# Patient Record
Sex: Male | Born: 1950 | Race: White | Hispanic: Yes | Marital: Married | Smoking: Never smoker
Health system: Southern US, Community
[De-identification: ages and names within clinical notes are randomized; demographics above are authoritative.]

## PROBLEM LIST (undated history)

## (undated) ENCOUNTER — Emergency Department (HOSPITAL_COMMUNITY): Discharge status: Absent without leave | Payer: Self-pay

## (undated) DIAGNOSIS — N186 End stage renal disease: Secondary | ICD-10-CM

## (undated) DIAGNOSIS — E785 Hyperlipidemia, unspecified: Secondary | ICD-10-CM

## (undated) DIAGNOSIS — Z992 Dependence on renal dialysis: Secondary | ICD-10-CM

## (undated) DIAGNOSIS — I10 Essential (primary) hypertension: Secondary | ICD-10-CM

## (undated) HISTORY — PX: APPENDECTOMY: SHX54

## (undated) HISTORY — PX: HERNIA REPAIR: SHX51

## (undated) HISTORY — DX: Hyperlipidemia, unspecified: E78.5

---

## 2010-06-20 ENCOUNTER — Ambulatory Visit: Payer: Self-pay | Admitting: Internal Medicine

## 2010-06-20 ENCOUNTER — Ambulatory Visit (HOSPITAL_COMMUNITY)
Admission: RE | Admit: 2010-06-20 | Discharge: 2010-06-20 | Payer: Self-pay | Source: Home / Self Care | Admitting: Internal Medicine

## 2010-06-20 ENCOUNTER — Encounter: Payer: Self-pay | Admitting: Gastroenterology

## 2010-06-20 DIAGNOSIS — K439 Ventral hernia without obstruction or gangrene: Secondary | ICD-10-CM | POA: Insufficient documentation

## 2010-06-20 LAB — CONVERTED CEMR LAB: Creatinine, Ser: 0.74 mg/dL (ref 0.40–1.50)

## 2010-06-21 ENCOUNTER — Telehealth (INDEPENDENT_AMBULATORY_CARE_PROVIDER_SITE_OTHER): Payer: Self-pay

## 2010-06-21 ENCOUNTER — Encounter: Payer: Self-pay | Admitting: Gastroenterology

## 2010-06-26 ENCOUNTER — Encounter (INDEPENDENT_AMBULATORY_CARE_PROVIDER_SITE_OTHER): Payer: Self-pay

## 2010-07-02 ENCOUNTER — Telehealth (INDEPENDENT_AMBULATORY_CARE_PROVIDER_SITE_OTHER): Payer: Self-pay

## 2010-07-02 ENCOUNTER — Encounter: Payer: Self-pay | Admitting: Internal Medicine

## 2010-07-11 ENCOUNTER — Ambulatory Visit (HOSPITAL_COMMUNITY)
Admission: RE | Admit: 2010-07-11 | Discharge: 2010-07-11 | Payer: Self-pay | Source: Home / Self Care | Attending: Internal Medicine | Admitting: Internal Medicine

## 2010-07-14 ENCOUNTER — Encounter: Payer: Self-pay | Admitting: Internal Medicine

## 2010-08-21 NOTE — Letter (Signed)
Summary: TCS ORDERS  TCS ORDERS   Imported By: Hoy Morn 06/20/2010 14:58:57  _____________________________________________________________________  External Attachment:    Type:   Image     Comment:   External Document

## 2010-08-21 NOTE — Letter (Signed)
Summary: REFERRAL FROM [facility]  REFERRAL FROM [facility]   Imported By: Hoy Morn 06/21/2010 15:59:32  _____________________________________________________________________  External Attachment:    Type:   Image     Comment:   External Document

## 2010-08-21 NOTE — Letter (Signed)
Summary: Plan of Care, Need to [facility]  [address]   [place], [place] [address]   Phone: [phone]  Fax: [phone]    June 26, 2010  [place] [address]. [place], [place]  [address] 1950-09-30   Dear Mr. HEAPHY,   We are writing this letter to inform you of treatment plans and/or discuss your plan of care.  We have tried several times to contact you; however, we have yet to reach you.  We ask that you please contact our office for follow-up on your gastrointestinal issues.  We can  be reached at [phone] to schedule an appointment, or to speak with someone regarding your health care needs.  Please do not neglect your health.   Sincerely,    Waldon Merl LPN  [facility] Ph: [phone]    Fax: [phone]

## 2010-08-21 NOTE — Letter (Signed)
Summary: CT ABD/PEL ORDER  CT ABD/PEL ORDER   Imported By: Hoy Morn 06/20/2010 14:55:28  _____________________________________________________________________  External Attachment:    Type:   Image     Comment:   External Document

## 2010-08-21 NOTE — Progress Notes (Signed)
Summary: PT DOES NOT SPEAK ENGLISH/ SPEAK WITH DAUGHTER [place]  Phone Note Call from Patient   Summary of Call: NOTE: PER DAUGHTER, [place], WE MUST SPEAK WITH HER [phone], HER FATHER DOES NOT SPEAK ENGLISH! Initial call taken by: Waldon Merl LPN,  December  1, 624THL 3:36 PM

## 2010-08-22 ENCOUNTER — Other Ambulatory Visit: Payer: Self-pay | Admitting: General Surgery

## 2010-08-22 ENCOUNTER — Encounter (HOSPITAL_COMMUNITY): Payer: Self-pay | Attending: General Surgery

## 2010-08-22 DIAGNOSIS — Z01818 Encounter for other preprocedural examination: Secondary | ICD-10-CM | POA: Insufficient documentation

## 2010-08-22 DIAGNOSIS — Z01812 Encounter for preprocedural laboratory examination: Secondary | ICD-10-CM | POA: Insufficient documentation

## 2010-08-22 DIAGNOSIS — Z01810 Encounter for preprocedural cardiovascular examination: Secondary | ICD-10-CM | POA: Insufficient documentation

## 2010-08-22 LAB — BASIC METABOLIC PANEL
BUN: 12 mg/dL (ref 6–23)
Calcium: 9.4 mg/dL (ref 8.4–10.5)
Creatinine, Ser: 0.69 mg/dL (ref 0.4–1.5)
GFR calc non Af Amer: >60 mL/min (ref >60)

## 2010-08-22 LAB — CBC
Platelets: 222 10*3/uL (ref 150–400)
RDW: 12.5 % (ref 11.5–15.5)
WBC: 7.1 10*3/uL (ref 4.0–10.5)

## 2010-08-22 LAB — SURGICAL PCR SCREEN
MRSA, PCR: NEGATIVE
Staphylococcus aureus: NEGATIVE

## 2010-08-23 NOTE — Progress Notes (Signed)
Summary: Daughter, [place], Call back number [phone]  Phone Note Call from Patient   Caller: Daughter Summary of Call: Pt's daughter called and was given results of CT and all of the info. (See Ct report dated 06/20/2010) Her call back number is [phone].  Initial call taken by: Waldon Merl LPN,  December 12, 624THL 9:12 AM

## 2010-08-23 NOTE — Letter (Signed)
Summary: SURGICAL REFERRAL  SURGICAL REFERRAL   Imported By: Sofie Rower 07/02/2010 10:33:34  _____________________________________________________________________  External Attachment:    Type:   Image     Comment:   External Document  Appended Document: SURGICAL REFERRAL pts daughter aware of appt

## 2010-08-23 NOTE — Assessment & Plan Note (Signed)
Summary: CONSULT FOR TCS/SS   Visit Type:  Initial Consult Referring Provider:  [facility] Primary Care Provider:  [facility]  Chief Complaint:  consult for TCS.  History of Present Illness: [name] is a pleasant non-English speaking Hispanic male who presents at the request of the [name] for further evaluation of large abd wall hernia and need for screening TCS.   He had emergency appendectomy at [name] about 13 years ago for ruptured appendectomy. According to his daughter who provides translation, he had significant abd wall defect from having to let the wound heal from inside out. Two years later, the same surgeon repaired the hernia but according to the patient, it never was quite right. Over the years, the hernia has continued to increase in size. He denies abd pain. States his BMs are regular without melena or brbpr. Denies n/v, dysphagia, GERD. He was recently diagnosed with DMs and has had little intentional weight loss. No prior TCS. Denies recent imaging of his abdomen.  Current Medications (verified): 1)  Aspir-Low 81 Mg Tbec (Aspirin) .... Take 1 Tablet By Mouth Once A Day 2)  Lisinopril 5 Mg Tabs (Lisinopril) .... Take 1 Tablet By Mouth Once A Day 3)  Metformin Hcl 500 Mg Xr24h-Tab (Metformin Hcl) .... Take 1 Tablet By Mouth Two Times A Day 4)  Fish Oil .... One Tablet Twice Daily  Allergies (verified): No Known Drug Allergies  Past History:  Past Medical History: Diabetes Hypertension Hyperlipidemia  Past Surgical History: Appendectomy, ruptured appendix 13 years ago, did not close wound Hernia Surgery, 11 years ago, repaired by same surgeon   Family History: Negative for CRC, liver disease.  Social History: Married. Five children. Unemployed. Never smoked. No alcohol. No drug use.   Review of Systems General:  Denies fever, chills, sweats, anorexia, fatigue, weakness, and weight loss. Eyes:  Denies vision loss. ENT:  Denies  nasal congestion, sore throat, hoarseness, and difficulty swallowing. CV:  Denies chest pains, angina, palpitations, dyspnea on exertion, and peripheral edema. Resp:  Denies dyspnea at rest, dyspnea with exercise, cough, sputum, and wheezing. GI:  See HPI. GU:  Denies urinary burning and blood in urine. MS:  Denies joint pain / LOM and low back pain. Derm:  Denies rash and itching. Neuro:  Denies weakness, frequent headaches, memory loss, and confusion. Psych:  Denies depression and anxiety. Endo:  Denies unusual weight change. Heme:  Denies bruising and bleeding. Allergy:  Denies hives and rash.  Vital Signs:  Patient profile:   60 year old male Height:      69 inches Weight:      222 pounds BMI:     32.90 Temp:     97.5 degrees F oral Pulse rate:   64 / minute BP sitting:   140 / 98  (left arm) Cuff size:   regular  Vitals Entered By: Michael Merl LPN (November 30, 624THL 1:20 PM)  Physical Exam  General:  Well developed, well nourished, no acute distress. Head:  Normocephalic and atraumatic. Eyes:  Conjunctivae pink, no scleral icterus.  Mouth:  Oropharyngeal mucosa moist, pink.  No lesions, erythema or exudate.    Neck:  Supple; no masses or thyromegaly. Lungs:  Clear throughout to auscultation. Heart:  Regular rate and rhythm; no murmurs, rubs,  or bruits. Abdomen:  Large melon sized herniation in right lower abdomen above appendectomy scar. Some increased induration of this area, not as soft as remainder of abdomen. NT. No HSM. No  abd bruit. No rebound or guarding.  Rectal:  deferred until time of colonoscopy.   Extremities:  No clubbing, cyanosis, edema or deformities noted. Neurologic:  Alert and  oriented x4;  grossly normal neurologically. Skin:  Intact without significant lesions or rashes. Cervical Nodes:  No significant cervical adenopathy. Psych:  Alert and cooperative. Normal mood and affect.  Impression & Recommendations:  Problem # 1:  ABDOMINAL WALL  HERNIA (ICD-553.20)  Large abd wall hernia, which is increasing in size per patient. No complaints of pain. Discussed with Dr. Gala Romney. Prior to TCS, will get CT A/P with IV/oral contrast for further evaluation.   Orders: Consultation Level III [id number])  Problem # 2:  SCREENING COLORECTAL-CANCER (ICD-V76.51)  Colonoscopy to be performed in near future.  Risks, alternatives, and benefits including but not limited to the risk of reaction to medication, bleeding, infection, and perforation were addressed.  Patient voiced understanding and provided verbal consent. To be done after CT, as outlined in #1. Day of prep, will decrease metformin by 1/2. Continue ASA.   Orders: Consultation Level III [phone]) I would like to thank the [facility] for allowing [place] to take part in the care of this nice patient.

## 2010-08-23 NOTE — Letter (Signed)
Summary: Patient Notice, Colon Biopsy Results  [facility]  [address]   [place], [place] [address]   Phone: [phone]  Fax: [phone]       July 14, 2010   [place]. [place], [place]  [address] [birth year]    Dear Mr. DOMBROWSKI,  I am pleased to inform you that the biopsies taken during your recent colonoscopy did not show any evidence of cancer upon pathologic examination.  Additional information/recommendations:  No further action is needed at this time.  Please follow-up with your primary care physician for your other healthcare needs.  Continue with the treatment plan as outlined on the day of your exam.  You should have a repeat colonoscopy examination  in 7 years.  Please call us if you are having persistent problems or have questions about your condition that have not been fully answered at this time.  Sincerely,    R. Garfield Cornea MD, [facility] Ph: [phone]    Fax: [phone]   Appended Document: Patient Notice, Colon Biopsy Results Letter mailed.

## 2010-08-29 ENCOUNTER — Inpatient Hospital Stay (HOSPITAL_COMMUNITY)
Admission: RE | Admit: 2010-08-29 | Discharge: 2010-09-02 | Disposition: A | Discharge status: Other Acute Inpatient Hospital | Payer: Medicaid Other | Source: Ambulatory Visit | Attending: General Surgery | Admitting: General Surgery

## 2010-08-29 ENCOUNTER — Observation Stay (HOSPITAL_COMMUNITY)
Admission: RE | Admit: 2010-08-29 | Discharge: 2010-08-29 | Disposition: A | Discharge status: Home / Self Care | Payer: Medicaid Other | Source: Ambulatory Visit | Attending: General Surgery | Admitting: General Surgery

## 2010-08-29 ENCOUNTER — Observation Stay (HOSPITAL_COMMUNITY): Payer: Medicaid Other

## 2010-08-29 DIAGNOSIS — Z7982 Long term (current) use of aspirin: Secondary | ICD-10-CM

## 2010-08-29 DIAGNOSIS — E43 Unspecified severe protein-calorie malnutrition: Secondary | ICD-10-CM | POA: Diagnosis not present

## 2010-08-29 DIAGNOSIS — Z01812 Encounter for preprocedural laboratory examination: Secondary | ICD-10-CM

## 2010-08-29 DIAGNOSIS — K631 Perforation of intestine (nontraumatic): Secondary | ICD-10-CM | POA: Diagnosis not present

## 2010-08-29 DIAGNOSIS — K659 Peritonitis, unspecified: Secondary | ICD-10-CM | POA: Diagnosis not present

## 2010-08-29 DIAGNOSIS — I10 Essential (primary) hypertension: Secondary | ICD-10-CM | POA: Diagnosis present

## 2010-08-29 DIAGNOSIS — E875 Hyperkalemia: Secondary | ICD-10-CM | POA: Diagnosis not present

## 2010-08-29 DIAGNOSIS — A419 Sepsis, unspecified organism: Secondary | ICD-10-CM | POA: Diagnosis not present

## 2010-08-29 DIAGNOSIS — I4891 Unspecified atrial fibrillation: Secondary | ICD-10-CM | POA: Diagnosis not present

## 2010-08-29 DIAGNOSIS — O85 Puerperal sepsis: Secondary | ICD-10-CM

## 2010-08-29 DIAGNOSIS — E871 Hypo-osmolality and hyponatremia: Secondary | ICD-10-CM | POA: Diagnosis not present

## 2010-08-29 DIAGNOSIS — E119 Type 2 diabetes mellitus without complications: Secondary | ICD-10-CM | POA: Diagnosis present

## 2010-08-29 DIAGNOSIS — R0902 Hypoxemia: Secondary | ICD-10-CM | POA: Diagnosis not present

## 2010-08-29 DIAGNOSIS — T8579XA Infection and inflammatory reaction due to other internal prosthetic devices, implants and grafts, initial encounter: Secondary | ICD-10-CM | POA: Diagnosis not present

## 2010-08-29 DIAGNOSIS — K660 Peritoneal adhesions (postprocedural) (postinfection): Secondary | ICD-10-CM | POA: Diagnosis not present

## 2010-08-29 DIAGNOSIS — R5381 Other malaise: Secondary | ICD-10-CM | POA: Diagnosis not present

## 2010-08-29 DIAGNOSIS — R197 Diarrhea, unspecified: Secondary | ICD-10-CM | POA: Diagnosis not present

## 2010-08-29 DIAGNOSIS — J156 Pneumonia due to other aerobic Gram-negative bacteria: Secondary | ICD-10-CM | POA: Diagnosis not present

## 2010-08-29 DIAGNOSIS — E870 Hyperosmolality and hypernatremia: Secondary | ICD-10-CM | POA: Diagnosis not present

## 2010-08-29 DIAGNOSIS — N179 Acute kidney failure, unspecified: Secondary | ICD-10-CM | POA: Diagnosis not present

## 2010-08-29 DIAGNOSIS — J9819 Other pulmonary collapse: Secondary | ICD-10-CM | POA: Diagnosis not present

## 2010-08-29 DIAGNOSIS — Y832 Surgical operation with anastomosis, bypass or graft as the cause of abnormal reaction of the patient, or of later complication, without mention of misadventure at the time of the procedure: Secondary | ICD-10-CM | POA: Diagnosis not present

## 2010-08-29 DIAGNOSIS — Z01810 Encounter for preprocedural cardiovascular examination: Secondary | ICD-10-CM

## 2010-08-29 DIAGNOSIS — E872 Acidosis, unspecified: Secondary | ICD-10-CM | POA: Diagnosis not present

## 2010-08-29 DIAGNOSIS — K432 Incisional hernia without obstruction or gangrene: Principal | ICD-10-CM | POA: Diagnosis present

## 2010-08-29 DIAGNOSIS — Z8249 Family history of ischemic heart disease and other diseases of the circulatory system: Secondary | ICD-10-CM

## 2010-08-29 DIAGNOSIS — K560 Paralytic ileus: Secondary | ICD-10-CM | POA: Diagnosis not present

## 2010-08-29 DIAGNOSIS — R6521 Severe sepsis with septic shock: Secondary | ICD-10-CM | POA: Diagnosis not present

## 2010-08-29 DIAGNOSIS — I498 Other specified cardiac arrhythmias: Secondary | ICD-10-CM | POA: Diagnosis present

## 2010-08-29 DIAGNOSIS — J9600 Acute respiratory failure, unspecified whether with hypoxia or hypercapnia: Secondary | ICD-10-CM | POA: Diagnosis not present

## 2010-08-29 DIAGNOSIS — Y9210 Unspecified residential institution as the place of occurrence of the external cause: Secondary | ICD-10-CM | POA: Diagnosis not present

## 2010-08-29 DIAGNOSIS — I4892 Unspecified atrial flutter: Secondary | ICD-10-CM | POA: Diagnosis not present

## 2010-08-29 DIAGNOSIS — D62 Acute posthemorrhagic anemia: Secondary | ICD-10-CM | POA: Diagnosis not present

## 2010-08-29 DIAGNOSIS — R404 Transient alteration of awareness: Secondary | ICD-10-CM | POA: Diagnosis not present

## 2010-08-29 DIAGNOSIS — J1569 Pneumonia due to other gram-negative bacteria: Secondary | ICD-10-CM | POA: Diagnosis not present

## 2010-08-29 LAB — COMPREHENSIVE METABOLIC PANEL
AST: 22 U/L (ref 0–37)
Albumin: 3.1 g/dL — ABNORMAL LOW (ref 3.5–5.2)
Alkaline Phosphatase: 55 U/L (ref 39–117)
Alkaline Phosphatase: 54 U/L (ref 39–117)
BUN: 18 mg/dL (ref 6–23)
Chloride: 103 mEq/L (ref 96–112)
GFR calc Af Amer: >60 mL/min (ref >60)
GFR calc non Af Amer: >60 mL/min (ref >60)
Glucose, Bld: 219 mg/dL — ABNORMAL HIGH (ref 70–99)
Potassium: 4.5 mEq/L (ref 3.5–5.1)
Potassium: 3.7 mEq/L (ref 3.5–5.1)
Sodium: 139 mEq/L (ref 135–145)
Total Bilirubin: 0.6 mg/dL (ref 0.3–1.2)
Total Bilirubin: 0.8 mg/dL (ref 0.3–1.2)
Total Protein: 6.4 g/dL (ref 6.0–8.3)

## 2010-08-29 LAB — GLUCOSE, CAPILLARY: Glucose-Capillary: 186 mg/dL — ABNORMAL HIGH (ref 70–99)

## 2010-08-29 LAB — CARDIAC PANEL(CRET KIN+CKTOT+MB+TROPI)
CK, MB: 3.1 ng/mL (ref 0.3–4.0)
CK, MB: 2 ng/mL (ref 0.3–4.0)
Relative Index: 1.6 (ref 0.0–2.5)

## 2010-08-29 LAB — CBC
MCV: 89.4 fL (ref 78.0–100.0)
Platelets: 171 10*3/uL (ref 150–400)
RDW: 12.5 % (ref 11.5–15.5)
WBC: 11.7 10*3/uL — ABNORMAL HIGH (ref 4.0–10.5)

## 2010-08-29 LAB — PHOSPHORUS: Phosphorus: 4.1 mg/dL (ref 2.3–4.6)

## 2010-08-29 LAB — DIFFERENTIAL
Basophils Absolute: 0 10*3/uL (ref 0.0–0.1)
Eosinophils Absolute: 0 10*3/uL (ref 0.0–0.7)
Eosinophils Relative: 0 % (ref 0–5)
Lymphs Abs: 1.2 10*3/uL (ref 0.7–4.0)

## 2010-08-29 LAB — MAGNESIUM
Magnesium: 1.7 mg/dL (ref 1.5–2.5)
Magnesium: 1.4 mg/dL — ABNORMAL LOW (ref 1.5–2.5)

## 2010-08-30 ENCOUNTER — Observation Stay (HOSPITAL_COMMUNITY): Payer: Medicaid Other

## 2010-08-30 DIAGNOSIS — I319 Disease of pericardium, unspecified: Secondary | ICD-10-CM

## 2010-08-30 LAB — GLUCOSE, CAPILLARY
Glucose-Capillary: 224 mg/dL — ABNORMAL HIGH (ref 70–99)
Glucose-Capillary: 230 mg/dL — ABNORMAL HIGH (ref 70–99)
Glucose-Capillary: 196 mg/dL — ABNORMAL HIGH (ref 70–99)

## 2010-08-30 LAB — BASIC METABOLIC PANEL
Chloride: 103 mEq/L (ref 96–112)
Creatinine, Ser: 0.91 mg/dL (ref 0.4–1.5)
GFR calc Af Amer: >60 mL/min (ref >60)
GFR calc non Af Amer: >60 mL/min (ref >60)
Potassium: 3.8 mEq/L (ref 3.5–5.1)

## 2010-08-30 LAB — CARDIAC PANEL(CRET KIN+CKTOT+MB+TROPI)
CK, MB: 1.2 ng/mL (ref 0.3–4.0)
Relative Index: 0.8 (ref 0.0–2.5)
Total CK: 150 U/L (ref 7–232)

## 2010-08-30 LAB — TSH: TSH: 3.547 u[IU]/mL (ref 0.350–4.500)

## 2010-08-30 LAB — HEMOGLOBIN A1C
Hgb A1c MFr Bld: 8.2 % — ABNORMAL HIGH (ref <5.7)
Mean Plasma Glucose: 189 mg/dL — ABNORMAL HIGH (ref <117)

## 2010-08-30 LAB — CBC
MCH: 32.1 pg (ref 26.0–34.0)
Platelets: 189 10*3/uL (ref 150–400)
RBC: 4.96 MIL/uL (ref 4.22–5.81)
RDW: 12.8 % (ref 11.5–15.5)
WBC: 10.9 10*3/uL — ABNORMAL HIGH (ref 4.0–10.5)

## 2010-08-30 MED ORDER — IOHEXOL 350 MG/ML SOLN
100.0000 mL | Freq: Once | INTRAVENOUS | Status: AC | PRN
  Administered 2010-08-30: 100 mL via INTRAVENOUS

## 2010-08-31 ENCOUNTER — Inpatient Hospital Stay (HOSPITAL_COMMUNITY): Payer: Medicaid Other

## 2010-08-31 DIAGNOSIS — I517 Cardiomegaly: Secondary | ICD-10-CM

## 2010-08-31 DIAGNOSIS — I4891 Unspecified atrial fibrillation: Secondary | ICD-10-CM

## 2010-08-31 LAB — BLOOD GAS, ARTERIAL
Bicarbonate: 23.2 mEq/L (ref 20.0–24.0)
FIO2: 50 %
O2 Saturation: 84.7 %
Patient temperature: 37
TCO2: 20.1 mmol/L (ref 0–100)
pH, Arterial: 7.414 (ref 7.350–7.450)

## 2010-08-31 LAB — CARDIAC PANEL(CRET KIN+CKTOT+MB+TROPI)
CK, MB: 5.7 ng/mL — ABNORMAL HIGH (ref 0.3–4.0)
Relative Index: 2.2 (ref 0.0–2.5)
Total CK: 273 U/L — ABNORMAL HIGH (ref 7–232)
Troponin I: 0.01 ng/mL (ref 0.00–0.06)
Troponin I: 0.01 ng/mL (ref 0.00–0.06)

## 2010-08-31 LAB — CBC
Hemoglobin: 15.1 g/dL (ref 13.0–17.0)
MCH: 32.2 pg (ref 26.0–34.0)
MCHC: 36 g/dL (ref 30.0–36.0)
RDW: 12.9 % (ref 11.5–15.5)

## 2010-08-31 LAB — HEPATIC FUNCTION PANEL
Bilirubin, Direct: 0.3 mg/dL (ref 0.0–0.3)
Indirect Bilirubin: 0.5 mg/dL (ref 0.3–0.9)

## 2010-08-31 LAB — BASIC METABOLIC PANEL
CO2: 23 mEq/L (ref 19–32)
Calcium: 7.8 mg/dL — ABNORMAL LOW (ref 8.4–10.5)
Creatinine, Ser: 1.09 mg/dL (ref 0.4–1.5)
GFR calc Af Amer: >60 mL/min (ref >60)

## 2010-08-31 LAB — GLUCOSE, CAPILLARY
Glucose-Capillary: 164 mg/dL — ABNORMAL HIGH (ref 70–99)
Glucose-Capillary: 173 mg/dL — ABNORMAL HIGH (ref 70–99)

## 2010-08-31 LAB — DIFFERENTIAL
Basophils Relative: 0 % (ref 0–1)
Eosinophils Absolute: 0 10*3/uL (ref 0.0–0.7)
Eosinophils Relative: 0 % (ref 0–5)
Monocytes Absolute: 0.6 10*3/uL (ref 0.1–1.0)
Monocytes Relative: 5 % (ref 3–12)

## 2010-09-01 LAB — BASIC METABOLIC PANEL
BUN: 28 mg/dL — ABNORMAL HIGH (ref 6–23)
CO2: 25 mEq/L (ref 19–32)
Chloride: 101 mEq/L (ref 96–112)
Glucose, Bld: 176 mg/dL — ABNORMAL HIGH (ref 70–99)
Potassium: 4 mEq/L (ref 3.5–5.1)

## 2010-09-01 LAB — LIPID PANEL
HDL: 43 mg/dL (ref >39)
Total CHOL/HDL Ratio: 3 RATIO

## 2010-09-01 LAB — GLUCOSE, CAPILLARY
Glucose-Capillary: 187 mg/dL — ABNORMAL HIGH (ref 70–99)
Glucose-Capillary: 175 mg/dL — ABNORMAL HIGH (ref 70–99)

## 2010-09-01 LAB — DIFFERENTIAL
Basophils Absolute: 0 10*3/uL (ref 0.0–0.1)
Basophils Relative: 0 % (ref 0–1)
Neutro Abs: 10.1 10*3/uL — ABNORMAL HIGH (ref 1.7–7.7)
Neutrophils Relative %: 90 % — ABNORMAL HIGH (ref 43–77)

## 2010-09-01 LAB — CARDIAC PANEL(CRET KIN+CKTOT+MB+TROPI)
Relative Index: 2 (ref 0.0–2.5)
Total CK: 216 U/L (ref 7–232)
Troponin I: <0.01 ng/mL (ref 0.00–0.06)

## 2010-09-01 LAB — CBC
Hemoglobin: 13.9 g/dL (ref 13.0–17.0)
MCHC: 36.1 g/dL — ABNORMAL HIGH (ref 30.0–36.0)
Platelets: 165 10*3/uL (ref 150–400)
RBC: 4.38 MIL/uL (ref 4.22–5.81)

## 2010-09-02 ENCOUNTER — Inpatient Hospital Stay (HOSPITAL_COMMUNITY): Payer: Medicaid Other

## 2010-09-02 ENCOUNTER — Inpatient Hospital Stay (HOSPITAL_COMMUNITY)
Admission: RE | Admit: 2010-09-02 | Discharge: 2010-10-03 | DRG: 329 | Disposition: A | Discharge status: Home Health | Payer: Medicaid Other | Source: Other Acute Inpatient Hospital | Attending: Internal Medicine | Admitting: Internal Medicine

## 2010-09-02 DIAGNOSIS — A419 Sepsis, unspecified organism: Secondary | ICD-10-CM

## 2010-09-02 DIAGNOSIS — R6521 Severe sepsis with septic shock: Secondary | ICD-10-CM

## 2010-09-02 DIAGNOSIS — N1330 Unspecified hydronephrosis: Secondary | ICD-10-CM

## 2010-09-02 DIAGNOSIS — I4891 Unspecified atrial fibrillation: Secondary | ICD-10-CM

## 2010-09-02 HISTORY — DX: Essential (primary) hypertension: I10

## 2010-09-02 LAB — DIFFERENTIAL
Basophils Relative: 0 % (ref 0–1)
Monocytes Relative: 4 % (ref 3–12)
Neutro Abs: 11.8 10*3/uL — ABNORMAL HIGH (ref 1.7–7.7)
Neutrophils Relative %: 89 % — ABNORMAL HIGH (ref 43–77)

## 2010-09-02 LAB — POCT I-STAT 3, ART BLOOD GAS (G3+)
Acid-Base Excess: 3 mmol/L — ABNORMAL HIGH (ref 0.0–2.0)
O2 Saturation: 95 %
Patient temperature: 98.8
pCO2 arterial: 38.1 mmHg (ref 35.0–45.0)
pCO2 arterial: 46.6 mmHg — ABNORMAL HIGH (ref 35.0–45.0)
pH, Arterial: 7.502 — ABNORMAL HIGH (ref 7.350–7.450)
pH, Arterial: 7.415 (ref 7.350–7.450)
pO2, Arterial: 61 mmHg — ABNORMAL LOW (ref 80.0–100.0)

## 2010-09-02 LAB — BLOOD GAS, ARTERIAL
Acid-Base Excess: 2.7 mmol/L — ABNORMAL HIGH (ref 0.0–2.0)
Bicarbonate: 26.2 mEq/L — ABNORMAL HIGH (ref 20.0–24.0)
FIO2: 100 %
O2 Saturation: 91.3 %
TCO2: 22.6 mmol/L (ref 0–100)
pO2, Arterial: 60.7 mmHg — ABNORMAL LOW (ref 80.0–100.0)

## 2010-09-02 LAB — URINALYSIS, MICROSCOPIC ONLY
Hgb urine dipstick: NEGATIVE
Leukocytes, UA: NEGATIVE
Protein, ur: 30 mg/dL — AB
Urine Glucose, Fasting: 250 mg/dL — AB
pH: 6 (ref 5.0–8.0)

## 2010-09-02 LAB — CBC
HCT: 37.8 % — ABNORMAL LOW (ref 39.0–52.0)
Hemoglobin: 14.8 g/dL (ref 13.0–17.0)
Hemoglobin: 13.5 g/dL (ref 13.0–17.0)
MCH: 32.1 pg (ref 26.0–34.0)
MCH: 32.5 pg (ref 26.0–34.0)
MCV: 91.1 fL (ref 78.0–100.0)
RBC: 4.61 MIL/uL (ref 4.22–5.81)
RBC: 4.15 MIL/uL — ABNORMAL LOW (ref 4.22–5.81)

## 2010-09-02 LAB — HEPATIC FUNCTION PANEL
AST: 21 U/L (ref 0–37)
Alkaline Phosphatase: 50 U/L (ref 39–117)
Bilirubin, Direct: 0.2 mg/dL (ref 0.0–0.3)
Total Bilirubin: 0.7 mg/dL (ref 0.3–1.2)

## 2010-09-02 LAB — APTT: aPTT: 35 seconds (ref 24–37)

## 2010-09-02 LAB — PHOSPHORUS: Phosphorus: 3.5 mg/dL (ref 2.3–4.6)

## 2010-09-02 LAB — BASIC METABOLIC PANEL
CO2: 24 mEq/L (ref 19–32)
Calcium: 7.6 mg/dL — ABNORMAL LOW (ref 8.4–10.5)
Chloride: 99 mEq/L (ref 96–112)
Creatinine, Ser: 1.23 mg/dL (ref 0.4–1.5)
GFR calc Af Amer: >60 mL/min (ref >60)
GFR calc non Af Amer: >60 mL/min (ref >60)
GFR calc non Af Amer: 60 mL/min — ABNORMAL LOW (ref >60)
Glucose, Bld: 215 mg/dL — ABNORMAL HIGH (ref 70–99)
Glucose, Bld: 261 mg/dL — ABNORMAL HIGH (ref 70–99)
Potassium: 3.9 mEq/L (ref 3.5–5.1)
Sodium: 134 mEq/L — ABNORMAL LOW (ref 135–145)
Sodium: 134 mEq/L — ABNORMAL LOW (ref 135–145)

## 2010-09-02 LAB — CARDIAC PANEL(CRET KIN+CKTOT+MB+TROPI)
Relative Index: 2.2 (ref 0.0–2.5)
Relative Index: INVALID (ref 0.0–2.5)
Total CK: 121 U/L (ref 7–232)
Total CK: 62 U/L (ref 7–232)
Troponin I: 0.01 ng/mL (ref 0.00–0.06)

## 2010-09-02 LAB — PROTIME-INR: INR: 1.08 (ref 0.00–1.49)

## 2010-09-02 LAB — GLUCOSE, CAPILLARY
Glucose-Capillary: 213 mg/dL — ABNORMAL HIGH (ref 70–99)
Glucose-Capillary: 197 mg/dL — ABNORMAL HIGH (ref 70–99)

## 2010-09-02 NOTE — H&P (Addendum)
  [name], [name]              ACCOUNT NO.:  [id number]  MEDICAL RECORD NO.:  [id number]          PATIENT TYPE:  AMB  LOCATION:  DAY                           FACILITY:  [facility]  PHYSICIAN:  Jamesetta So, M.D.  DATE OF BIRTH:  [birth year]  DATE OF ADMISSION:  08/29/2010 DATE OF DISCHARGE:  LH                             HISTORY & PHYSICAL   CHIEF COMPLAINT:  Recurrent incisional hernia.  HISTORY OF PRESENT ILLNESS:  The patient is a 60 year old Hispanic male who is referred for evaluation and treatment of an abdominal wall hernia.  He is status post an appendectomy in the remote past with mesh repair of an incisional hernia by Dr. Lindalou Hose.  Swelling has recurred in this area.  No nausea or vomiting have been noted.  PAST MEDICAL HISTORY:  Unremarkable.  PAST SURGICAL HISTORY:  As noted above.  CURRENT MEDICATIONS:  None.  ALLERGIES:  No known drug allergies.  REVIEW OF SYSTEMS:  The patient denies drinking or smoking.  He denies any other cardiopulmonary difficulties or bleeding disorders.  FAMILY MEDICAL HISTORY:  Noncontributory.  PHYSICAL EXAMINATION:  GENERAL:  The patient is a well-developed, well- nourished Hispanic male, in no acute distress. HEENT:  Unremarkable. LUNGS:  Clear to auscultation with equal breath sounds bilaterally. HEART:  Reveals regular rate and rhythm without S3, S4, or murmurs. ABDOMEN:  Soft, nontender, nondistended.  No hepatosplenomegaly or masses are noted.  Reducible right lower quadrant incisional hernia is present.  The hernia is present along the superior aspect of transverse right lower quadrant incision with mesh palpable.  IMPRESSION:  Recurrent incisional hernia.  PLAN:  The patient is scheduled for laparoscopic recurrent incisional herniorrhaphy with mesh on August 29, 2010.  The risks and benefits of the procedure including bleeding, infection, and the possibility recurrence of the hernia were fully explained to the  patient, gave informed consent.     Jamesetta So, M.D.     MAJ/MEDQ  D:  08/16/2010  T:  08/17/2010  Job:  [id number]  cc:   Short Stay, [facility]  R. Garfield Cornea, M.D. [address] [place] [address]  Electronically Signed by Aviva Signs M.D. on 08/31/2010 01:24:39 PM

## 2010-09-02 NOTE — Op Note (Addendum)
[name], [name]              ACCOUNT NO.:  [id number]  MEDICAL RECORD NO.:  [id number]           PATIENT TYPE:  I  LOCATION:  IC03                          FACILITY:  [facility]  PHYSICIAN:  Jamesetta So, M.D.  DATE OF BIRTH:  1950/09/16  DATE OF PROCEDURE:  08/29/2010 DATE OF DISCHARGE:                              OPERATIVE REPORT   PREOPERATIVE DIAGNOSIS:  Recurrent incisional hernia.  POSTOPERATIVE DIAGNOSIS:  Recurrent incisional hernia.  PROCEDURE:  Recurrent incisional herniorrhaphy with mesh, laparoscopic.  SURGEON:  Jamesetta So, MD  ANESTHESIA:  General endotracheal.  INDICATIONS:  The patient is a 60 year old Hispanic male who is referred for evaluation and treatment of abdominal wall hernia, status post an open appendectomy in the remote past with mesh repair for an incisional hernia.  This has since failed and the patient now comes to the operating room for a laparoscopic recurrent incisional herniorrhaphy with mesh.  The risks and benefits of the procedure including bleeding, infection, and the possibility of an open procedure as well as bowel injury were fully explained to the patient, gave informed consent.  PROCEDURE NOTE:  The patient was placed in supine position.  After induction of general endotracheal anesthesia, the abdomen was prepped and draped using the usual sterile technique with DuraPrep.  Surgical site confirmation was performed.  A Veress needle was introduced into the epigastric space into the peritoneal cavity without difficulty.  Confirmation of placement to the perineum was done using the saline drop test.  The abdomen was then insufflated to 16 mmHg pressure.  An 11-mm trocar was induced into the left upper quadrant abdominal cavity under direct visualization without difficulty.  This was likewise done in the right upper quadrant and left lower quadrant regions with an 11-mm trocars.  The patient was noted to have a recurrent  incisional hernia with mesh adhesed to the previously placed mesh.  This was bluntly and sharply dissected free from the previous mesh and hernia sac.  This was an extensive process.  A small bowel serosal tear was found and this was repaired using a 2-0 silk suture laparoscopically.  Once the hernia contents were reduced, a 10 x 15 cm physio mesh was then secured to the underside of the abdominal wall using the protractor as well as 0 Prolene interrupted sutures in 4 quadrants.  The bowel was again inspected and there was no evidence of an enterotomy.  All fluid and air were then evacuated from the abdominal cavity prior to the removal of the trocars.  All wounds were irrigated with normal saline.  All wounds were injected with 0.5% Sensorcaine.  The incisions were closed using staples. Betadine ointment and dry sterile dressings were applied.  All tape and needle counts were correct at the end of the procedure. The patient was extubated in the operating room and went back to recovery room awake in stable condition.  COMPLICATIONS:  None.  SPECIMEN:  None.  BLOOD LOSS:  Minimal.     Jamesetta So, M.D.     MAJ/MEDQ  D:  08/29/2010  T:  08/30/2010  Job:  [id number]  cc:  Bridgette Habermann, M.D. [address] [place] [address]  [facility]  Electronically Signed by Aviva Signs M.D. on 08/31/2010 01:24:41 PM

## 2010-09-03 ENCOUNTER — Inpatient Hospital Stay (HOSPITAL_COMMUNITY): Payer: Medicaid Other

## 2010-09-03 ENCOUNTER — Encounter (HOSPITAL_COMMUNITY): Payer: Self-pay

## 2010-09-03 DIAGNOSIS — A419 Sepsis, unspecified organism: Secondary | ICD-10-CM

## 2010-09-03 DIAGNOSIS — I4891 Unspecified atrial fibrillation: Secondary | ICD-10-CM

## 2010-09-03 DIAGNOSIS — J9600 Acute respiratory failure, unspecified whether with hypoxia or hypercapnia: Secondary | ICD-10-CM

## 2010-09-03 LAB — CBC
HCT: 37.5 % — ABNORMAL LOW (ref 39.0–52.0)
MCHC: 35.5 g/dL (ref 30.0–36.0)
Platelets: 192 10*3/uL (ref 150–400)
RDW: 13.4 % (ref 11.5–15.5)
WBC: 8.5 10*3/uL (ref 4.0–10.5)

## 2010-09-03 LAB — GLUCOSE, CAPILLARY
Glucose-Capillary: 115 mg/dL — ABNORMAL HIGH (ref 70–99)
Glucose-Capillary: 118 mg/dL — ABNORMAL HIGH (ref 70–99)
Glucose-Capillary: 125 mg/dL — ABNORMAL HIGH (ref 70–99)
Glucose-Capillary: 133 mg/dL — ABNORMAL HIGH (ref 70–99)
Glucose-Capillary: 133 mg/dL — ABNORMAL HIGH (ref 70–99)
Glucose-Capillary: 136 mg/dL — ABNORMAL HIGH (ref 70–99)
Glucose-Capillary: 139 mg/dL — ABNORMAL HIGH (ref 70–99)
Glucose-Capillary: 141 mg/dL — ABNORMAL HIGH (ref 70–99)
Glucose-Capillary: 149 mg/dL — ABNORMAL HIGH (ref 70–99)
Glucose-Capillary: 150 mg/dL — ABNORMAL HIGH (ref 70–99)
Glucose-Capillary: 150 mg/dL — ABNORMAL HIGH (ref 70–99)
Glucose-Capillary: 150 mg/dL — ABNORMAL HIGH (ref 70–99)

## 2010-09-03 LAB — COMPREHENSIVE METABOLIC PANEL
ALT: 17 U/L (ref 0–53)
AST: 20 U/L (ref 0–37)
Albumin: 2.1 g/dL — ABNORMAL LOW (ref 3.5–5.2)
Calcium: 7.6 mg/dL — ABNORMAL LOW (ref 8.4–10.5)
Creatinine, Ser: 1.14 mg/dL (ref 0.4–1.5)
GFR calc Af Amer: >60 mL/min (ref >60)
GFR calc non Af Amer: >60 mL/min (ref >60)
Sodium: 137 mEq/L (ref 135–145)
Total Protein: 5.7 g/dL — ABNORMAL LOW (ref 6.0–8.3)

## 2010-09-03 LAB — POCT I-STAT 3, ART BLOOD GAS (G3+)
Acid-Base Excess: 4 mmol/L — ABNORMAL HIGH (ref 0.0–2.0)
O2 Saturation: 94 %
Patient temperature: 99.4
TCO2: 28 mmol/L (ref 0–100)
pCO2 arterial: 36.7 mmHg (ref 35.0–45.0)
pCO2 arterial: 36.1 mmHg (ref 35.0–45.0)
pH, Arterial: 7.453 — ABNORMAL HIGH (ref 7.350–7.450)

## 2010-09-03 LAB — CARDIAC PANEL(CRET KIN+CKTOT+MB+TROPI)
CK, MB: 1 ng/mL (ref 0.3–4.0)
CK, MB: 0.8 ng/mL (ref 0.3–4.0)
Relative Index: INVALID (ref 0.0–2.5)
Relative Index: INVALID (ref 0.0–2.5)
Total CK: 40 U/L (ref 7–232)
Troponin I: <0.01 ng/mL (ref 0.00–0.06)
Troponin I: 0.02 ng/mL (ref 0.00–0.06)

## 2010-09-03 LAB — HEPARIN LEVEL (UNFRACTIONATED)
Heparin Unfractionated: <0.1 IU/mL — ABNORMAL LOW (ref 0.30–0.70)
Heparin Unfractionated: <0.1 IU/mL — ABNORMAL LOW (ref 0.30–0.70)
Heparin Unfractionated: 0.46 IU/mL (ref 0.30–0.70)

## 2010-09-03 MED ORDER — IOHEXOL 300 MG/ML  SOLN
100.0000 mL | Freq: Once | INTRAMUSCULAR | Status: AC | PRN
  Administered 2010-09-03: 100 mL via INTRAVENOUS

## 2010-09-04 ENCOUNTER — Other Ambulatory Visit: Payer: Self-pay | Admitting: Surgery

## 2010-09-04 ENCOUNTER — Inpatient Hospital Stay (HOSPITAL_COMMUNITY): Payer: Medicaid Other

## 2010-09-04 LAB — POCT I-STAT 3, ART BLOOD GAS (G3+)
Acid-base deficit: 6 mmol/L — ABNORMAL HIGH (ref 0.0–2.0)
O2 Saturation: 96 %
pCO2 arterial: 31.5 mmHg — ABNORMAL LOW (ref 35.0–45.0)
pO2, Arterial: 85 mmHg (ref 80.0–100.0)

## 2010-09-04 LAB — CBC
Hemoglobin: 13.5 g/dL (ref 13.0–17.0)
MCH: 31.7 pg (ref 26.0–34.0)
MCHC: 34.5 g/dL (ref 30.0–36.0)
MCV: 90.4 fL (ref 78.0–100.0)
Platelets: 219 10*3/uL (ref 150–400)
RBC: 4.16 MIL/uL — ABNORMAL LOW (ref 4.22–5.81)
RDW: 13.6 % (ref 11.5–15.5)
RDW: 13.7 % (ref 11.5–15.5)
WBC: 12.6 10*3/uL — ABNORMAL HIGH (ref 4.0–10.5)

## 2010-09-04 LAB — COMPREHENSIVE METABOLIC PANEL
AST: 21 U/L (ref 0–37)
Albumin: 1.9 g/dL — ABNORMAL LOW (ref 3.5–5.2)
Alkaline Phosphatase: 59 U/L (ref 39–117)
BUN: 26 mg/dL — ABNORMAL HIGH (ref 6–23)
GFR calc Af Amer: >60 mL/min (ref >60)
Potassium: 3.8 mEq/L (ref 3.5–5.1)
Sodium: 137 mEq/L (ref 135–145)
Total Protein: 5.8 g/dL — ABNORMAL LOW (ref 6.0–8.3)

## 2010-09-04 LAB — GLUCOSE, CAPILLARY
Glucose-Capillary: 159 mg/dL — ABNORMAL HIGH (ref 70–99)
Glucose-Capillary: 125 mg/dL — ABNORMAL HIGH (ref 70–99)
Glucose-Capillary: 116 mg/dL — ABNORMAL HIGH (ref 70–99)
Glucose-Capillary: 141 mg/dL — ABNORMAL HIGH (ref 70–99)
Glucose-Capillary: 144 mg/dL — ABNORMAL HIGH (ref 70–99)

## 2010-09-04 LAB — BLOOD GAS, ARTERIAL
Drawn by: 290241
FIO2: 0.6 %
MECHVT: 640 mL
PEEP: 10 cmH2O
Patient temperature: 100.4
TCO2: 25.8 mmol/L (ref 0–100)
pCO2 arterial: 37.9 mmHg (ref 35.0–45.0)
pH, Arterial: 7.435 (ref 7.350–7.450)

## 2010-09-04 LAB — TYPE AND SCREEN
ABO/RH(D): B POS
Antibody Screen: NEGATIVE

## 2010-09-04 LAB — BASIC METABOLIC PANEL
CO2: 21 mEq/L (ref 19–32)
Calcium: 6.9 mg/dL — ABNORMAL LOW (ref 8.4–10.5)
Creatinine, Ser: 1.33 mg/dL (ref 0.4–1.5)
GFR calc Af Amer: >60 mL/min (ref >60)
GFR calc non Af Amer: 55 mL/min — ABNORMAL LOW (ref >60)
Sodium: 137 mEq/L (ref 135–145)

## 2010-09-04 LAB — PHOSPHORUS: Phosphorus: 2.1 mg/dL — ABNORMAL LOW (ref 2.3–4.6)

## 2010-09-04 LAB — ABO/RH: ABO/RH(D): B POS

## 2010-09-04 LAB — URINE CULTURE: Colony Count: NO GROWTH

## 2010-09-05 ENCOUNTER — Inpatient Hospital Stay (HOSPITAL_COMMUNITY): Payer: Medicaid Other

## 2010-09-05 LAB — URINALYSIS, ROUTINE W REFLEX MICROSCOPIC
Specific Gravity, Urine: 1.03 (ref 1.005–1.030)
Urine Glucose, Fasting: NEGATIVE mg/dL
pH: 5 (ref 5.0–8.0)

## 2010-09-05 LAB — CARBOXYHEMOGLOBIN
Carboxyhemoglobin: 0.5 % (ref 0.5–1.5)
Carboxyhemoglobin: 0.5 % (ref 0.5–1.5)
Carboxyhemoglobin: 0.5 % (ref 0.5–1.5)
Methemoglobin: 0.7 % (ref 0.0–1.5)
Methemoglobin: 0.7 % (ref 0.0–1.5)
O2 Saturation: 62.9 %
O2 Saturation: 63.4 %
Total hemoglobin: 15.6 g/dL (ref 13.5–18.0)
Total hemoglobin: 14.4 g/dL (ref 13.5–18.0)
Total hemoglobin: 12.4 g/dL — ABNORMAL LOW (ref 13.5–18.0)

## 2010-09-05 LAB — COMPREHENSIVE METABOLIC PANEL
ALT: 13 U/L (ref 0–53)
AST: 26 U/L (ref 0–37)
AST: 24 U/L (ref 0–37)
Albumin: 1.4 g/dL — ABNORMAL LOW (ref 3.5–5.2)
BUN: 34 mg/dL — ABNORMAL HIGH (ref 6–23)
CO2: 16 mEq/L — ABNORMAL LOW (ref 19–32)
Calcium: 6.1 mg/dL — CL (ref 8.4–10.5)
Chloride: 105 mEq/L (ref 96–112)
Creatinine, Ser: 2.12 mg/dL — ABNORMAL HIGH (ref 0.4–1.5)
GFR calc Af Amer: 25 mL/min — ABNORMAL LOW (ref >60)
GFR calc non Af Amer: 32 mL/min — ABNORMAL LOW (ref >60)
Potassium: 3.9 mEq/L (ref 3.5–5.1)
Sodium: 137 mEq/L (ref 135–145)
Total Bilirubin: 0.8 mg/dL (ref 0.3–1.2)
Total Protein: 4.1 g/dL — ABNORMAL LOW (ref 6.0–8.3)

## 2010-09-05 LAB — GLUCOSE, CAPILLARY
Glucose-Capillary: 162 mg/dL — ABNORMAL HIGH (ref 70–99)
Glucose-Capillary: 174 mg/dL — ABNORMAL HIGH (ref 70–99)
Glucose-Capillary: 202 mg/dL — ABNORMAL HIGH (ref 70–99)
Glucose-Capillary: 166 mg/dL — ABNORMAL HIGH (ref 70–99)
Glucose-Capillary: 176 mg/dL — ABNORMAL HIGH (ref 70–99)

## 2010-09-05 LAB — CBC
Hemoglobin: 14.6 g/dL (ref 13.0–17.0)
MCH: 31.8 pg (ref 26.0–34.0)
MCHC: 34.9 g/dL (ref 30.0–36.0)
Platelets: 243 10*3/uL (ref 150–400)
RBC: 4.59 MIL/uL (ref 4.22–5.81)

## 2010-09-05 LAB — POCT I-STAT 3, ART BLOOD GAS (G3+)
Acid-base deficit: 9 mmol/L — ABNORMAL HIGH (ref 0.0–2.0)
Bicarbonate: 18.1 mEq/L — ABNORMAL LOW (ref 20.0–24.0)
O2 Saturation: 84 %
Patient temperature: 98.4
Patient temperature: 99.3
TCO2: 17 mmol/L (ref 0–100)
pCO2 arterial: 31.3 mmHg — ABNORMAL LOW (ref 35.0–45.0)
pH, Arterial: 7.349 — ABNORMAL LOW (ref 7.350–7.450)
pH, Arterial: 7.323 — ABNORMAL LOW (ref 7.350–7.450)

## 2010-09-05 LAB — HEPARIN LEVEL (UNFRACTIONATED): Heparin Unfractionated: 0.43 IU/mL (ref 0.30–0.70)

## 2010-09-05 LAB — VANCOMYCIN, TROUGH: Vancomycin Tr: 37.8 ug/mL (ref 10.0–20.0)

## 2010-09-05 LAB — URINE MICROSCOPIC-ADD ON

## 2010-09-05 LAB — CARDIAC PANEL(CRET KIN+CKTOT+MB+TROPI): Troponin I: 0.03 ng/mL (ref 0.00–0.06)

## 2010-09-05 LAB — BASIC METABOLIC PANEL
BUN: 37 mg/dL — ABNORMAL HIGH (ref 6–23)
Creatinine, Ser: 2.79 mg/dL — ABNORMAL HIGH (ref 0.4–1.5)
GFR calc non Af Amer: 23 mL/min — ABNORMAL LOW (ref >60)
Glucose, Bld: 201 mg/dL — ABNORMAL HIGH (ref 70–99)

## 2010-09-06 ENCOUNTER — Inpatient Hospital Stay (HOSPITAL_COMMUNITY): Payer: Medicaid Other

## 2010-09-06 LAB — POCT I-STAT 3, ART BLOOD GAS (G3+)
Acid-base deficit: 10 mmol/L — ABNORMAL HIGH (ref 0.0–2.0)
Patient temperature: 99.1
Patient temperature: 98.22
TCO2: 18 mmol/L (ref 0–100)
pCO2 arterial: 33.5 mmHg — ABNORMAL LOW (ref 35.0–45.0)
pH, Arterial: 7.293 — ABNORMAL LOW (ref 7.350–7.450)
pH, Arterial: 7.284 — ABNORMAL LOW (ref 7.350–7.450)
pO2, Arterial: 67 mmHg — ABNORMAL LOW (ref 80.0–100.0)

## 2010-09-06 LAB — GLUCOSE, CAPILLARY
Glucose-Capillary: 133 mg/dL — ABNORMAL HIGH (ref 70–99)
Glucose-Capillary: 180 mg/dL — ABNORMAL HIGH (ref 70–99)

## 2010-09-06 LAB — URINALYSIS, ROUTINE W REFLEX MICROSCOPIC
Bilirubin Urine: NEGATIVE
Ketones, ur: NEGATIVE mg/dL
Nitrite: NEGATIVE
Urobilinogen, UA: 0.2 mg/dL (ref 0.0–1.0)

## 2010-09-06 LAB — CBC
MCV: 89.5 fL (ref 78.0–100.0)
Platelets: 186 10*3/uL (ref 150–400)
RDW: 13.9 % (ref 11.5–15.5)
WBC: 28.7 10*3/uL — ABNORMAL HIGH (ref 4.0–10.5)

## 2010-09-06 LAB — URINE CULTURE
Culture  Setup Time: 201202150917
Special Requests: NEGATIVE

## 2010-09-06 LAB — COMPREHENSIVE METABOLIC PANEL
Albumin: 1.5 g/dL — ABNORMAL LOW (ref 3.5–5.2)
BUN: 40 mg/dL — ABNORMAL HIGH (ref 6–23)
Chloride: 110 mEq/L (ref 96–112)
Creatinine, Ser: 3.49 mg/dL — ABNORMAL HIGH (ref 0.4–1.5)
GFR calc non Af Amer: 18 mL/min — ABNORMAL LOW (ref >60)
Total Bilirubin: 0.6 mg/dL (ref 0.3–1.2)

## 2010-09-06 LAB — SODIUM, URINE, RANDOM: Sodium, Ur: 55 mEq/L

## 2010-09-06 LAB — URINE MICROSCOPIC-ADD ON

## 2010-09-06 LAB — CORTISOL: Cortisol, Plasma: 20.4 ug/dL

## 2010-09-06 LAB — VANCOMYCIN, RANDOM: Vancomycin Rm: 29.7 ug/mL

## 2010-09-06 LAB — BASIC METABOLIC PANEL
Calcium: 6 mg/dL — CL (ref 8.4–10.5)
Creatinine, Ser: 3.93 mg/dL — ABNORMAL HIGH (ref 0.4–1.5)
GFR calc Af Amer: 19 mL/min — ABNORMAL LOW (ref >60)

## 2010-09-06 LAB — CALCIUM, IONIZED: Calcium, Ion: 0.9 mmol/L — ABNORMAL LOW (ref 1.12–1.32)

## 2010-09-07 ENCOUNTER — Inpatient Hospital Stay (HOSPITAL_COMMUNITY): Payer: Medicaid Other

## 2010-09-07 LAB — CBC
HCT: 27.8 % — ABNORMAL LOW (ref 39.0–52.0)
MCH: 31 pg (ref 26.0–34.0)
MCHC: 34.5 g/dL (ref 30.0–36.0)
MCV: 89.7 fL (ref 78.0–100.0)
RDW: 13.8 % (ref 11.5–15.5)

## 2010-09-07 LAB — RENAL FUNCTION PANEL
Albumin: 1.5 g/dL — ABNORMAL LOW (ref 3.5–5.2)
Chloride: 108 mEq/L (ref 96–112)
Creatinine, Ser: 2.96 mg/dL — ABNORMAL HIGH (ref 0.4–1.5)
GFR calc Af Amer: 26 mL/min — ABNORMAL LOW (ref >60)
GFR calc non Af Amer: 22 mL/min — ABNORMAL LOW (ref >60)
Phosphorus: 2.9 mg/dL (ref 2.3–4.6)
Potassium: 3.9 mEq/L (ref 3.5–5.1)

## 2010-09-07 LAB — DIFFERENTIAL
Basophils Absolute: 0 10*3/uL (ref 0.0–0.1)
Basophils Relative: 0 % (ref 0–1)
Blasts: 0 %
Lymphocytes Relative: 2 % — ABNORMAL LOW (ref 12–46)
Lymphs Abs: 0.5 10*3/uL — ABNORMAL LOW (ref 0.7–4.0)
Neutro Abs: 24 10*3/uL — ABNORMAL HIGH (ref 1.7–7.7)
Neutrophils Relative %: 94 % — ABNORMAL HIGH (ref 43–77)
Promyelocytes Absolute: 0 %
nRBC: 0 /100 WBC

## 2010-09-07 LAB — POCT I-STAT 3, ART BLOOD GAS (G3+)
O2 Saturation: 92 %
TCO2: 20 mmol/L (ref 0–100)
pCO2 arterial: 38.3 mmHg (ref 35.0–45.0)
pO2, Arterial: 68 mmHg — ABNORMAL LOW (ref 80.0–100.0)

## 2010-09-07 LAB — HEPARIN LEVEL (UNFRACTIONATED): Heparin Unfractionated: 0.6 IU/mL (ref 0.30–0.70)

## 2010-09-07 LAB — COMPREHENSIVE METABOLIC PANEL
Alkaline Phosphatase: 63 U/L (ref 39–117)
BUN: 36 mg/dL — ABNORMAL HIGH (ref 6–23)
CO2: 20 mEq/L (ref 19–32)
Chloride: 108 mEq/L (ref 96–112)
Creatinine, Ser: 3.31 mg/dL — ABNORMAL HIGH (ref 0.4–1.5)
GFR calc non Af Amer: 19 mL/min — ABNORMAL LOW (ref >60)
Glucose, Bld: 162 mg/dL — ABNORMAL HIGH (ref 70–99)
Total Bilirubin: 0.4 mg/dL (ref 0.3–1.2)

## 2010-09-07 LAB — GLUCOSE, CAPILLARY
Glucose-Capillary: 147 mg/dL — ABNORMAL HIGH (ref 70–99)
Glucose-Capillary: 160 mg/dL — ABNORMAL HIGH (ref 70–99)
Glucose-Capillary: 172 mg/dL — ABNORMAL HIGH (ref 70–99)

## 2010-09-07 LAB — TISSUE CULTURE

## 2010-09-07 LAB — BRAIN NATRIURETIC PEPTIDE: Pro B Natriuretic peptide (BNP): 67 pg/mL (ref 0.0–100.0)

## 2010-09-07 LAB — PHOSPHORUS: Phosphorus: 3.6 mg/dL (ref 2.3–4.6)

## 2010-09-08 ENCOUNTER — Inpatient Hospital Stay (HOSPITAL_COMMUNITY): Payer: Medicaid Other

## 2010-09-08 DIAGNOSIS — R6520 Severe sepsis without septic shock: Secondary | ICD-10-CM

## 2010-09-08 DIAGNOSIS — A419 Sepsis, unspecified organism: Secondary | ICD-10-CM

## 2010-09-08 LAB — CBC
HCT: 24.4 % — ABNORMAL LOW (ref 39.0–52.0)
Hemoglobin: 8.6 g/dL — ABNORMAL LOW (ref 13.0–17.0)
MCH: 31.5 pg (ref 26.0–34.0)
MCHC: 35.2 g/dL (ref 30.0–36.0)

## 2010-09-08 LAB — GLUCOSE, CAPILLARY
Glucose-Capillary: 185 mg/dL — ABNORMAL HIGH (ref 70–99)
Glucose-Capillary: 207 mg/dL — ABNORMAL HIGH (ref 70–99)
Glucose-Capillary: 165 mg/dL — ABNORMAL HIGH (ref 70–99)

## 2010-09-08 LAB — RENAL FUNCTION PANEL
Albumin: 1.3 g/dL — ABNORMAL LOW (ref 3.5–5.2)
BUN: 35 mg/dL — ABNORMAL HIGH (ref 6–23)
Chloride: 105 mEq/L (ref 96–112)
Glucose, Bld: 225 mg/dL — ABNORMAL HIGH (ref 70–99)
Potassium: 3.7 mEq/L (ref 3.5–5.1)

## 2010-09-08 LAB — POCT I-STAT 3, ART BLOOD GAS (G3+)
Acid-base deficit: 4 mmol/L — ABNORMAL HIGH (ref 0.0–2.0)
Bicarbonate: 20.7 mEq/L (ref 20.0–24.0)
O2 Saturation: 98 %
Patient temperature: 100.2
pO2, Arterial: 123 mmHg — ABNORMAL HIGH (ref 80.0–100.0)

## 2010-09-09 ENCOUNTER — Inpatient Hospital Stay (HOSPITAL_COMMUNITY): Payer: Medicaid Other

## 2010-09-09 DIAGNOSIS — R6521 Severe sepsis with septic shock: Secondary | ICD-10-CM

## 2010-09-09 DIAGNOSIS — A419 Sepsis, unspecified organism: Secondary | ICD-10-CM

## 2010-09-09 DIAGNOSIS — I4891 Unspecified atrial fibrillation: Secondary | ICD-10-CM

## 2010-09-09 LAB — RENAL FUNCTION PANEL
Albumin: 1.3 g/dL — ABNORMAL LOW (ref 3.5–5.2)
BUN: 45 mg/dL — ABNORMAL HIGH (ref 6–23)
Calcium: 6.3 mg/dL — CL (ref 8.4–10.5)
Creatinine, Ser: 4.17 mg/dL — ABNORMAL HIGH (ref 0.4–1.5)
GFR calc Af Amer: 18 mL/min — ABNORMAL LOW (ref >60)
GFR calc non Af Amer: 15 mL/min — ABNORMAL LOW (ref >60)

## 2010-09-09 LAB — CALCIUM, IONIZED: Calcium, Ion: 0.95 mmol/L — ABNORMAL LOW (ref 1.12–1.32)

## 2010-09-09 LAB — COMPREHENSIVE METABOLIC PANEL
ALT: 13 U/L (ref 0–53)
AST: 19 U/L (ref 0–37)
Albumin: 1.3 g/dL — ABNORMAL LOW (ref 3.5–5.2)
Alkaline Phosphatase: 85 U/L (ref 39–117)
BUN: 51 mg/dL — ABNORMAL HIGH (ref 6–23)
Chloride: 103 mEq/L (ref 96–112)
GFR calc Af Amer: 15 mL/min — ABNORMAL LOW (ref >60)
Potassium: 3.7 mEq/L (ref 3.5–5.1)
Sodium: 130 mEq/L — ABNORMAL LOW (ref 135–145)
Total Bilirubin: 0.7 mg/dL (ref 0.3–1.2)
Total Protein: 4.9 g/dL — ABNORMAL LOW (ref 6.0–8.3)

## 2010-09-09 LAB — CULTURE, BLOOD (ROUTINE X 2)
Culture  Setup Time: 201202130016
Culture: NO GROWTH

## 2010-09-09 LAB — CBC
Hemoglobin: 8.6 g/dL — ABNORMAL LOW (ref 13.0–17.0)
MCH: 31.2 pg (ref 26.0–34.0)
RBC: 2.76 MIL/uL — ABNORMAL LOW (ref 4.22–5.81)
WBC: 17.4 10*3/uL — ABNORMAL HIGH (ref 4.0–10.5)

## 2010-09-09 LAB — GLUCOSE, CAPILLARY
Glucose-Capillary: 146 mg/dL — ABNORMAL HIGH (ref 70–99)
Glucose-Capillary: 151 mg/dL — ABNORMAL HIGH (ref 70–99)

## 2010-09-09 LAB — ANAEROBIC CULTURE

## 2010-09-09 LAB — POCT I-STAT 3, ART BLOOD GAS (G3+)
Bicarbonate: 17.3 mEq/L — ABNORMAL LOW (ref 20.0–24.0)
Patient temperature: 98.4
TCO2: 18 mmol/L (ref 0–100)
pH, Arterial: 7.321 — ABNORMAL LOW (ref 7.350–7.450)
pO2, Arterial: 111 mmHg — ABNORMAL HIGH (ref 80.0–100.0)

## 2010-09-09 LAB — HEPARIN LEVEL (UNFRACTIONATED): Heparin Unfractionated: 0.53 IU/mL (ref 0.30–0.70)

## 2010-09-09 LAB — CULTURE, RESPIRATORY W GRAM STAIN

## 2010-09-10 ENCOUNTER — Inpatient Hospital Stay (HOSPITAL_COMMUNITY): Payer: Medicaid Other

## 2010-09-10 LAB — CBC
HCT: 23.2 % — ABNORMAL LOW (ref 39.0–52.0)
Hemoglobin: 8.3 g/dL — ABNORMAL LOW (ref 13.0–17.0)
MCH: 31.2 pg (ref 26.0–34.0)
MCHC: 35.8 g/dL (ref 30.0–36.0)
MCV: 87.2 fL (ref 78.0–100.0)
Platelets: 281 K/uL (ref 150–400)
RBC: 2.66 MIL/uL — ABNORMAL LOW (ref 4.22–5.81)
RDW: 13.3 % (ref 11.5–15.5)
WBC: 12.9 K/uL — ABNORMAL HIGH (ref 4.0–10.5)

## 2010-09-10 LAB — COMPREHENSIVE METABOLIC PANEL WITH GFR
ALT: 14 U/L (ref 0–53)
AST: 19 U/L (ref 0–37)
Albumin: 1.4 g/dL — ABNORMAL LOW (ref 3.5–5.2)
Alkaline Phosphatase: 74 U/L (ref 39–117)
BUN: 31 mg/dL — ABNORMAL HIGH (ref 6–23)
CO2: 22 meq/L (ref 19–32)
Calcium: 7.1 mg/dL — ABNORMAL LOW (ref 8.4–10.5)
Chloride: 103 meq/L (ref 96–112)
Creatinine, Ser: 2.98 mg/dL — ABNORMAL HIGH (ref 0.4–1.5)
GFR calc non Af Amer: 22 mL/min — ABNORMAL LOW
Glucose, Bld: 206 mg/dL — ABNORMAL HIGH (ref 70–99)
Potassium: 3.6 meq/L (ref 3.5–5.1)
Sodium: 132 meq/L — ABNORMAL LOW (ref 135–145)
Total Bilirubin: 0.5 mg/dL (ref 0.3–1.2)
Total Protein: 4.9 g/dL — ABNORMAL LOW (ref 6.0–8.3)

## 2010-09-10 LAB — DIFFERENTIAL
Basophils Absolute: 0.1 K/uL (ref 0.0–0.1)
Basophils Relative: 1 % (ref 0–1)
Eosinophils Absolute: 0.3 K/uL (ref 0.0–0.7)
Eosinophils Relative: 2 % (ref 0–5)
Lymphocytes Relative: 9 % — ABNORMAL LOW (ref 12–46)
Lymphs Abs: 1.2 K/uL (ref 0.7–4.0)
Monocytes Absolute: 1 K/uL (ref 0.1–1.0)
Monocytes Relative: 8 % (ref 3–12)
Neutro Abs: 10.3 K/uL — ABNORMAL HIGH (ref 1.7–7.7)
Neutrophils Relative %: 80 % — ABNORMAL HIGH (ref 43–77)

## 2010-09-10 LAB — POCT I-STAT 3, ART BLOOD GAS (G3+)
Bicarbonate: 21.4 mEq/L (ref 20.0–24.0)
Patient temperature: 97.4
pH, Arterial: 7.381 (ref 7.350–7.450)

## 2010-09-10 LAB — COMPREHENSIVE METABOLIC PANEL
AST: 17 U/L (ref 0–37)
Albumin: 1.3 g/dL — ABNORMAL LOW (ref 3.5–5.2)
BUN: 37 mg/dL — ABNORMAL HIGH (ref 6–23)
Calcium: 6.1 mg/dL — CL (ref 8.4–10.5)
Creatinine, Ser: 3.14 mg/dL — ABNORMAL HIGH (ref 0.4–1.5)
GFR calc Af Amer: 25 mL/min — ABNORMAL LOW (ref >60)

## 2010-09-10 LAB — GLUCOSE, CAPILLARY
Glucose-Capillary: 199 mg/dL — ABNORMAL HIGH (ref 70–99)
Glucose-Capillary: 233 mg/dL — ABNORMAL HIGH (ref 70–99)
Glucose-Capillary: 191 mg/dL — ABNORMAL HIGH (ref 70–99)

## 2010-09-10 LAB — PHOSPHORUS: Phosphorus: 2.9 mg/dL (ref 2.3–4.6)

## 2010-09-10 LAB — HEPARIN LEVEL (UNFRACTIONATED)
Heparin Unfractionated: 0.81 IU/mL — ABNORMAL HIGH (ref 0.30–0.70)
Heparin Unfractionated: 0.45 IU/mL (ref 0.30–0.70)

## 2010-09-10 LAB — MAGNESIUM: Magnesium: 2.2 mg/dL (ref 1.5–2.5)

## 2010-09-10 LAB — TRIGLYCERIDES: Triglycerides: 95 mg/dL (ref <150)

## 2010-09-10 LAB — PROCALCITONIN

## 2010-09-10 LAB — CHOLESTEROL, TOTAL

## 2010-09-10 LAB — PREALBUMIN: Prealbumin: 6.1 mg/dL — ABNORMAL LOW (ref 17.0–34.0)

## 2010-09-10 LAB — APTT

## 2010-09-10 LAB — LACTIC ACID, PLASMA: Lactic Acid, Venous: 1.2 mmol/L (ref 0.5–2.2)

## 2010-09-10 NOTE — Op Note (Signed)
NAMEANTHONYMICHAEL, MCGARY              ACCOUNT NO.:  [id number]  MEDICAL RECORD NO.:  [id number]           PATIENT TYPE:  I  LOCATION:  2316                         FACILITY:  [facility]  PHYSICIAN:  Adin Hector, MD     DATE OF BIRTH:  12-07-1950  DATE OF PROCEDURE:  09/04/2010 DATE OF DISCHARGE:                              OPERATIVE REPORT   PRIMARY CARE PHYSICIAN:  R. Garfield Cornea, MD  SURGEON:  Jamesetta So, MD [place]:  Zenovia Jarred, MD  PREOPERATIVE DIAGNOSES: 1. Recurrent ventral hernia, status post laparoscopic lysis of     adhesions and left ventral hernia repair on August 29, 2010. 2. Worsening pneumoperitoneum and succus in ostomy suspicious for     small bowel perforation.  POSTOPERATIVE DIAGNOSIS: 1. Recurrent ventral hernia, status post laparoscopic lysis of     adhesions and left ventral hernia repair on August 29, 2010. 2. Worsening pneumoperitoneum and succus in ostomy with small bowel     perforation.3. Interloop deep abscess.  PROCEDURE: 1. Open lysis of adhesions x60 minutes. 2. Small bowel resection x60 cm. 3. Open closure with wound VAC. 4. Open drainage and debridement of interloop mesentry abscess.  ANESTHESIA:  General anesthesia.  SPECIMENS:  Mid ileum.  DRAINS:  None.  ESTIMATED BLOOD LOSS:  150 mL.  COMPLICATIONS:  None apparent.  INDICATIONS:  [name] is a 60 year old Hispanic male who had an appendectomy in the remote past.  He developed hernia.  He had an open repair with mesh.  He developed recurrence.  He saw Dr. Arnoldo Morale.  Dr. Arnoldo Morale did laparoscopic lysis of adhesions and ventral hernia repair on August 29, 2010.  Postoperatively, he immediately went into atrial fibrillation, rapid ventricular response. He had worsening pulmonary function as well.  He ended up getting intubated and transferred to [place], it was noted that his former hernia site began to be red.  He was  spiking some fevers as well.  We were counseled yesterday evening. CAT scan initial read did not show any definite perforation, but did show increased air around the small bowel as well as free fluid.  Based on the concerns and the fact that his white count went up, I went ahead and sterilely aspirated frank succus today and had long discussion with the patient's family.  The wife does not speak [place], but the daughters are bilingual.  The concern of a small bowel perforation and need for emergent surgery is discussed.  Risks, benefits, and alternatives were discussed.  Questions were answered and they agreed to proceed.  I discussed findings of Dr. Arnoldo Morale who was called over concern of the patient today.  OPERATIVE FINDINGS:  He had a dense wad of small bowel adhesions to old mesh in hernia sac, cecum was involved as well.  In the middle of that, he had a pin-hole perforation.  There is no frank small bowel necrosis.  He did have a deeper interloop abscess.  DESCRIPTION OF PROCEDURE:  Informed consent was confirmed.  The patient was already on IV vancomycin and Zosyn.  He came and intubated.  He already had IV lines put here.  He had new lines and A-line was placed per critical care medicine.  He underwent general anesthesia without any difficulty.  His abdomen was prepped and draped in sterile fashion. Surgical time-out confirmed our plan.  Because of all the inflammation on the right lower quadrant and right flank, I went ahead and opened up through his prior right lower quadrant transverse incision.  I immediately encountered frank succus and gas.  I extended the fascial opening towards the midline medially and a little bit more laterally as well.  I could see the mesh was intact.  The tacks were rather okay.  The fascial stitch was okay.  I could see no stitches or tacks going through small bowel or other abnormalities.  I went ahead and removed that, and removed the metal  packs as well.  Over that, I saw dense phlegmonous wad.  It was hard to tell what was what.  I cannot seen any definite pin hole.  I proceed to do lysis of adhesions through some blood and sharp focus and occasional cautery as well.  With that I could mobilize the small bowel and eviscerate this large phlegmonous wad of small bowel serosa, etc.  I could find small bowel proximal to is and it was involving distal small bowel consistent with the ilium.  It took a little time eventually, I could eviscerate more inferiorly as well.  I found ascending colon more proximally and seen to be going into this well.  I was concerned cecum was involved as well.  I found a proximal small bowel loop and a distal small bowel loop.  Eventually freed off the small bowel loop and found the terminal ileum going in deep to this inflamed wad.  Eventually, through some sharp dissection we were able to mobilize small bowel loops out.  With further evisceration , I could see the pin-hole opening with the source of the perforation.  I could not get good planes between the serosa of the small bowel dense waded loops; however, I could start freeing some of the mesentry, which was folded upon itself accordion style.  I could see how the ileum that had folded accordion style upon itself.  We mobilized the right colon.  With that, we were able to find the terminal ileum and follow that until it came to the cecum.  I was able to free the cecum off his inflammatory wall including the hernia sac.  With that I knew that I would require small bowel resection.  We did meticulous inspection of the cecum and ascending colon and could not see any serosal tears or any injury.  I therefore transected the small bowel about 2 feet proximal to the ileocecal junction with the GI stapler.  We took the mesentery radially using the large EnSeal bipolar system and staying high up more on the small bowel and took the mesentery until he  came out of the ward and transected more proximal ileum.  It was about two feet of small bowel that was removed.  I did eviscerate the rest of the small bowel.  During that, I evacuated a deeper abscess pocket and washed that out.  I had some dilated boggy small bowel loops.  At this point because where I had healthy small bowel, that was only slightly beat up, I thought it would be reasonable to do the small bowel anastomosis since the major phlegmon mass was removed and the small bowel and the  small interloop abscess was away from the ileal mesentery.  Contamination had been decreased.  Therefore we did a side-to-side staple anastomosis using a 75 GIA stapler, antimesenteric.  I placed a silk stitch at the crotch and proximal to the anastomosis.  We inspected the common wound defect and actually aspirated about a liter of bile to help decompress the small bowel more proximally as the proximal jejunum was rather dilated and boggy, but that was better decompressed.  Reapproximated the hole using interrupted 3-0 silk stitches.  The anastomosis is about 3 cm proximal to the ends to avoid any ischemic issues.  The anastomosis was viable.  We closed the common mesenteric defect using 2-0 silk interrupted stitches.  We did copious irrigation with several liters of saline in all the quadrants.  It was clear on the left abdomen and primarily the right upper quadrant.  We focused more in the right lower quadrant as well.  I laid the anastomosis more in the right upper quadrant and allowed the ileal mesentery to kind of be what it was exposed in the wound and the fascia.  I debrided the obvious old tacks and old stitches from the initial hernia repair.  There was some incorporated mesh in the hernia sac cephalad.  We debrided back any raw areas, but it was well incorporated, so we did not make any more aggressive efforts to debride. He had some fair amount of contamination in the hernia sac  and subcutaneous tissue, was debrided and cleaned that off as well.  With the inflammation and shock, we decided a second look would be wise. Therefore, we placed the trauma wound VAC in the peritoneal cavity and placed a wound VAC sponge in the subcutaneous tissues and did a seal and closed that down.  Instruments and sponge counts were correct.  He was sent to the ICU in critical condition.  He did not have any significant hypotension.  I am discussed the operative findings with the patient's family.  Per Dr. Arnoldo Morale request, I discussed with him as well.     Adin Hector, MD     SCG/MEDQ  D:  09/04/2010  T:  09/05/2010  Job:  [id number]  Electronically Signed by Michael Boston MD on 09/10/2010 09:49:26 AM

## 2010-09-10 NOTE — Op Note (Signed)
[name]              ACCOUNT NO.:  [id number]  MEDICAL RECORD NO.:  [id number]           PATIENT TYPE:  I  LOCATION:  2312                         FACILITY:  [facility]  PHYSICIAN:  Adin Hector, MD     DATE OF BIRTH:  [birth year]  DATE OF PROCEDURE: DATE OF DISCHARGE:                              OPERATIVE REPORT   PRIMARY CARE PHYSICIAN:  R. Garfield Cornea, MD  PRIOR SURGEON:  Jamesetta So, MD  PRESENT SURGEON:  Adin Hector, MD  PREOPERATIVE DIAGNOSES: 1. Small bowel perforation, status post small bowel resection and     debridement of anterior abdominal wall. 2. Recurrent ventral hernia, status post prior open laparoscopic     repairs.  POSTOPERATIVE DIAGNOSES: 1. Small bowel perforation, status post small bowel resection and     debridement of anterior abdominal wall. 2. Recurrent ventral hernia, status post prior open laparoscopic     repairs.  PROCEDURES: 1. Exploration of abdomen with abdominal washout. 2. Multilayered closure of right lower quadrant fascial defect with     biologic mesh (sandwich technique). 3. Placement of wound VAC in subcutaneous tissues.  ANESTHESIA:  General anesthesia.  SPECIMENS:  None.  SPECIMENS:  A 19-French Blake drain  rest in the deep anterior abdominal wall later, internal oblique/preperitoneal layers.  ESTIMATED BLOOD LOSS:  Minimal.  COMPLICATIONS:  None apparent.  INDICATIONS:  [name] is a 60 year old male who had a recurrent ventral incisional hernia for a prior open right lower quadrant, transverse open appendectomy.  He underwent laparoscopic lysis of adhesions and ventral hernia repair.  He had cardiopulmonary problems and was transferred from [facility] to [facility], and began to develop a leak.  He underwent emergent lysis of adhesions of small bowel resection by myself and Dr. Grandville Silos 2 days ago.  His cardiopulmonary, which is stabilized, although he has got some  worsening renal failure.  On discussion with the family, I had recommended abdominal reexploration with possible closure versus repeated washout.  Possible diverting ostomy was discussed.  Risks, benefits, and alternatives were discussed.  Questions were answered and he and the patient's family agreed to proceed.  OPERATIVE FINDINGS:  He had some moderate edema and early soft adhesions interloop in the abdomen.  Intestine was pink and healthy.  There was no evidence of any leak.  There was no evidence of any abscess.  The abdominal wall fascia looked healthy and viable.  The area cleaned up well.  There was no worsening necrosis.  DESCRIPTION OF PROCEDURE:  Informed consent was confirmed.  The patient was already on IV vancomycin and Zosyn.  He arrived intubated.  He was positioned supine.  He underwent complete general anesthesia.  His wound VAC was removed.  His abdomen and subcutaneous tissues were prepped and draped in sterile fashion.  Surgical time-out confirmed our plan.  When I removed the deeper wound VAC in the peritoneum.  I could see the ileal mesentery in the base of the wound with the open fascia.  I gently did some exploration and washed out some serosanguineous ascites in the abdomen.  I washed with  several liters of saline and around the pelvis in the right abdomen.  The left abdomen seemed clean.  I inspected the anastomosis, which was superolateral more and it flipped up more in the right upper quadrant epigastric region.  The anastomosis looked viable and  I saw no evidence of leak.  I did my best to avoid disturbing it too much.  I examined the abdominal wound.  I went and removed some old spiral tacks and a little bit of exposed old mesh and turned back.  I did debridement of subcutaneous tissue in fascia, but had actually cleaned up remarkably well.  I had to do minimal debridement for any fat necrosis, and I saw no evidence of worsening fasciitis or  cellulitis.  Based on the wound VAC, it cleaned up well and his intestines look viable, anastomosis looked well aligned, and I thought it would be reasonable to proceed with closure.  I went over to the medial part of the wound where it had already split the right rectus abdominis and I could find posterior rectus fascia.  I followed that with my finger and helped free off the preperitoneal and posterior rectus fascia, transition over the internal oblique fascia more laterally until I freed off the deeper fascial layer circumferentially.  Once, I got little outside of the inflammation of the wound, but I could some nice healthy avascular planes circumferentially.  I could undermine 5-10 cm circumferentially.  With that I could that layer, posterior rectus fascia, and internal oblique fascia to come together well with not much attention.  I went ahead and closed that using #1 running loop PDS.  Tissue was healthy and viable.  I irrigated this deeper abdominal fascial layer.  I had a resulting pretty good deep pocket of dissection.  I placed a 20 x 25 cm biologic mesh (Flex HD).  I washed it first.  I secured 0 PDS interrupted stitches and I placed a total of 14 stitches around the periphery through the anterior fascia and out the skin periphery.  There were 4 on the superior and inferior edges and 3 on the medial and lateral edges.  I laid that in the preperitoneal plane.  I used Endoclose suture passer to pass from the skin through the anterior fascia into the preperitoneal wound under direct visualization and carefully used that to pull up all the stitches.  With that I could pull the mesh and tying it down, and mesh laid quite flat.  Before I tied all that down, I did place a drain coming out of the left lower quadrant and so that it would be resting between the deep fascial closure and the biologic mesh.  Once the drain was in placed, I tied all the fascia which is down.  I freed  some of the subcutaneous tissue of the remaining anterior rectus fascia, anterior abdominal fascia, and rectus fascia.  There were some old mesh I came across and I freed that up and used that as a part of the anterior fascia as well as since it was well incorporated.  I trimmed off any exposed areas.  I removed a few extra metal spiral tacks from the old initial open onlay repair.  I irrigated the wound.  I reapproximated this fascia using #1 interrupted stitches.  There were some mild tension in the central area where the largest area of the defect was, but actually it came together well with not too much tension.  Now, we checked the respiratory pressures  and the peak pressures were in the mid to high 20s only.  Therefore, I felt it was safe to continue closure.  I irrigated subcutaneous tissues.  The subcutaneous tissues looked healthy and viable and I did not see much fat necrosis remaining and I trimmed that off.  I went ahead and placed a wound VAC.  I secured the drain using the puncture sites for the transabdominal fascial stitches were closed using Steri-Strips.  The drain was secured with nylon stitch, then I placed a wound VAC.  The patient was sent and intubated to the ICU on amnio drip and still in a guarded/critical condition relatively stable and unchanged.  I discussed all these plans preoperative with the patient's family especially one of the daughter that is bilingual.  I discussed with them.  they expressed understanding and appreciation.     Adin Hector, MD     SCG/MEDQ  D:  09/06/2010  T:  09/07/2010  Job:  [id number]  cc:   R. Garfield Cornea, MD Jamesetta So, M.D.  Electronically Signed by Michael Boston MD on 09/10/2010 09:54:23 AM

## 2010-09-11 ENCOUNTER — Inpatient Hospital Stay (HOSPITAL_COMMUNITY): Payer: Medicaid Other

## 2010-09-11 LAB — GLUCOSE, CAPILLARY
Glucose-Capillary: 226 mg/dL — ABNORMAL HIGH (ref 70–99)
Glucose-Capillary: 227 mg/dL — ABNORMAL HIGH (ref 70–99)
Glucose-Capillary: 168 mg/dL — ABNORMAL HIGH (ref 70–99)

## 2010-09-11 LAB — COMPREHENSIVE METABOLIC PANEL
Albumin: 1.3 g/dL — ABNORMAL LOW (ref 3.5–5.2)
Alkaline Phosphatase: 79 U/L (ref 39–117)
BUN: 41 mg/dL — ABNORMAL HIGH (ref 6–23)
CO2: 21 mEq/L (ref 19–32)
Chloride: 100 mEq/L (ref 96–112)
Creatinine, Ser: 3.84 mg/dL — ABNORMAL HIGH (ref 0.4–1.5)
GFR calc non Af Amer: 16 mL/min — ABNORMAL LOW (ref >60)
Glucose, Bld: 217 mg/dL — ABNORMAL HIGH (ref 70–99)
Total Bilirubin: 0.5 mg/dL (ref 0.3–1.2)

## 2010-09-11 LAB — CULTURE, BLOOD (ROUTINE X 2): Culture: NO GROWTH

## 2010-09-11 LAB — PHOSPHORUS: Phosphorus: 3.2 mg/dL (ref 2.3–4.6)

## 2010-09-11 LAB — CBC
Hemoglobin: 7.9 g/dL — ABNORMAL LOW (ref 13.0–17.0)
MCH: 31.9 pg (ref 26.0–34.0)
Platelets: 319 10*3/uL (ref 150–400)
RBC: 2.48 MIL/uL — ABNORMAL LOW (ref 4.22–5.81)
WBC: 12.9 10*3/uL — ABNORMAL HIGH (ref 4.0–10.5)

## 2010-09-11 LAB — BRAIN NATRIURETIC PEPTIDE: Pro B Natriuretic peptide (BNP): 278 pg/mL — ABNORMAL HIGH (ref 0.0–100.0)

## 2010-09-11 LAB — MAGNESIUM: Magnesium: 2 mg/dL (ref 1.5–2.5)

## 2010-09-11 LAB — HEPARIN LEVEL (UNFRACTIONATED): Heparin Unfractionated: 0.56 IU/mL (ref 0.30–0.70)

## 2010-09-12 ENCOUNTER — Inpatient Hospital Stay (HOSPITAL_COMMUNITY): Payer: Medicaid Other

## 2010-09-12 LAB — GLUCOSE, CAPILLARY
Glucose-Capillary: 185 mg/dL — ABNORMAL HIGH (ref 70–99)
Glucose-Capillary: 192 mg/dL — ABNORMAL HIGH (ref 70–99)
Glucose-Capillary: 197 mg/dL — ABNORMAL HIGH (ref 70–99)
Glucose-Capillary: 233 mg/dL — ABNORMAL HIGH (ref 70–99)

## 2010-09-12 LAB — COMPREHENSIVE METABOLIC PANEL
ALT: 10 U/L (ref 0–53)
Alkaline Phosphatase: 90 U/L (ref 39–117)
BUN: 56 mg/dL — ABNORMAL HIGH (ref 6–23)
CO2: 19 mEq/L (ref 19–32)
Calcium: 6.8 mg/dL — ABNORMAL LOW (ref 8.4–10.5)
GFR calc non Af Amer: 12 mL/min — ABNORMAL LOW (ref >60)
Glucose, Bld: 161 mg/dL — ABNORMAL HIGH (ref 70–99)
Potassium: 3.7 mEq/L (ref 3.5–5.1)
Sodium: 127 mEq/L — ABNORMAL LOW (ref 135–145)
Total Protein: 5.4 g/dL — ABNORMAL LOW (ref 6.0–8.3)

## 2010-09-12 LAB — CBC
HCT: 22.5 % — ABNORMAL LOW (ref 39.0–52.0)
Hemoglobin: 8 g/dL — ABNORMAL LOW (ref 13.0–17.0)
MCHC: 35.6 g/dL (ref 30.0–36.0)
MCV: 87.2 fL (ref 78.0–100.0)
RDW: 13.7 % (ref 11.5–15.5)

## 2010-09-12 LAB — PHOSPHORUS: Phosphorus: 3.2 mg/dL (ref 2.3–4.6)

## 2010-09-13 LAB — OSMOLALITY, URINE: Osmolality, Ur: 200 mOsm/kg — ABNORMAL LOW (ref 390–1090)

## 2010-09-13 LAB — DIFFERENTIAL
Basophils Absolute: 0 10*3/uL (ref 0.0–0.1)
Basophils Relative: 0 % (ref 0–1)
Eosinophils Absolute: 0 10*3/uL (ref 0.0–0.7)
Lymphs Abs: 2.2 10*3/uL (ref 0.7–4.0)
Neutro Abs: 16.7 10*3/uL — ABNORMAL HIGH (ref 1.7–7.7)

## 2010-09-13 LAB — RENAL FUNCTION PANEL
Albumin: 1.4 g/dL — ABNORMAL LOW (ref 3.5–5.2)
BUN: 76 mg/dL — ABNORMAL HIGH (ref 6–23)
Calcium: 6.8 mg/dL — ABNORMAL LOW (ref 8.4–10.5)
Chloride: 100 mEq/L (ref 96–112)
Creatinine, Ser: 5.69 mg/dL — ABNORMAL HIGH (ref 0.4–1.5)
GFR calc Af Amer: 12 mL/min — ABNORMAL LOW (ref >60)
GFR calc non Af Amer: 10 mL/min — ABNORMAL LOW (ref >60)
Phosphorus: 4.1 mg/dL (ref 2.3–4.6)

## 2010-09-13 LAB — URINALYSIS, MICROSCOPIC ONLY
Bilirubin Urine: NEGATIVE
Hgb urine dipstick: NEGATIVE
Ketones, ur: NEGATIVE mg/dL
Specific Gravity, Urine: 1.011 (ref 1.005–1.030)
pH: 5 (ref 5.0–8.0)

## 2010-09-13 LAB — CULTURE, BAL-QUANTITATIVE W GRAM STAIN: Colony Count: >=100000

## 2010-09-13 LAB — COMPREHENSIVE METABOLIC PANEL
ALT: 12 U/L (ref 0–53)
AST: 20 U/L (ref 0–37)
CO2: 21 mEq/L (ref 19–32)
Calcium: 7.3 mg/dL — ABNORMAL LOW (ref 8.4–10.5)
Creatinine, Ser: 5.6 mg/dL — ABNORMAL HIGH (ref 0.4–1.5)
GFR calc Af Amer: 13 mL/min — ABNORMAL LOW (ref >60)
GFR calc non Af Amer: 10 mL/min — ABNORMAL LOW (ref >60)
Glucose, Bld: 148 mg/dL — ABNORMAL HIGH (ref 70–99)
Sodium: 128 mEq/L — ABNORMAL LOW (ref 135–145)
Total Protein: 6.1 g/dL (ref 6.0–8.3)

## 2010-09-13 LAB — GLUCOSE, CAPILLARY
Glucose-Capillary: 171 mg/dL — ABNORMAL HIGH (ref 70–99)
Glucose-Capillary: 177 mg/dL — ABNORMAL HIGH (ref 70–99)
Glucose-Capillary: 140 mg/dL — ABNORMAL HIGH (ref 70–99)

## 2010-09-13 LAB — CBC
MCH: 31.3 pg (ref 26.0–34.0)
MCHC: 35.9 g/dL (ref 30.0–36.0)
RDW: 13.6 % (ref 11.5–15.5)

## 2010-09-13 LAB — CREATININE, URINE, RANDOM: Creatinine, Urine: 33.9 mg/dL

## 2010-09-13 LAB — BASIC METABOLIC PANEL
BUN: 80 mg/dL — ABNORMAL HIGH (ref 6–23)
Calcium: 7.1 mg/dL — ABNORMAL LOW (ref 8.4–10.5)
Chloride: 99 mEq/L (ref 96–112)
Creatinine, Ser: 5.68 mg/dL — ABNORMAL HIGH (ref 0.4–1.5)

## 2010-09-13 LAB — MAGNESIUM: Magnesium: 1.8 mg/dL (ref 1.5–2.5)

## 2010-09-13 LAB — SODIUM, URINE, RANDOM: Sodium, Ur: 43 mEq/L

## 2010-09-13 LAB — OSMOLALITY: Osmolality: 287 mOsm/kg (ref 275–300)

## 2010-09-14 ENCOUNTER — Inpatient Hospital Stay (HOSPITAL_COMMUNITY): Payer: Medicaid Other

## 2010-09-14 LAB — GLUCOSE, CAPILLARY
Glucose-Capillary: 130 mg/dL — ABNORMAL HIGH (ref 70–99)
Glucose-Capillary: 139 mg/dL — ABNORMAL HIGH (ref 70–99)
Glucose-Capillary: 144 mg/dL — ABNORMAL HIGH (ref 70–99)

## 2010-09-14 LAB — RENAL FUNCTION PANEL
Albumin: 1.5 g/dL — ABNORMAL LOW (ref 3.5–5.2)
BUN: 81 mg/dL — ABNORMAL HIGH (ref 6–23)
CO2: 18 mEq/L — ABNORMAL LOW (ref 19–32)
Chloride: 100 mEq/L (ref 96–112)
GFR calc Af Amer: 12 mL/min — ABNORMAL LOW (ref >60)
GFR calc non Af Amer: 10 mL/min — ABNORMAL LOW (ref >60)
Potassium: 3.2 mEq/L — ABNORMAL LOW (ref 3.5–5.1)

## 2010-09-15 ENCOUNTER — Inpatient Hospital Stay (HOSPITAL_COMMUNITY): Payer: Medicaid Other

## 2010-09-15 DIAGNOSIS — N179 Acute kidney failure, unspecified: Secondary | ICD-10-CM

## 2010-09-15 DIAGNOSIS — J156 Pneumonia due to other aerobic Gram-negative bacteria: Secondary | ICD-10-CM

## 2010-09-15 DIAGNOSIS — F05 Delirium due to known physiological condition: Secondary | ICD-10-CM

## 2010-09-15 DIAGNOSIS — J9600 Acute respiratory failure, unspecified whether with hypoxia or hypercapnia: Secondary | ICD-10-CM

## 2010-09-15 LAB — GLUCOSE, CAPILLARY
Glucose-Capillary: 125 mg/dL — ABNORMAL HIGH (ref 70–99)
Glucose-Capillary: 126 mg/dL — ABNORMAL HIGH (ref 70–99)

## 2010-09-16 ENCOUNTER — Inpatient Hospital Stay (HOSPITAL_COMMUNITY): Payer: Medicaid Other

## 2010-09-16 LAB — CBC
MCH: 30.7 pg (ref 26.0–34.0)
MCHC: 34.6 g/dL (ref 30.0–36.0)
Platelets: 527 10*3/uL — ABNORMAL HIGH (ref 150–400)
RBC: 2.64 MIL/uL — ABNORMAL LOW (ref 4.22–5.81)
RDW: 15.3 % (ref 11.5–15.5)

## 2010-09-16 LAB — GLUCOSE, CAPILLARY
Glucose-Capillary: 107 mg/dL — ABNORMAL HIGH (ref 70–99)
Glucose-Capillary: 125 mg/dL — ABNORMAL HIGH (ref 70–99)
Glucose-Capillary: 131 mg/dL — ABNORMAL HIGH (ref 70–99)
Glucose-Capillary: 78 mg/dL (ref 70–99)
Glucose-Capillary: 89 mg/dL (ref 70–99)

## 2010-09-16 LAB — COMPREHENSIVE METABOLIC PANEL
AST: 15 U/L (ref 0–37)
Albumin: 2.1 g/dL — ABNORMAL LOW (ref 3.5–5.2)
BUN: 95 mg/dL — ABNORMAL HIGH (ref 6–23)
Calcium: 7 mg/dL — ABNORMAL LOW (ref 8.4–10.5)
Creatinine, Ser: 5.97 mg/dL — ABNORMAL HIGH (ref 0.4–1.5)
GFR calc Af Amer: 12 mL/min — ABNORMAL LOW (ref >60)

## 2010-09-16 NOTE — Consult Note (Addendum)
NAMESHAVEZ, MCGARTY              ACCOUNT NO.:  [id number]  MEDICAL RECORD NO.:  [id number]           PATIENT TYPE:  I  LOCATION:  IC03                          FACILITY:  [facility]  PHYSICIAN:  Daelen Belvedere L. Conley Canal, MDDATE OF BIRTH:  [birth year]  DATE OF CONSULTATION:  08/29/2010 DATE OF DISCHARGE:  08/29/2010                                CONSULTATION   REQUESTING PHYSICIAN:  Jamesetta So, MD  REASON FOR CONSULTATION:  Atrial fibrillation with rapid ventricular response.  IMPRESSION AND RECOMMENDATIONS: 1. New onset atrial fibrillation with rapid ventricular response     postoperatively:  The patient has received IV metoprolol.  And his     rate is in the 120S.  Apparently, he was up as high as 140 but not     much above this according to Dr. Patsey Berthold.  I recommend starting     the Cardizem drip rule out MI.  Check an echocardiogram and a TSH.     His electrolytes are unremarkable.  Hopefully, he will convert to     normal sinus rhythm and we can avoid anticoagulation.  This may be     related to the stress of surgery.  I agree with monitoring him in     the Intensive Care Unit where his Cardizem drip can be titrated for     rate control. 2. Type 2 diabetes for now until he is eating, I recommend NovoLog     sliding scale and I will check a hemoglobin A1c. 3. Hypertension:  Hold the lisinopril to avoid hypotension while on     Cardizem drip. 4. Status post laparoscopic incisional herniorrhaphy with mesh 5. Lingular nodularity on CT of the abdomen and pelvis last fall:  The     patient had apparently been recommended to get a dedicated CT of     the chest in a few weeks.  I do not see that this has been done.     This can be done as an inpatient or outpatient once his other     medical issues are stabilized.  HISTORY OF PRESENT ILLNESS:  Mr. Catania is a [place] 60 year old male who had surgery today by Dr. Arnoldo Morale.  He apparently had no problem in the operating room but in the  PACU after the procedure, he was noted to be tachycardic and was in atrial fibrillation.  He has received a total of 15 mg of IV metoprolol and his heart rate is currently in the 120s.  He denies any chest pain or shortness of breath.  He is complaining of pain after surgery.  He has no previous history of heart problems or dysrhythmias.  PAST MEDICAL HISTORY:  As above.  HOME MEDICATIONS: 1. Lisinopril 2.5 mg a day. 2. Fish oil capsules twice a day. 3. Metformin 500 mg 2 tablets in the morning and 1 in the evening. 4. Aspirin 81 mg a day.  ALLERGIES:  No known drug allergies.  SOCIAL HISTORY:  He does not work.  He is married.  His daughter is here.  He speaks limited [place].  He has lived in the [place] for  about 15 years.  He does not smoke, drink, or have a history of drug use.  FAMILY HISTORY:  Negative for heart problems, diabetes, and hypertension.  REVIEW OF SYSTEMS:  Systems reviewed and as above, otherwise negative.  PHYSICAL EXAMINATION:  VITAL SIGNS:  His heart rate is currently about 120, his blood pressure is 112/79, respiratory rate is about 16, and oxygen saturation 98%. GENERAL:  The patient is groggy Hispanic male who is easily arousable and able to answer questions. HEENT:  Pupils equal, round, reactive to light.  Sclerae nonicteric. Moist mucous membranes. NECK:  Supple.  No lymphadenopathy.  No carotid bruits.  No thyromegaly. LUNGS:  Clear to auscultation with poor inspiratory effort anteriorly. No wheezes, rhonchi, or rales. CARDIOVASCULAR:  Irregular rhythm, rapid rate. ABDOMEN:  Diminished bowel sounds, soft, incisions are without any drainage. GU and RECTAL:  Deferred. EXTREMITIES:  He has sequential compression devices in place.  No calf tenderness or swelling. NEUROLOGIC:  Groggy, arousable, no focal deficits. PSYCHIATRIC:  Calm and cooperative. SKIN:  Warm.  No rash.  LABORATORY DATA:  CBC significant for white blood cell count of  11,700 and 85% neutrophils.  Complete metabolic panel significant for a glucose of 219, magnesium is 1.7, and potassium is 4.5.  Cardiac enzymes were normal.  Chest x-ray shows low lung volumes, lung base density, likely reflecting atelectasis.  No CHF.  EKG shows atrial fibrillation with rapid ventricular response, rate were axis, low-voltage.  EKG from February 1 shows normal sinus rhythm, first-degree AV block.  Further recommendations to follow.  I have discussed the case with Dr. Arnoldo Morale and thank him for this consult.  I will continue to follow.     Christena Sunderlin L. Conley Canal, MD     CLS/MEDQ  D:  08/29/2010  T:  08/30/2010  Job:  [id number]  cc:   Jamesetta So, M.D. Fax: [phone]  [facility] Department  Dr. Patsey Berthold  Electronically Signed by Doree Barthel MD on 09/16/2010 09:26:41 PM

## 2010-09-17 ENCOUNTER — Other Ambulatory Visit (HOSPITAL_COMMUNITY): Payer: Self-pay

## 2010-09-17 ENCOUNTER — Inpatient Hospital Stay (HOSPITAL_COMMUNITY): Payer: Medicaid Other

## 2010-09-17 DIAGNOSIS — J9600 Acute respiratory failure, unspecified whether with hypoxia or hypercapnia: Secondary | ICD-10-CM

## 2010-09-17 DIAGNOSIS — N179 Acute kidney failure, unspecified: Secondary | ICD-10-CM

## 2010-09-17 DIAGNOSIS — F05 Delirium due to known physiological condition: Secondary | ICD-10-CM

## 2010-09-17 LAB — DIFFERENTIAL
Eosinophils Relative: 1 % (ref 0–5)
Lymphocytes Relative: 12 % (ref 12–46)
Monocytes Absolute: 0.4 10*3/uL (ref 0.1–1.0)
Monocytes Relative: 5 % (ref 3–12)
Neutro Abs: 6 10*3/uL (ref 1.7–7.7)

## 2010-09-17 LAB — COMPREHENSIVE METABOLIC PANEL
ALT: 9 U/L (ref 0–53)
AST: 16 U/L (ref 0–37)
Alkaline Phosphatase: 81 U/L (ref 39–117)
CO2: 14 mEq/L — ABNORMAL LOW (ref 19–32)
Calcium: 7.1 mg/dL — ABNORMAL LOW (ref 8.4–10.5)
Chloride: 104 mEq/L (ref 96–112)
GFR calc Af Amer: 10 mL/min — ABNORMAL LOW (ref >60)
GFR calc non Af Amer: 8 mL/min — ABNORMAL LOW (ref >60)
Potassium: 5.3 mEq/L — ABNORMAL HIGH (ref 3.5–5.1)
Sodium: 129 mEq/L — ABNORMAL LOW (ref 135–145)

## 2010-09-17 LAB — GLUCOSE, CAPILLARY
Glucose-Capillary: 69 mg/dL — ABNORMAL LOW (ref 70–99)
Glucose-Capillary: 77 mg/dL (ref 70–99)
Glucose-Capillary: 82 mg/dL (ref 70–99)
Glucose-Capillary: 94 mg/dL (ref 70–99)

## 2010-09-17 LAB — POCT I-STAT 3, ART BLOOD GAS (G3+)
Bicarbonate: 19.5 mEq/L — ABNORMAL LOW (ref 20.0–24.0)
Patient temperature: 98.7
TCO2: 15 mmol/L (ref 0–100)
TCO2: 20 mmol/L (ref 0–100)
pCO2 arterial: 26.1 mmHg — ABNORMAL LOW (ref 35.0–45.0)
pH, Arterial: 7.261 — ABNORMAL LOW (ref 7.350–7.450)
pH, Arterial: 7.482 — ABNORMAL HIGH (ref 7.350–7.450)

## 2010-09-17 LAB — CBC
HCT: 20.6 % — ABNORMAL LOW (ref 39.0–52.0)
Hemoglobin: 7.2 g/dL — ABNORMAL LOW (ref 13.0–17.0)
MCH: 31.3 pg (ref 26.0–34.0)
MCHC: 35 g/dL (ref 30.0–36.0)
MCV: 89.6 fL (ref 78.0–100.0)
RDW: 15.5 % (ref 11.5–15.5)

## 2010-09-17 LAB — PROTIME-INR: Prothrombin Time: 15.1 seconds (ref 11.6–15.2)

## 2010-09-17 LAB — HEPATITIS B SURFACE ANTIGEN: Hepatitis B Surface Ag: NEGATIVE

## 2010-09-17 LAB — NA AND K (SODIUM & POTASSIUM), RAND UR
Potassium Urine: 8 mEq/L
Sodium, Ur: 38 mEq/L

## 2010-09-17 LAB — OSMOLALITY: Osmolality: 304 mOsm/kg — ABNORMAL HIGH (ref 275–300)

## 2010-09-17 LAB — HEPATITIS B CORE ANTIBODY, TOTAL: Hep B Core Total Ab: NEGATIVE

## 2010-09-18 ENCOUNTER — Inpatient Hospital Stay (HOSPITAL_COMMUNITY): Payer: Medicaid Other

## 2010-09-18 LAB — BASIC METABOLIC PANEL
BUN: 65 mg/dL — ABNORMAL HIGH (ref 6–23)
Calcium: 7.5 mg/dL — ABNORMAL LOW (ref 8.4–10.5)
GFR calc non Af Amer: 12 mL/min — ABNORMAL LOW (ref >60)
Glucose, Bld: 134 mg/dL — ABNORMAL HIGH (ref 70–99)
Potassium: 4.2 mEq/L (ref 3.5–5.1)

## 2010-09-18 LAB — GLUCOSE, CAPILLARY: Glucose-Capillary: 116 mg/dL — ABNORMAL HIGH (ref 70–99)

## 2010-09-18 LAB — POCT I-STAT 3, ART BLOOD GAS (G3+)
Acid-base deficit: 12 mmol/L — ABNORMAL HIGH (ref 0.0–2.0)
Bicarbonate: 14.7 mEq/L — ABNORMAL LOW (ref 20.0–24.0)
pO2, Arterial: 106 mmHg — ABNORMAL HIGH (ref 80.0–100.0)

## 2010-09-18 LAB — CBC
HCT: 22.5 % — ABNORMAL LOW (ref 39.0–52.0)
MCH: 31.5 pg (ref 26.0–34.0)
MCHC: 35.6 g/dL (ref 30.0–36.0)
MCV: 88.6 fL (ref 78.0–100.0)
RDW: 15.2 % (ref 11.5–15.5)

## 2010-09-19 ENCOUNTER — Inpatient Hospital Stay (HOSPITAL_COMMUNITY): Payer: Medicaid Other

## 2010-09-19 LAB — GLUCOSE, CAPILLARY
Glucose-Capillary: 124 mg/dL — ABNORMAL HIGH (ref 70–99)
Glucose-Capillary: 137 mg/dL — ABNORMAL HIGH (ref 70–99)
Glucose-Capillary: 141 mg/dL — ABNORMAL HIGH (ref 70–99)

## 2010-09-19 LAB — CBC
MCV: 90.7 fL (ref 78.0–100.0)
Platelets: 495 10*3/uL — ABNORMAL HIGH (ref 150–400)
RBC: 2.46 MIL/uL — ABNORMAL LOW (ref 4.22–5.81)
WBC: 9.4 10*3/uL (ref 4.0–10.5)

## 2010-09-19 LAB — BASIC METABOLIC PANEL
BUN: 71 mg/dL — ABNORMAL HIGH (ref 6–23)
Chloride: 112 mEq/L (ref 96–112)
GFR calc Af Amer: 14 mL/min — ABNORMAL LOW (ref >60)
Potassium: 4.3 mEq/L (ref 3.5–5.1)
Sodium: 142 mEq/L (ref 135–145)

## 2010-09-19 LAB — CLOSTRIDIUM DIFFICILE BY PCR: Toxigenic C. Difficile by PCR: NEGATIVE

## 2010-09-20 ENCOUNTER — Inpatient Hospital Stay (HOSPITAL_COMMUNITY): Payer: Medicaid Other

## 2010-09-20 DIAGNOSIS — J9600 Acute respiratory failure, unspecified whether with hypoxia or hypercapnia: Secondary | ICD-10-CM

## 2010-09-20 DIAGNOSIS — F05 Delirium due to known physiological condition: Secondary | ICD-10-CM

## 2010-09-20 DIAGNOSIS — N179 Acute kidney failure, unspecified: Secondary | ICD-10-CM

## 2010-09-20 LAB — GLUCOSE, CAPILLARY
Glucose-Capillary: 161 mg/dL — ABNORMAL HIGH (ref 70–99)
Glucose-Capillary: 169 mg/dL — ABNORMAL HIGH (ref 70–99)

## 2010-09-20 LAB — CBC
Hemoglobin: 7.8 g/dL — ABNORMAL LOW (ref 13.0–17.0)
MCH: 31.2 pg (ref 26.0–34.0)
MCV: 90.8 fL (ref 78.0–100.0)
RBC: 2.5 MIL/uL — ABNORMAL LOW (ref 4.22–5.81)
WBC: 10.2 10*3/uL (ref 4.0–10.5)

## 2010-09-20 LAB — BASIC METABOLIC PANEL
BUN: 30 mg/dL — ABNORMAL HIGH (ref 6–23)
CO2: 25 mEq/L (ref 19–32)
Chloride: 108 mEq/L (ref 96–112)
Glucose, Bld: 168 mg/dL — ABNORMAL HIGH (ref 70–99)
Potassium: 3.1 mEq/L — ABNORMAL LOW (ref 3.5–5.1)
Sodium: 142 mEq/L (ref 135–145)

## 2010-09-20 LAB — PREALBUMIN: Prealbumin: 13 mg/dL — ABNORMAL LOW (ref 17.0–34.0)

## 2010-09-21 LAB — GLUCOSE, CAPILLARY
Glucose-Capillary: 170 mg/dL — ABNORMAL HIGH (ref 70–99)
Glucose-Capillary: 162 mg/dL — ABNORMAL HIGH (ref 70–99)

## 2010-09-21 LAB — BASIC METABOLIC PANEL
CO2: 25 mEq/L (ref 19–32)
Calcium: 7.7 mg/dL — ABNORMAL LOW (ref 8.4–10.5)
Creatinine, Ser: 3.58 mg/dL — ABNORMAL HIGH (ref 0.4–1.5)
GFR calc Af Amer: 21 mL/min — ABNORMAL LOW (ref >60)
Sodium: 147 mEq/L — ABNORMAL HIGH (ref 135–145)

## 2010-09-21 LAB — MRSA PCR SCREENING: MRSA by PCR: NEGATIVE

## 2010-09-21 LAB — MAGNESIUM: Magnesium: 2.4 mg/dL (ref 1.5–2.5)

## 2010-09-21 LAB — PHOSPHORUS: Phosphorus: 3.1 mg/dL (ref 2.3–4.6)

## 2010-09-22 ENCOUNTER — Inpatient Hospital Stay (HOSPITAL_COMMUNITY): Payer: Medicaid Other

## 2010-09-22 DIAGNOSIS — J156 Pneumonia due to other aerobic Gram-negative bacteria: Secondary | ICD-10-CM

## 2010-09-22 LAB — BASIC METABOLIC PANEL
Calcium: 7.5 mg/dL — ABNORMAL LOW (ref 8.4–10.5)
GFR calc non Af Amer: 16 mL/min — ABNORMAL LOW (ref >60)
Potassium: 3.6 mEq/L (ref 3.5–5.1)
Sodium: 149 mEq/L — ABNORMAL HIGH (ref 135–145)

## 2010-09-22 LAB — GLUCOSE, CAPILLARY
Glucose-Capillary: 106 mg/dL — ABNORMAL HIGH (ref 70–99)
Glucose-Capillary: 136 mg/dL — ABNORMAL HIGH (ref 70–99)
Glucose-Capillary: 138 mg/dL — ABNORMAL HIGH (ref 70–99)
Glucose-Capillary: 157 mg/dL — ABNORMAL HIGH (ref 70–99)

## 2010-09-22 LAB — CBC
MCHC: 32.8 g/dL (ref 30.0–36.0)
Platelets: 262 10*3/uL (ref 150–400)
RDW: 16 % — ABNORMAL HIGH (ref 11.5–15.5)
WBC: 14.6 10*3/uL — ABNORMAL HIGH (ref 4.0–10.5)

## 2010-09-23 ENCOUNTER — Inpatient Hospital Stay (HOSPITAL_COMMUNITY): Payer: Medicaid Other

## 2010-09-23 LAB — CBC
MCH: 31.5 pg (ref 26.0–34.0)
MCHC: 33.2 g/dL (ref 30.0–36.0)
MCV: 94.9 fL (ref 78.0–100.0)
Platelets: 238 10*3/uL (ref 150–400)
RBC: 2.57 MIL/uL — ABNORMAL LOW (ref 4.22–5.81)
RDW: 16.2 % — ABNORMAL HIGH (ref 11.5–15.5)

## 2010-09-23 LAB — GLUCOSE, CAPILLARY
Glucose-Capillary: 122 mg/dL — ABNORMAL HIGH (ref 70–99)
Glucose-Capillary: 123 mg/dL — ABNORMAL HIGH (ref 70–99)
Glucose-Capillary: 142 mg/dL — ABNORMAL HIGH (ref 70–99)

## 2010-09-23 LAB — BASIC METABOLIC PANEL
BUN: 60 mg/dL — ABNORMAL HIGH (ref 6–23)
Calcium: 7.5 mg/dL — ABNORMAL LOW (ref 8.4–10.5)
Creatinine, Ser: 3.91 mg/dL — ABNORMAL HIGH (ref 0.4–1.5)
GFR calc Af Amer: 19 mL/min — ABNORMAL LOW (ref >60)
GFR calc non Af Amer: 16 mL/min — ABNORMAL LOW (ref >60)

## 2010-09-23 LAB — CARDIAC PANEL(CRET KIN+CKTOT+MB+TROPI)
Relative Index: INVALID (ref 0.0–2.5)
Troponin I: 0.04 ng/mL (ref 0.00–0.06)

## 2010-09-24 ENCOUNTER — Inpatient Hospital Stay (HOSPITAL_COMMUNITY): Payer: Medicaid Other

## 2010-09-24 DIAGNOSIS — I4891 Unspecified atrial fibrillation: Secondary | ICD-10-CM

## 2010-09-24 LAB — BLOOD GAS, ARTERIAL
Delivery systems: POSITIVE
Inspiratory PAP: 10
O2 Saturation: 99.6 %
Patient temperature: 98.6
Pressure support: 5 cmH2O
TCO2: 25.7 mmol/L (ref 0–100)
pH, Arterial: 7.514 — ABNORMAL HIGH (ref 7.350–7.450)

## 2010-09-24 LAB — CARDIAC PANEL(CRET KIN+CKTOT+MB+TROPI)
CK, MB: 2.5 ng/mL (ref 0.3–4.0)
Relative Index: INVALID (ref 0.0–2.5)
Relative Index: INVALID (ref 0.0–2.5)
Troponin I: 0.04 ng/mL (ref 0.00–0.06)

## 2010-09-24 LAB — BASIC METABOLIC PANEL
BUN: 76 mg/dL — ABNORMAL HIGH (ref 6–23)
CO2: 24 mEq/L (ref 19–32)
Calcium: 7.6 mg/dL — ABNORMAL LOW (ref 8.4–10.5)
Creatinine, Ser: 4.24 mg/dL — ABNORMAL HIGH (ref 0.4–1.5)
GFR calc Af Amer: 17 mL/min — ABNORMAL LOW (ref >60)
Glucose, Bld: 130 mg/dL — ABNORMAL HIGH (ref 70–99)

## 2010-09-24 LAB — DIFFERENTIAL
Basophils Absolute: 0.1 10*3/uL (ref 0.0–0.1)
Basophils Relative: 1 % (ref 0–1)
Neutro Abs: 8.9 10*3/uL — ABNORMAL HIGH (ref 1.7–7.7)
Neutrophils Relative %: 82 % — ABNORMAL HIGH (ref 43–77)

## 2010-09-24 LAB — RENAL FUNCTION PANEL
Albumin: 2.4 g/dL — ABNORMAL LOW (ref 3.5–5.2)
BUN: 71 mg/dL — ABNORMAL HIGH (ref 6–23)
Creatinine, Ser: 4.22 mg/dL — ABNORMAL HIGH (ref 0.4–1.5)
Phosphorus: 5.8 mg/dL — ABNORMAL HIGH (ref 2.3–4.6)

## 2010-09-24 LAB — CBC
Hemoglobin: 7.4 g/dL — ABNORMAL LOW (ref 13.0–17.0)
RBC: 2.36 MIL/uL — ABNORMAL LOW (ref 4.22–5.81)
WBC: 10.9 10*3/uL — ABNORMAL HIGH (ref 4.0–10.5)

## 2010-09-24 LAB — GLUCOSE, CAPILLARY
Glucose-Capillary: 127 mg/dL — ABNORMAL HIGH (ref 70–99)
Glucose-Capillary: 124 mg/dL — ABNORMAL HIGH (ref 70–99)

## 2010-09-24 LAB — MAGNESIUM: Magnesium: 2.3 mg/dL (ref 1.5–2.5)

## 2010-09-24 LAB — PREALBUMIN: Prealbumin: 14 mg/dL — ABNORMAL LOW (ref 17.0–34.0)

## 2010-09-25 ENCOUNTER — Inpatient Hospital Stay (HOSPITAL_COMMUNITY): Payer: Medicaid Other

## 2010-09-25 ENCOUNTER — Inpatient Hospital Stay (HOSPITAL_COMMUNITY): Payer: Self-pay

## 2010-09-25 DIAGNOSIS — F05 Delirium due to known physiological condition: Secondary | ICD-10-CM

## 2010-09-25 DIAGNOSIS — I4891 Unspecified atrial fibrillation: Secondary | ICD-10-CM

## 2010-09-25 DIAGNOSIS — J9600 Acute respiratory failure, unspecified whether with hypoxia or hypercapnia: Secondary | ICD-10-CM

## 2010-09-25 DIAGNOSIS — J810 Acute pulmonary edema: Secondary | ICD-10-CM

## 2010-09-25 LAB — GLUCOSE, CAPILLARY
Glucose-Capillary: 133 mg/dL — ABNORMAL HIGH (ref 70–99)
Glucose-Capillary: 128 mg/dL — ABNORMAL HIGH (ref 70–99)

## 2010-09-25 LAB — CBC
Hemoglobin: 8.5 g/dL — ABNORMAL LOW (ref 13.0–17.0)
Platelets: 250 10*3/uL (ref 150–400)
RBC: 2.69 MIL/uL — ABNORMAL LOW (ref 4.22–5.81)
WBC: 12.7 10*3/uL — ABNORMAL HIGH (ref 4.0–10.5)

## 2010-09-25 LAB — BRAIN NATRIURETIC PEPTIDE: Pro B Natriuretic peptide (BNP): 1224 pg/mL — ABNORMAL HIGH (ref 0.0–100.0)

## 2010-09-25 LAB — BASIC METABOLIC PANEL
CO2: 24 mEq/L (ref 19–32)
Chloride: 118 mEq/L — ABNORMAL HIGH (ref 96–112)
GFR calc Af Amer: 17 mL/min — ABNORMAL LOW (ref >60)
Sodium: 158 mEq/L — ABNORMAL HIGH (ref 135–145)

## 2010-09-26 DIAGNOSIS — N179 Acute kidney failure, unspecified: Secondary | ICD-10-CM

## 2010-09-26 LAB — CBC
Platelets: 247 10*3/uL (ref 150–400)
RBC: 2.74 MIL/uL — ABNORMAL LOW (ref 4.22–5.81)
RDW: 16.8 % — ABNORMAL HIGH (ref 11.5–15.5)
WBC: 10.7 10*3/uL — ABNORMAL HIGH (ref 4.0–10.5)

## 2010-09-26 LAB — BASIC METABOLIC PANEL
Chloride: 118 mEq/L — ABNORMAL HIGH (ref 96–112)
GFR calc Af Amer: 17 mL/min — ABNORMAL LOW (ref >60)
GFR calc non Af Amer: 14 mL/min — ABNORMAL LOW (ref >60)
Potassium: 3.8 mEq/L (ref 3.5–5.1)
Sodium: 156 mEq/L — ABNORMAL HIGH (ref 135–145)

## 2010-09-26 LAB — AMIODARONE LEVEL
Amiodarone Lvl: 0.5 ug/mL — ABNORMAL LOW (ref 1.5–2.5)
N-Desethyl-Amiodarone: <0.3 ug/mL — ABNORMAL LOW (ref 1.5–2.5)

## 2010-09-27 ENCOUNTER — Inpatient Hospital Stay (HOSPITAL_COMMUNITY): Payer: Medicaid Other

## 2010-09-27 LAB — RENAL FUNCTION PANEL
Albumin: 2.6 g/dL — ABNORMAL LOW (ref 3.5–5.2)
BUN: 70 mg/dL — ABNORMAL HIGH (ref 6–23)
Calcium: 7.1 mg/dL — ABNORMAL LOW (ref 8.4–10.5)
Phosphorus: 4.7 mg/dL — ABNORMAL HIGH (ref 2.3–4.6)
Potassium: 3.1 mEq/L — ABNORMAL LOW (ref 3.5–5.1)
Sodium: 150 mEq/L — ABNORMAL HIGH (ref 135–145)

## 2010-09-27 LAB — MAGNESIUM: Magnesium: 1.9 mg/dL (ref 1.5–2.5)

## 2010-09-27 LAB — GLUCOSE, CAPILLARY: Glucose-Capillary: 213 mg/dL — ABNORMAL HIGH (ref 70–99)

## 2010-09-28 ENCOUNTER — Inpatient Hospital Stay (HOSPITAL_COMMUNITY): Payer: Medicaid Other

## 2010-09-28 LAB — RENAL FUNCTION PANEL
Albumin: 2.5 g/dL — ABNORMAL LOW (ref 3.5–5.2)
CO2: 27 mEq/L (ref 19–32)
Chloride: 117 mEq/L — ABNORMAL HIGH (ref 96–112)
Creatinine, Ser: 4.21 mg/dL — ABNORMAL HIGH (ref 0.4–1.5)
GFR calc Af Amer: 17 mL/min — ABNORMAL LOW (ref >60)
GFR calc non Af Amer: 14 mL/min — ABNORMAL LOW (ref >60)
Potassium: 3.5 mEq/L (ref 3.5–5.1)

## 2010-09-28 LAB — GLUCOSE, CAPILLARY
Glucose-Capillary: 169 mg/dL — ABNORMAL HIGH (ref 70–99)
Glucose-Capillary: 223 mg/dL — ABNORMAL HIGH (ref 70–99)
Glucose-Capillary: 174 mg/dL — ABNORMAL HIGH (ref 70–99)
Glucose-Capillary: 167 mg/dL — ABNORMAL HIGH (ref 70–99)

## 2010-09-28 LAB — MAGNESIUM: Magnesium: 2.2 mg/dL (ref 1.5–2.5)

## 2010-09-28 LAB — CBC
HCT: 25.7 % — ABNORMAL LOW (ref 39.0–52.0)
Hemoglobin: 8.3 g/dL — ABNORMAL LOW (ref 13.0–17.0)
MCHC: 32.3 g/dL (ref 30.0–36.0)
RBC: 2.64 MIL/uL — ABNORMAL LOW (ref 4.22–5.81)

## 2010-09-29 LAB — RENAL FUNCTION PANEL
BUN: 65 mg/dL — ABNORMAL HIGH (ref 6–23)
CO2: 26 mEq/L (ref 19–32)
Calcium: 7.7 mg/dL — ABNORMAL LOW (ref 8.4–10.5)
Creatinine, Ser: 4.08 mg/dL — ABNORMAL HIGH (ref 0.4–1.5)
Glucose, Bld: 154 mg/dL — ABNORMAL HIGH (ref 70–99)
Phosphorus: 4.9 mg/dL — ABNORMAL HIGH (ref 2.3–4.6)

## 2010-09-29 LAB — GLUCOSE, CAPILLARY
Glucose-Capillary: 159 mg/dL — ABNORMAL HIGH (ref 70–99)
Glucose-Capillary: 242 mg/dL — ABNORMAL HIGH (ref 70–99)
Glucose-Capillary: 158 mg/dL — ABNORMAL HIGH (ref 70–99)

## 2010-09-29 LAB — CULTURE, BLOOD (ROUTINE X 2)
Culture  Setup Time: 201203041757
Culture: NO GROWTH

## 2010-09-30 LAB — CBC
Hemoglobin: 9.1 g/dL — ABNORMAL LOW (ref 13.0–17.0)
MCH: 30.7 pg (ref 26.0–34.0)
MCHC: 32.2 g/dL (ref 30.0–36.0)
RDW: 15.4 % (ref 11.5–15.5)

## 2010-09-30 LAB — BASIC METABOLIC PANEL
CO2: 30 mEq/L (ref 19–32)
Calcium: 7.9 mg/dL — ABNORMAL LOW (ref 8.4–10.5)
Creatinine, Ser: 3.98 mg/dL — ABNORMAL HIGH (ref 0.4–1.5)
GFR calc Af Amer: 19 mL/min — ABNORMAL LOW (ref >60)
GFR calc non Af Amer: 15 mL/min — ABNORMAL LOW (ref >60)

## 2010-09-30 LAB — GLUCOSE, CAPILLARY
Glucose-Capillary: 129 mg/dL — ABNORMAL HIGH (ref 70–99)
Glucose-Capillary: 156 mg/dL — ABNORMAL HIGH (ref 70–99)

## 2010-10-01 LAB — GLUCOSE, CAPILLARY
Glucose-Capillary: 215 mg/dL — ABNORMAL HIGH (ref 70–99)
Glucose-Capillary: 135 mg/dL — ABNORMAL HIGH (ref 70–99)
Glucose-Capillary: 151 mg/dL — ABNORMAL HIGH (ref 70–99)
Glucose-Capillary: 197 mg/dL — ABNORMAL HIGH (ref 70–99)

## 2010-10-01 LAB — BASIC METABOLIC PANEL
CO2: 28 mEq/L (ref 19–32)
Calcium: 7.7 mg/dL — ABNORMAL LOW (ref 8.4–10.5)
Creatinine, Ser: 3.62 mg/dL — ABNORMAL HIGH (ref 0.4–1.5)
GFR calc Af Amer: 21 mL/min — ABNORMAL LOW (ref >60)
GFR calc non Af Amer: 17 mL/min — ABNORMAL LOW (ref >60)
Glucose, Bld: 157 mg/dL — ABNORMAL HIGH (ref 70–99)
Sodium: 146 mEq/L — ABNORMAL HIGH (ref 135–145)

## 2010-10-01 LAB — CBC
Hemoglobin: 8.6 g/dL — ABNORMAL LOW (ref 13.0–17.0)
MCH: 31.2 pg (ref 26.0–34.0)
MCHC: 33.2 g/dL (ref 30.0–36.0)
RDW: 14.8 % (ref 11.5–15.5)

## 2010-10-01 LAB — DIFFERENTIAL
Basophils Absolute: 0 10*3/uL (ref 0.0–0.1)
Basophils Relative: 0 % (ref 0–1)
Eosinophils Relative: 6 % — ABNORMAL HIGH (ref 0–5)
Monocytes Absolute: 0.4 10*3/uL (ref 0.1–1.0)
Monocytes Relative: 6 % (ref 3–12)
Neutro Abs: 4.7 10*3/uL (ref 1.7–7.7)

## 2010-10-01 NOTE — Consult Note (Signed)
[name], [name]              ACCOUNT NO.:  [id number]  MEDICAL RECORD NO.:  [id number]           PATIENT TYPE:  LOCATION:                                 FACILITY:  PHYSICIAN:  Adin Hector, MD     DATE OF BIRTH:  1951-01-19  DATE OF CONSULTATION:  09/03/2010 DATE OF DISCHARGE:                                CONSULTATION   TIME OF CONSULTATION:  12:31 p.m.  REQUESTING PHYSICIAN:  Donald Prose, MD  CONSULTING SURGEON:  Adin Hector, MD  PRIMARY SURGEON:  Jamesetta So, MD, at [facility].  REASON FOR CONSULTATION:  Questionable wound infection.  HISTORY OF PRESENT ILLNESS:  [name] is a 60 year old Hispanic male, who has a history of diabetes and hypertension, who had a right lower quadrant hernia since an open appendectomy in the past.  All of his history is obtained from his daughters as he is sedated on the ventilator.  Apparently, he had one repair which failed in the past. Dr. Arnoldo Morale performed a laparoscopic ventral hernia repair with Physiomesh on August 29, 2010.  Postoperatively, the patient developed atrial fibrillation with RVR.  He subsequently developed asymptomatic hypoxia.  A CT scan of the chest was obtained which was negative for pulmonary embolus.  The patient continued to have AFib with RVR and eventually underwent cardioversion but has since had atrial fibrillation on and off since then.  At this time, the patient was transferred to [facility] for further management of his atrial fibrillation.  Upon arrival, the patient developed respiratory failure and was subsequently intubated.  Today, his abdomen appeared to be erythematous and warm in the right lower quadrant.  We were asked to evaluate the patient to rule out infection.  REVIEW OF SYSTEMS:  Unable to obtain as the patient is sedated on the ventilator.  FAMILY HISTORY:  Noncontributory.  PAST MEDICAL HISTORY: 1. Hypertension. 2. Diabetes mellitus.  PAST SURGICAL HISTORY: 1. Open  appendectomy. 2. Incisional hernia repair. 3. Recent laparoscopic incisional ventral hernia repair with     Physiomesh.  SOCIAL HISTORY:  The patient lives with his wife.  He does not smoke tobacco or drink alcohol.  He does not work.  MEDICATIONS AT HOME: 1. Metformin 500 mg one to two p.o. b.i.d. 2. Lisinopril 2.5 mg daily. 3. Fish oil one tablet b.i.d. 4. Aspirin 81 mg daily.  ALLERGIES:  No known drug allergies.  PHYSICAL EXAMINATION:  GENERAL:  [name] is a 60 year old Hispanic male who is currently sedated on the ventilator. VITAL SIGNS:  Temperature 101.1, pulse 82, blood pressure 94/58, respirations 16% on 50% ventilator. HEENT:  Head is normocephalic, atraumatic.  Eyes are not evaluated as they are currently closed.  Ears and nose without any obvious masses or lesions.  Mouth is pink.  He does have an ET tube in place, as well as an OG in place with bilious output. HEART:  Currently normal sinus rhythm.  No murmurs, gallops, or rubs are noted.  He does have palpable carotid, radial, and pedal pulses bilaterally. LUNGS:  Clear to auscultation bilaterally with no wheezes, rhonchi, or rales noted.  Respiratory  effort is nonlabored on the ventilator. ABDOMEN:  Soft unable to determine tenderness secondary to sedation.  He has a few active bowel sounds.  There is an area of bulge fluid wave consistent with postoperative fluid collection in the right lower quadrant.  Just below this in the right lower quadrant is an area of erythema and warmth with mild induration.  All of his port sites are clean, dry, and intact with staples present.  No other masses or hernias are noted. MUSCULOSKELETAL:  All four extremities are symmetrical with some upper extremity edema, otherwise, no cyanosis or clubbing is noted. NEUROLOGIC:  Unable to assess secondary to sedation. PSYCH:  Also unable to assess secondary to sedation.  LABORATORY DATA AND DIAGNOSTICS:  Heparin level is less than  0.1.  PH is 7.481, pCO2 is 36, pO2 is 69, bicarbonate is 26.  Cardiac panel is negative.  Procalcitonin is 11.5.  Sodium 137, potassium 3.8, glucose 140, BUN 39, creatinine 1.14.  White blood cell count is 8500, hemoglobin 13.3, hematocrit 37.5, platelet count is 192,000. Diagnostics none of the abdomen has been obtained so far.  IMPRESSION: 1. Status post laparoscopic ventral hernia repair with Physiomesh. 2. Hypoxemia, now with ventilator-dependent respiratory failure. 3. Questionable infected fluid collection versus infected mesh. 4. Hypertension. 5. Diabetes mellitus. 6. New-onset atrial fibrillation with rapid ventricular response.  PLAN:  I have discussed this patient with Dr. Johney Maine.  We will proceed with a CT scan of the abdomen and pelvis to help determine what is going on in the patient's abdomen.  We want to rule out a possible bowel leak as the patient did have a mild serosal tear during his first surgical intervention that may result in an infected mesh versus an infected fluid collection such as a hematoma or seroma.  We have discussed this plan with the patient's family who understands and wishes to proceed. Currently, the patient is on full anticoagulation with heparin.  If surgical intervention will be warranted, this would need to be held.  We will follow the patient along with you.     Henreitta Cea, PA   ______________________________ Adin Hector, MD    KEO/MEDQ  D:  09/03/2010  T:  09/04/2010  Job:  [id number]  cc:   Jamesetta So, M.D. Donald Prose, M.D.  Electronically Signed by Saverio Danker PA on 09/14/2010 07:18:38 AM Electronically Signed by Michael Boston MD on 10/01/2010 12:41:12 PM

## 2010-10-02 DIAGNOSIS — E669 Obesity, unspecified: Secondary | ICD-10-CM

## 2010-10-02 DIAGNOSIS — J9600 Acute respiratory failure, unspecified whether with hypoxia or hypercapnia: Secondary | ICD-10-CM

## 2010-10-02 DIAGNOSIS — R5381 Other malaise: Secondary | ICD-10-CM

## 2010-10-02 LAB — GLUCOSE, CAPILLARY: Glucose-Capillary: 141 mg/dL — ABNORMAL HIGH (ref 70–99)

## 2010-10-03 LAB — COMPREHENSIVE METABOLIC PANEL
ALT: 15 U/L (ref 0–53)
AST: 19 U/L (ref 0–37)
Albumin: 2.5 g/dL — ABNORMAL LOW (ref 3.5–5.2)
Chloride: 111 mEq/L (ref 96–112)
Creatinine, Ser: 3.2 mg/dL — ABNORMAL HIGH (ref 0.4–1.5)
GFR calc Af Amer: 24 mL/min — ABNORMAL LOW (ref >60)
Potassium: 3 mEq/L — ABNORMAL LOW (ref 3.5–5.1)
Sodium: 142 mEq/L (ref 135–145)
Total Bilirubin: 0.6 mg/dL (ref 0.3–1.2)

## 2010-10-03 LAB — DIFFERENTIAL
Basophils Absolute: 0 10*3/uL (ref 0.0–0.1)
Basophils Relative: 1 % (ref 0–1)
Eosinophils Absolute: 0.3 10*3/uL (ref 0.0–0.7)
Neutro Abs: 3.4 10*3/uL (ref 1.7–7.7)
Neutrophils Relative %: 50 % (ref 43–77)

## 2010-10-03 LAB — CBC
MCH: 31 pg (ref 26.0–34.0)
Platelets: 267 10*3/uL (ref 150–400)
RBC: 2.71 MIL/uL — ABNORMAL LOW (ref 4.22–5.81)
WBC: 6.6 10*3/uL (ref 4.0–10.5)

## 2010-10-09 NOTE — Discharge Summary (Signed)
  [name], [name]              ACCOUNT NO.:  [id number]  MEDICAL RECORD NO.:  [id number]           PATIENT TYPE:  I  LOCATION:  2019                         FACILITY:  [facility]  PHYSICIAN:  Sarajane Jews, MD     DATE OF BIRTH:  1951-01-20  DATE OF ADMISSION:  09/02/2010 DATE OF DISCHARGE:  10/03/2010                              DISCHARGE SUMMARY   ADDENDUM  FINAL DIAGNOSES: 1. Paroxysmal atrial fibrillation. 2. Acute renal failure. 3. Hyponatremia, resolved. 4. Hypokalemia. 5. Status post laparoscopic repair of ventral hernia with     postoperative wound infection. 6. Anemia. 7. Diabetes type 2. 8. Resolved delirium. 9. Resolved acute respiratory failure.  MEDICATIONS ON DISCHARGE: 1. Clonidine 0.1 mg by mouth b.i.d. 2. __________ 100 mg b.i.d. 3. Amaryl 1 mg daily. 4. Percocet 2.5/167.5 by mouth as needed. 5. Metoprolol 100 mg p.o. b.i.d. 6. Aspirin 81 mg daily. 7. Fish oil b.i.d. 8. Lisinopril 2.5 mg daily.  SUB MEDICATION:  Metformin 500 mg daily.  DISPOSITION:  Home with home health.  FOLLOWUP:  Follow up with Dr. Johney Maine, the surgeon at [phone] two weeks after discharge.  Follow up with Dr. Hassell Done, [id number] on October 10, 2010.         ______________________________ Sarajane Jews, MD    SA/MEDQ  D:  10/03/2010  T:  10/04/2010  Job:  [id number]  Electronically Signed by Sarajane Jews MD on 10/09/2010 03:52:51 PM

## 2010-10-10 NOTE — Consult Note (Addendum)
[name], [name]              ACCOUNT NO.:  [id number]  MEDICAL RECORD NO.:  [id number]           PATIENT TYPE:  I  LOCATION:  IC03                          FACILITY:  [facility]  PHYSICIAN:  Cristopher Estimable. Lattie Haw, MD, FACCDATE OF BIRTH:  12-14-1950  DATE OF CONSULTATION:  08/31/2010 DATE OF DISCHARGE:                                CONSULTATION   PRIMARY CARDIOLOGIST:  Georgina Snell. Lattie Haw, MD, [facility]  REQUESTING PHYSICIAN:  [facility] [employer].  PRIMARY CARE PHYSICIAN:  He does not have one listed.  REASON FOR CONSULTATION:  AFib, RVR postoperatively.  HISTORY OF PRESENT ILLNESS:  This 60 year old Hispanic male without prior cardiac history with a history of type 2 diabetes and hypertension, who was admitted for a ventral hernia repair.  The patient did well during the surgery and immediately postoperatively.  However, while recovering out on the floor, the patient had an episode of atrial fibrillation with a heart rate in the 170s.  The patient was given IV Cardizem and placed on a drip.  His heart rate did normalized at one point; however, it continued to go up again.  He has been moved to the unit where he continues on Cardizem drip at 15 mg an hour with a heart rate running between 160 and 177.  He is short of breath and has been placed on a Ventimask.  The patient did have a D-dimer drawn, which was negative.  He did have a followup CT scan of the chest to rule out PE, which was also found to be negative.  The patient denies any chest pain. He does not speak [place].  His daughter is at bedside, who interprets for him.  He has had no prior cardiac evaluation or history.  The patient is medically compliant.  He is followed for his diabetes and hypertension history as an outpatient.  REVIEW OF SYSTEMS:  Positive for diaphoresis and shortness of breath. The patient is unaware of how fast his heart rate is going nor does he feel it.  He denies nausea or vomiting.  He  denies dizziness.  All other systems are reviewed and are found to be negative.  PAST MEDICAL HISTORY:  Type 2 diabetes and hypertension.  PAST SURGICAL HISTORY:  Ventral hernia repair this admission.  SOCIAL HISTORY:  He lives in [place] with his wife.  He does not smoke, drink, or use drugs.  FAMILY HISTORY:  Mother with a myocardial infarction.  Father deceased with an MI.  MEDICATIONS PRIOR TO ADMISSION: 1. Lisinopril 2.5 mg daily. 2. Fish oil b.i.d. 3. Metformin 500 mg two in the morning and one in the evening. 4. Aspirin 81 mg daily.  ALLERGIES:  No known drug allergies.  CURRENT LABS:  Sodium 137, potassium 3.7, chloride 103, CO2 of 23, BUN 27, creatinine 1.0, glucose 148.  Hemoglobin 15.1, hematocrit 41.9, white blood cells 12.5, platelets 171.  TSH 3.5.  Hemoglobin A1c 8.2. BNP 100.  Initial magnesium on admission was 1.4, it has been repleted and is now 2.1, calcium 7.8.  Troponin 0.01 and 0.01 respectively.  pH 7.41, pCO2 of 20.1, pO2 of 48.9, bicarb 23.2.  EKG revealing atrial fibrillation with RVR with a rate of 164 beats per minute with inferior T-wave depression and flattening noted.  Chest x-ray stable with bibasilar atelectasis.  CT scan negative for PE.  PHYSICAL EXAMINATION:  VITAL SIGNS:  Blood pressure 135/83, pulse 162, respirations 33, temperature 98.1, O2 sats 84% on 100% non-rebreather, weight 108.5 kg. GENERAL:  He is awake, alert and oriented, dyspneic. HEENT:  Head is normocephalic and atraumatic.  Eyes:  PERRLA.  Mucous membranes and mouth are pink and moist.  Tongue is midline. NECK:  Supple without JVD.  No carotid bruits appreciated. CARDIOVASCULAR:  Tachycardic, irregular, pulses are equal without bruits. LUNGS:  Clear to auscultation without wheezes, rales, or rhonchi. ABDOMEN:  Soft, nontender, 1+ bowel sounds.  He has an abdominal binder in place.  EXTREMITIES:  Without clubbing, cyanosis, or edema. MUSCULOSKELETAL:  No joint deformity or  effusions. NEUROLOGIC:  Cranial nerves II through XII are grossly intact.  IMPRESSION: 1. New onset atrial fibrillation with rapid ventricular response     postoperatively, believed to be related to catecholamine increase     postoperatively.  He has a negative CT for pulmonary embolism.     Cardiac enzymes are negative so far.  He has nonspecific EKG     changes inferiorly.  We will repeat EKG once heart rate is     normalized.  We will give him an additional 20 mg IV bolus and     increase the Cardizem to 20 mg an hour.  If heart rate does not     normalize within a reasonable period of time, we will give IV     Lopressor x1.  We will continue to cycle his cardiac enzymes and     increase his Lovenox to b.i.d. dosing.  The patient will have an     echocardiogram for left ventricular function in the setting.  If     not converted within 48 hours, can consider amiodarone but would     avoid this secondary to toxicity issues long term. 2. History of hypertension.  Blood pressure is labile with Cardizem     drip and elevated heart rate.  He is currently on lisinopril 2.5 mg     prior to this admit. 3. Rule out coronary artery disease.  Cardiovascular risk factors     include family history, unknown cholesterol, hypertension,     diabetes, and age.  Once stable, will need to have further cardiac     workup with a stress test versus a cardiac catheterization should     cardiac enzymes become positive.  On behalf of the physicians and providers of [place], we would like to thank the [facility] for allowing [place] to participate in the care of this patient.     Phill Myron. Purcell Nails, NP   ______________________________ Cristopher Estimable. Lattie Haw, MD, [facility]    KML/MEDQ  D:  08/31/2010  T:  09/01/2010  Job:  [id number]  cc:   [facility]  Electronically Signed by Jory Sims NP on 09/10/2010 04:36:45 PM Electronically Signed by Jacqulyn Ducking MD [facility] on  10/10/2010 06:40:11 PM

## 2010-10-11 LAB — COMPREHENSIVE METABOLIC PANEL
AST: 13 U/L (ref 0–37)
Albumin: 2.1 g/dL — ABNORMAL LOW (ref 3.5–5.2)
Chloride: 101 mEq/L (ref 96–112)
Creatinine, Ser: 5.84 mg/dL — ABNORMAL HIGH (ref 0.4–1.5)
GFR calc Af Amer: 12 mL/min — ABNORMAL LOW (ref >60)
Potassium: 3.3 mEq/L — ABNORMAL LOW (ref 3.5–5.1)
Total Bilirubin: 0.5 mg/dL (ref 0.3–1.2)

## 2010-10-11 LAB — CBC
MCH: 30.5 pg (ref 26.0–34.0)
Platelets: 456 10*3/uL — ABNORMAL HIGH (ref 150–400)
RBC: 2.39 MIL/uL — ABNORMAL LOW (ref 4.22–5.81)
WBC: 8.3 10*3/uL (ref 4.0–10.5)

## 2010-10-11 LAB — TRIGLYCERIDES: Triglycerides: 112 mg/dL (ref <150)

## 2010-10-11 LAB — DIFFERENTIAL
Basophils Relative: 1 % (ref 0–1)
Eosinophils Absolute: 0.3 10*3/uL (ref 0.0–0.7)
Neutrophils Relative %: 76 % (ref 43–77)

## 2010-10-11 LAB — PHOSPHORUS: Phosphorus: 5.3 mg/dL — ABNORMAL HIGH (ref 2.3–4.6)

## 2010-10-11 LAB — CK TOTAL AND CKMB (NOT AT ARMC): CK, MB: 7.1 ng/mL (ref 0.3–4.0)

## 2010-10-11 NOTE — Discharge Summary (Signed)
NAMEAMISH, DEFENBAUGH              ACCOUNT NO.:  [id number]  MEDICAL RECORD NO.:  [id number]           PATIENT TYPE:  I  LOCATION:  2915                         FACILITY:  [facility]  PHYSICIAN:  Chesley Mires, MD        DATE OF BIRTH:  09-Jul-1951  DATE OF ADMISSION:  09/02/2010 DATE OF DISCHARGE:  09/28/2010                              DISCHARGE SUMMARY   FINAL DIAGNOSES: 1. Paroxysmal atrial fibrillation. 2. Acute renal failure. 3. Hypernatremia. 4. Hypokalemia. 5. Status post laparoscopic repair of ventral hernia with     postoperative wound/infection. 6. Anemia of critical illness. 7. Diabetes. 8. Resolved delirium. 9. Resolved acute respiratory failure.  CONSULTANTS:  Cardiology with [facility] (Dr. Hassell Done).  CURRENT LABORATORY DATA:  September 28, 2010, sodium 151, potassium 3.5, chloride 117, CO2 of 27, glucose 183, BUN 64, creatinine 4.21, calcium 7.1, magnesium 2.2, phosphorus 5.2, albumin 2.5, hemoglobin 8.3, hematocrit 25.7.  White blood cell count 8.4, platelet count 218.  MICROBIOLOGY DATA:  Blood cultures x2 on September 23, 2010, both negative. Bronchioalveolar lavage on September 11, 2010, demonstrated Citrobacter koseri, this was a greater than 100,000 colony count.  SPECIMENS:  Blood cultures September 05, 2010, demonstrated 1/2 coag- negative staph.  Blood cultures September 02, 2010, were both negative. Abdominal wound culture from I and D on September 04, 2010, demonstrated Escherichia coli.  Urine culture from September 05, 2010, was negative and urine culture on September 02, 2010, was negative.  PROCEDURES: 1. Endotracheal tube, this was placed on September 02, 2010, and     removed on September 17, 2010. 2. Left internal jugular vein triple-lumen catheter placed on September 02, 2010, and removed on September 20, 2010. 3. Right radial A-line, this was placed on September 02, 2010, and     removed on September 12, 2010. 4. Right internal  jugular vein hemodialysis catheter, this was placed     on September 06, 2010, and removed on September 27, 2010.  EVENT/DIAGNOSTICS:  Initially, Mr. Medine underwent general surgery on August 30, 2010, by Dr. Jamesetta So at [facility].  The procedure was laparoscopic herniorrhaphy with mesh.  CT scan on August 30, 2010, this was of the chest demonstrated free intraperitoneal air following abdominal surgery.  2-D echocardiogram on August 31, 2010, demonstrated an ejection fraction of 60% with mild left ventricular hypertrophy.  Followup CAT scan of abdomen and pelvis on September 03, 2010, demonstrated an 11 x 6.5 cm fluid collection at the right lower abdominal wall, also small-bowel obstruction was noted.  On September 05, 2010, Mr. Ellerbe had already been transferred to [facility] for further evaluation.  He had undergone open lysis of adhesions with small bowel resection times 60 cm, open closure with wound VAC placement.  He also had open drainage of interloop mesenteric abscess.  On September 14, 2010, he underwent a repeat CT of abdomen and pelvis, this demonstrated scattered focal intraperitoneal areas of mesenteric fluid, however, no convincing evidence of abscess.  BRIEF HISTORY:  This is a 60 year old male patient, who was transferred from [facility] on September 02, 2010, in the setting of atrial fibrillation with rapid ventricular response status post hernia repair. He developed progressive hypoxemia and an acute respiratory failure on this basis requiring endotracheal intubation and mechanical ventilation. He was noted to have fever, warmth, redness, and swelling of the right lower quadrant and eventually surgical site infection with an abdominal abscess/peritonitis was identified.  He underwent surgery as mentioned above, and was eventually transferred to the Pulmonary Critical Care Service for supportive care during his critical illness.  HOSPITAL  COURSE BY DISCHARGE DIAGNOSES: 1. Acute respiratory failure secondary to sepsis, further complicated     by volume overload, Citrobacter pneumonia, and delirium.  Mr. Debock     was intubated on September 02, 2010, his length on the mechanical     ventilator was prolonged secondary to Citrobacter pansensitive     pneumonia.  He was treated initially with cefepime for 7 days for     this, of note he had already been on Zosyn prior to addition of     cefepime for abdominal coverage.  Additionally, continued     respiratory distress, and prolonged ventilator dependence was     secondary to profound agitated delirium, and after this was     resolved volume overloaded in the setting of renal failure.     Eventually, Mr. Ticer's delirium improved, his volume status was     improved with hemodialysis and then eventually diuretic therapy,     and he was successfully extubated on September 17, 2010.  His     postextubation course has been complicated by episodes of volume     excess requiring at one-time noninvasive positive pressure     ventilation.  Since that time, his diuretic regimen has been     adjusted and he is now weaned down to 2 L nasal cannula with     improving chest x-ray on serial films.  Recommendation from     pulmonary standpoint would be to continue pulmonary hygiene     measures, and at this point, would keep an even fluid balance as     tolerated. 2. Atrial fibrillation with rapid ventricular response, and probable     element of heart failure.  At this point, Mr. Slemmer remains on rate     control with both calcium-channel blockade and beta-blockade.     [facility] is also following for further recommendations on     treatment of his atrial fibrillation. 3. Acute renal failure.  Mr. Aldridge did require hemodialysis.  He has     been off dialysis now for several days.  At this point, Nephrology     continues to follow.  We are working on keeping his fluid balance      even, he is making urine, his last serum creatinine was 4.21.  This     continues to trend down from 4.7 the day before dictation, and 4.43     the day before that.  Further recommendations will be made per     Nephrology Services. 4. Hypernatremia.  This is no doubt a complication of his prior renal     failure and for this, he is receiving free water supplementation. 5. Hypokalemia.  For this, he is receiving potassium replacement. 6. Status post exploratory laparoscopic repair of ventral hernia on     August 30, 2010.  This is complicated by abdominal abscess and     abdominal sepsis with cultures positive for Escherichia coli.  He     is now status post antibiotic therapy with no current evidence of     infection, or radiographic evidence of abscess.  He has a wound VAC     in place, and General Surgery is assisting in management of this. 7. Anemia of critical illness.  For this, we are following complete     blood count, he has tolerated subcutaneous heparin 5000 units q.8 h     without difficulty.  He has no current evidence of bleeding. 8. Diabetes.  He is a non-insulin dependent diabetic.  He was treated     previously on metformin.  At this point, he is getting low-dose     sliding scale insulin. 9. Debilitation following prolonged critical illness.  For this     Physical Therapy and Occupational Therapy have been consulted.  DISCHARGE MEDICATIONS:  These will be deferred until time of formal discharge.  DISPOSITION:  Mr. Mchan has made marked improvement over the course of his hospitalization; however, he continues to have several acute medical issues following his prolonged critical illness.  He is now medically cleared for transfer to the Internal Medicine Service.  He will be transferred to [facility], they will assume the primary management of his care as we ready him for his rehabilitation goals.     Salvadore Dom,  NP   ______________________________ Chesley Mires, MD    PB/MEDQ  D:  09/28/2010  T:  09/28/2010  Job:  [id number]  Electronically Signed by Salvadore Dom NP on 10/08/2010 10:10:27 PM Electronically Signed by Chesley Mires MD on 10/11/2010 12:18:50 PM

## 2010-10-18 ENCOUNTER — Telehealth: Payer: Self-pay | Admitting: Adult Health

## 2010-10-18 DIAGNOSIS — I10 Essential (primary) hypertension: Secondary | ICD-10-CM

## 2010-10-18 DIAGNOSIS — I4891 Unspecified atrial fibrillation: Secondary | ICD-10-CM

## 2010-10-18 DIAGNOSIS — N184 Chronic kidney disease, stage 4 (severe): Secondary | ICD-10-CM

## 2010-10-18 NOTE — Telephone Encounter (Signed)
Pt was seen at [facility] but never made a follow up appt is scheduled for 10/23/10 with [employer]. He has run out of all rx that were given at time of discharge including, clonadin, lisinopril, amarly, eiltiazem and metroplol. He uses the [facility] in [place].  Please call them either way and let them know if rx will be filled or not. thanks

## 2010-10-18 NOTE — Telephone Encounter (Signed)
**Note De-Identified  Obfuscation** RX for Clonadine 0.1mg  (1 tablet po bid #60 1 refill), Lisinopril 2.5mg  (1 tablet daily #30 1 refill), Amaryl 1mg  (take 1 tablet po daily #30 1 refill), Diltiazem 180mg  (1 tablet po bid #60 1 refill), and Metoprolol 100mg  (1 tablet po bid #60 1 refill). Pt. Is aware.

## 2010-10-21 DIAGNOSIS — I10 Essential (primary) hypertension: Secondary | ICD-10-CM | POA: Insufficient documentation

## 2010-10-21 DIAGNOSIS — I4891 Unspecified atrial fibrillation: Secondary | ICD-10-CM | POA: Insufficient documentation

## 2010-10-21 DIAGNOSIS — E119 Type 2 diabetes mellitus without complications: Secondary | ICD-10-CM | POA: Insufficient documentation

## 2010-10-21 DIAGNOSIS — N184 Chronic kidney disease, stage 4 (severe): Secondary | ICD-10-CM | POA: Insufficient documentation

## 2010-10-22 ENCOUNTER — Emergency Department (HOSPITAL_COMMUNITY): Payer: Medicaid Other

## 2010-10-22 ENCOUNTER — Inpatient Hospital Stay (HOSPITAL_COMMUNITY): Payer: Medicaid Other

## 2010-10-22 ENCOUNTER — Inpatient Hospital Stay (HOSPITAL_COMMUNITY)
Admission: EM | Admit: 2010-10-22 | Discharge: 2010-11-03 | DRG: 856 | Disposition: A | Discharge status: Home / Self Care | Payer: Medicaid Other | Source: Ambulatory Visit | Attending: General Surgery | Admitting: General Surgery

## 2010-10-22 DIAGNOSIS — K650 Generalized (acute) peritonitis: Secondary | ICD-10-CM | POA: Diagnosis present

## 2010-10-22 DIAGNOSIS — K929 Disease of digestive system, unspecified: Secondary | ICD-10-CM | POA: Diagnosis not present

## 2010-10-22 DIAGNOSIS — D649 Anemia, unspecified: Secondary | ICD-10-CM | POA: Diagnosis present

## 2010-10-22 DIAGNOSIS — J95821 Acute postprocedural respiratory failure: Secondary | ICD-10-CM | POA: Diagnosis not present

## 2010-10-22 DIAGNOSIS — I129 Hypertensive chronic kidney disease with stage 1 through stage 4 chronic kidney disease, or unspecified chronic kidney disease: Secondary | ICD-10-CM | POA: Diagnosis present

## 2010-10-22 DIAGNOSIS — Y838 Other surgical procedures as the cause of abnormal reaction of the patient, or of later complication, without mention of misadventure at the time of the procedure: Secondary | ICD-10-CM | POA: Diagnosis not present

## 2010-10-22 DIAGNOSIS — E875 Hyperkalemia: Secondary | ICD-10-CM | POA: Diagnosis present

## 2010-10-22 DIAGNOSIS — K651 Peritoneal abscess: Secondary | ICD-10-CM | POA: Diagnosis present

## 2010-10-22 DIAGNOSIS — K660 Peritoneal adhesions (postprocedural) (postinfection): Secondary | ICD-10-CM | POA: Diagnosis present

## 2010-10-22 DIAGNOSIS — E119 Type 2 diabetes mellitus without complications: Secondary | ICD-10-CM | POA: Diagnosis present

## 2010-10-22 DIAGNOSIS — E872 Acidosis, unspecified: Secondary | ICD-10-CM | POA: Diagnosis present

## 2010-10-22 DIAGNOSIS — Y832 Surgical operation with anastomosis, bypass or graft as the cause of abnormal reaction of the patient, or of later complication, without mention of misadventure at the time of the procedure: Secondary | ICD-10-CM | POA: Diagnosis present

## 2010-10-22 DIAGNOSIS — I4891 Unspecified atrial fibrillation: Secondary | ICD-10-CM | POA: Diagnosis present

## 2010-10-22 DIAGNOSIS — T8140XA Infection following a procedure, unspecified, initial encounter: Principal | ICD-10-CM | POA: Diagnosis present

## 2010-10-22 DIAGNOSIS — E871 Hypo-osmolality and hyponatremia: Secondary | ICD-10-CM | POA: Diagnosis present

## 2010-10-22 DIAGNOSIS — K560 Paralytic ileus: Secondary | ICD-10-CM | POA: Diagnosis not present

## 2010-10-22 DIAGNOSIS — N179 Acute kidney failure, unspecified: Secondary | ICD-10-CM | POA: Diagnosis present

## 2010-10-22 DIAGNOSIS — N183 Chronic kidney disease, stage 3 unspecified: Secondary | ICD-10-CM | POA: Diagnosis present

## 2010-10-22 LAB — LIPASE, BLOOD: Lipase: 26 U/L (ref 11–59)

## 2010-10-22 LAB — URINALYSIS, ROUTINE W REFLEX MICROSCOPIC
Bilirubin Urine: NEGATIVE
Ketones, ur: NEGATIVE mg/dL
Nitrite: NEGATIVE
Specific Gravity, Urine: 1.02 (ref 1.005–1.030)
Urobilinogen, UA: 0.2 mg/dL (ref 0.0–1.0)

## 2010-10-22 LAB — BLOOD GAS, ARTERIAL
Acid-base deficit: 8.3 mmol/L — ABNORMAL HIGH (ref 0.0–2.0)
Bicarbonate: 15.6 mEq/L — ABNORMAL LOW (ref 20.0–24.0)
FIO2: 0.4 %
O2 Saturation: 99.2 %
PEEP: 5 cmH2O
Patient temperature: 98.6
RATE: 15 resp/min

## 2010-10-22 LAB — BASIC METABOLIC PANEL
BUN: 38 mg/dL — ABNORMAL HIGH (ref 6–23)
CO2: 20 mEq/L (ref 19–32)
CO2: 18 mEq/L — ABNORMAL LOW (ref 19–32)
Calcium: 7.1 mg/dL — ABNORMAL LOW (ref 8.4–10.5)
Chloride: 101 mEq/L (ref 96–112)
Chloride: 107 mEq/L (ref 96–112)
Creatinine, Ser: 2.51 mg/dL — ABNORMAL HIGH (ref 0.4–1.5)
GFR calc Af Amer: 30 mL/min — ABNORMAL LOW (ref >60)
GFR calc Af Amer: 32 mL/min — ABNORMAL LOW (ref >60)
GFR calc non Af Amer: 26 mL/min — ABNORMAL LOW (ref >60)
Glucose, Bld: 170 mg/dL — ABNORMAL HIGH (ref 70–99)
Potassium: 4.7 mEq/L (ref 3.5–5.1)
Sodium: 131 mEq/L — ABNORMAL LOW (ref 135–145)
Sodium: 133 mEq/L — ABNORMAL LOW (ref 135–145)

## 2010-10-22 LAB — COMPREHENSIVE METABOLIC PANEL
BUN: 45 mg/dL — ABNORMAL HIGH (ref 6–23)
CO2: 19 mEq/L (ref 19–32)
Chloride: 98 mEq/L (ref 96–112)
Creatinine, Ser: 2.82 mg/dL — ABNORMAL HIGH (ref 0.4–1.5)
GFR calc non Af Amer: 23 mL/min — ABNORMAL LOW (ref >60)
Total Bilirubin: 2.1 mg/dL — ABNORMAL HIGH (ref 0.3–1.2)

## 2010-10-22 LAB — CBC
HCT: 35.6 % — ABNORMAL LOW (ref 39.0–52.0)
Hemoglobin: 10.2 g/dL — ABNORMAL LOW (ref 13.0–17.0)
Hemoglobin: 11.9 g/dL — ABNORMAL LOW (ref 13.0–17.0)
MCH: 31 pg (ref 26.0–34.0)
MCH: 30.5 pg (ref 26.0–34.0)
MCHC: 33.4 g/dL (ref 30.0–36.0)
MCV: 90 fL (ref 78.0–100.0)
MCV: 91.3 fL (ref 78.0–100.0)
Platelets: 233 10*3/uL (ref 150–400)
RBC: 3.29 MIL/uL — ABNORMAL LOW (ref 4.22–5.81)
RBC: 3.9 MIL/uL — ABNORMAL LOW (ref 4.22–5.81)
RDW: 13.3 % (ref 11.5–15.5)
WBC: 19.3 10*3/uL — ABNORMAL HIGH (ref 4.0–10.5)

## 2010-10-22 LAB — DIFFERENTIAL
Eosinophils Absolute: 0 10*3/uL (ref 0.0–0.7)
Lymphs Abs: 3.7 10*3/uL (ref 0.7–4.0)
Monocytes Relative: 5 % (ref 3–12)
Neutrophils Relative %: 76 % (ref 43–77)

## 2010-10-22 LAB — GLUCOSE, CAPILLARY

## 2010-10-22 NOTE — Consult Note (Addendum)
NAMECARMYNE, [name] NO.:  [id number]  MEDICAL RECORD NO.:  [id number]           PATIENT TYPE:  E  LOCATION:  [facility]ED                         FACILITY:  [facility]  PHYSICIAN:  Lottie Dawson, MD       DATE OF BIRTH:  09-20-1950  DATE OF CONSULTATION:  10/22/2010 DATE OF DISCHARGE:                                CONSULTATION   PRIMARY CARE PROVIDER:  Health Department in [place].  Consult is being assigned to [facility].  REFERING PHYSICIAN: ED physician.  CHIEF COMPLAINT:  Abdominal pain/nausea/vomiting.  HISTORY OF PRESENT ILLNESS:  Mr. Hergenrader is a very pleasant 60 year old male with a history of hypertension, diabetes and recent small bowel resection and debridement of abdominal wall who presents to the [facility] ED with a chief complaint of abdominal pain, nausea, vomiting.  Information is obtained from the patient and the family who is at the bedside and who also serve as a Optometrist as this patient speaks very little [place].  The patient was discharged on October 03, 2010 after a 4-week hospitalization for a small-bowel resection.  He states that he was doing well until 3 days ago when he developed abdominal pain, nausea, and vomiting.  He describes the pain as sharp and constant and mostly in his right upper quadrant.  He denies any radiation of the pain.  He states that the pain is worse with movement.  He indicates that he was unable to eat or drink.  Associated symptoms include chills and subjective fever.  He also indicates that he has had intermittent vomiting.  He denies any bloody emesis or coffee ground emesis.  He states that initially he was constipated, but yesterday had one episode of a little bit of diarrhea.  He denies headache, but does state that he is dizzy with position change.  He denies any chest pain, palpitation, cough, lower extremity edema.  He states that the symptoms came on suddenly 3 days ago, have persisted  and worsened and characterizes them as moderate.  Workup in the ED yields a CT with multiple right upper quadrant abscess.  Workup also includes a white count of 20 and a creatinine of 2.8.  We are asked to consult to help manage medical issues.  ALLERGIES:  No known drug allergies.  PAST SURGICAL HISTORY: 1. Hernia repair on August 30, 2010. 2. Colonoscopy with biopsy in December 2011.  FAMILY MEDICAL HISTORY:  Mother is deceased at 31 of old age.  Father is still alive in his 35s.  No known medical problems.  He has siblings that are also healthy.  SOCIAL HISTORY:  Denies tobacco use.  Denies EtOH use.  Married and lives with his wife.  He is a retired Engineer, manufacturing systems.  MEDICATIONS:  To be reconciled by pharmacy.  REVIEW OF SYSTEMS:  GENERAL:  Positive chills, subjective fever, anorexia, negative unintentional weight loss.  ENT:  Negative ear pain, nasal congestion, sore throat.  CV:  Negative chest pain, palpitation, lower extremity edema.  RESPIRATORY:  Negative cough, increased work of breathing.  MUSCULOSKELETAL:  Negative for joint pain, muscle weakness. NEURO:  Positive dizziness, negative visual disturbances, numbness, tingling of extremities, photophobia.  GI:  See HPI.  GU:  Negative dysuria, hematuria, frequency or urgency.  PSYCH:  Negative depression, anxiety.  HEME:  Negative for any unusual bruising or bleeding.  LABORATORY DATA:  WBCs 20.0, hemoglobin 10.2, hematocrit 29.6, platelets 218, absolute neutrophils 15.2.  Sodium 129, potassium 6.0, chloride 99, CO2 of 19, BUN 45, creatinine 2.8, glucose 140, total bili is 2.1, AST is 40.  Albumin 2.7, calcium 8.3.  Lipase is 26.  Urinalysis with small leukocytes, cloudy in appearance, rare bacteria, 3-6 wbc's, hyaline casts.  RADIOLOGY:  CT of the abdomen and pelvis yields multiple right upper quadrant abscesses some containing gas, an area of free intraperitoneal gas.  PHYSICAL EXAMINATION:  VITAL SIGNS:   Temperature 97.0, blood pressure 118/68, heart rate 88, respirations 18, sats 98% on room air. GENERAL:  Awake, alert, well-nourished, well-hydrated, in no acute distress. HEENT:  Head is normocephalic, atraumatic.  Pupils are equal, round, reactive to light.  EOMI. Mucous membranes of mouth moist and pink.  No obvious lesion or exudate in nose or ears. NECK:  Supple.  No JVD.  Full range of motion.  No lymphadenopathy. CV:  Regular rate and rhythm.  No murmur, gallop, or rub.  No lower extremity edema. RESPIRATORY:  No increased work of breathing.  Breath sounds are clear to auscultation bilaterally.  No rhonchi, wheezes, or rales. ABDOMEN:  Round, soft, positive bowel sounds throughout.  Right lower quadrant incision well healed, slightly tender to palpation in the right upper and lower quadrants.  No mass or organomegaly. NEUROLOGIC:  Alert and oriented x3, follows commands.  Cranial nerves II through XII grossly intact. MUSCULOSKELETAL:  Moves all extremities.  No joint swelling/erythema. Full range of motion. EXTREMITIES:  Without clubbing or cyanosis.  ASSESSMENT AND PLAN: 1. Acute renal failure in the setting of chronic kidney disease stage     III.  The patient received contrast.  Creatinine is currently 2.83.     Renal consult requested by ED physician. Trend creatinine. 2. Abdominal abscess per CT, Surgery to admit and manage.  Starting     carbapenem by surgery. 3. Leukocytosis. White count 20.0.  Taking the patient to surgery this     afternoon and admitting to ICU after that. Antibiotics started     as above. 3. Hyperkalemia, potassium 6.0.  Received insulin, Kayexalate in the     ED.  No EKG changes.  The patient going to ICU after surgery.  We     will recheck his potassium in 4 hours. 4. Diabetes.  The patient reports his blood sugar has been 174-168     over the last 4 days.  We will use sliding scale glycemic control. 5. Anemia secondary to critical illness.  We  will monitor. 6. Hyponatremia secondary to acute renal failure.  The patient's     sodium is 131.  We will gently hydrate and monitor closely. 7. History of respiratory failure on the last hospitalization.  We     will watch his fluid/volume status.  The patient     going to ICU postop. 8. Once the patient's surgery issues resolve, we will consider     transferring to medical service.  This assessment and plan was discussed with Dr. Lottie Dawson.  Thank very much for the consult.   Radene Gunning, NP ______________________________ Lottie Dawson, MD   KMB/MEDQ  D:  10/22/2010  T:  10/22/2010  Job:  [id number]  Electronically Signed by Lottie Dawson  on 10/22/2010 09:53:45 PM Electronically Signed by Dyanne Carrel  on 10/29/2010 09:24:21 AM

## 2010-10-23 ENCOUNTER — Encounter: Payer: Self-pay | Admitting: Adult Health

## 2010-10-23 ENCOUNTER — Inpatient Hospital Stay (HOSPITAL_COMMUNITY): Payer: Medicaid Other

## 2010-10-23 DIAGNOSIS — K630 Abscess of intestine: Secondary | ICD-10-CM

## 2010-10-23 DIAGNOSIS — N179 Acute kidney failure, unspecified: Secondary | ICD-10-CM

## 2010-10-23 DIAGNOSIS — J9600 Acute respiratory failure, unspecified whether with hypoxia or hypercapnia: Secondary | ICD-10-CM

## 2010-10-23 LAB — BASIC METABOLIC PANEL
BUN: 39 mg/dL — ABNORMAL HIGH (ref 6–23)
CO2: 17 mEq/L — ABNORMAL LOW (ref 19–32)
Calcium: 6.9 mg/dL — ABNORMAL LOW (ref 8.4–10.5)
Chloride: 106 mEq/L (ref 96–112)
Creatinine, Ser: 2.78 mg/dL — ABNORMAL HIGH (ref 0.4–1.5)
GFR calc Af Amer: 28 mL/min — ABNORMAL LOW (ref >60)
GFR calc non Af Amer: 23 mL/min — ABNORMAL LOW (ref >60)
Glucose, Bld: 165 mg/dL — ABNORMAL HIGH (ref 70–99)
Potassium: 4.5 mEq/L (ref 3.5–5.1)
Sodium: 134 mEq/L — ABNORMAL LOW (ref 135–145)

## 2010-10-23 LAB — POCT I-STAT 3, ART BLOOD GAS (G3+)
Acid-base deficit: 8 mmol/L — ABNORMAL HIGH (ref 0.0–2.0)
O2 Saturation: 99 %
Patient temperature: 99.3
TCO2: 16 mmol/L (ref 0–100)
pH, Arterial: 7.384 (ref 7.350–7.450)

## 2010-10-23 LAB — CBC
HCT: 29.1 % — ABNORMAL LOW (ref 39.0–52.0)
Hemoglobin: 9.8 g/dL — ABNORMAL LOW (ref 13.0–17.0)
MCH: 30.7 pg (ref 26.0–34.0)
MCHC: 33.7 g/dL (ref 30.0–36.0)
MCV: 91.2 fL (ref 78.0–100.0)
Platelets: 228 10*3/uL (ref 150–400)
RBC: 3.19 MIL/uL — ABNORMAL LOW (ref 4.22–5.81)
RDW: 13.5 % (ref 11.5–15.5)
WBC: 17.3 10*3/uL — ABNORMAL HIGH (ref 4.0–10.5)

## 2010-10-23 LAB — GLUCOSE, CAPILLARY
Glucose-Capillary: 175 mg/dL — ABNORMAL HIGH (ref 70–99)
Glucose-Capillary: 150 mg/dL — ABNORMAL HIGH (ref 70–99)
Glucose-Capillary: 157 mg/dL — ABNORMAL HIGH (ref 70–99)

## 2010-10-23 NOTE — Op Note (Signed)
NAMEDONTREAL, TAUTE              ACCOUNT NO.:  [id number]  MEDICAL RECORD NO.:  [id number]           PATIENT TYPE:  I  LOCATION:  2304                         FACILITY:  [place]  PHYSICIAN:  Dickey Gave, MDDATE OF BIRTH:  07-24-1950  DATE OF PROCEDURE:  10/22/2010 DATE OF DISCHARGE:                              OPERATIVE REPORT   PREOPERATIVE DIAGNOSIS:  Free air and abdominal fluid collection status post enterotomy after a laparotomy ventral hernia with multiple subsequent operations.  POSTOPERATIVE DIAGNOSIS:  Perforated small bowel anastomosis with loculated abscess and some loculated free air.  PROCEDURE: 1. Exploratory laparotomy. 2. Lysis of adhesions x1 hour. 3. Placement of Blake drain. 4. Abdominal vacuum-assisted closure placement.  SURGEON:  Dickey Gave, MD.  ASSISTANT:  Leighton Ruff. Redmond Pulling, MD  ANESTHESIA:  General.  SUPERVISING ANESTHESIOLOGIST:  Sharolyn Douglas, M.D.  SPECIMENS:  None.  DRAINS:  19-French Keenan Bachelor drain to the area where it appeared that the anastomosis was leaking and VAC placement to open abdomen.  ESTIMATED BLOOD LOSS:  50 mL.  COMPLICATIONS:  None.  DISPOSITION:  To ICU intubated.  HISTORY:  This is a 60 year old male who underwent a lap ventral hernia at [facility] in the past that was complicated by atrial fibrillation with RVR and was intubated.  He was then transferred to [facility].  Eventually, he was noted to have a tender hernia that appeared to be red.  After this, he was re-explored by Dr. Neysa Bonito and found to have a pin hole perforation of the small bowel, and this was very adherent to the mesh and underwent resection of 60 cm of small bowel and had a very long postoperative course that was complicated by renal failure, sepsis, and two operations before he ended up getting closed.  He went home about 2 weeks ago and has been doing well until Friday when he developed chills, fevers, and had some  right upper quadrant pain.  He came to the emergency room today and he was noted to have tenderness on his abdominal exam.  White blood cell count of 20 as well as the CT scan is concerning for an anastomotic leak.  He also had multiple electrolyte abnormalities.  I have discussed with he and his family the options.  This area is not really amenable to any percutaneous treatment, so we discussed a laparotomy with all of the different options that might be involved with this procedure.   Procedure: After informed consent was obtained, the patient was first stabilized and his potassium was brought down to 4.5 before beginning.  He was then brought to the operating room.  He had underwent administration of Invanz.  He had sequential compression devices placed on lower extremities prior to induction of anesthesia.  He was then placed under general endotracheal anesthesia without complication.  A Foley catheter was placed.  He was then prepped and draped in the standard sterile surgical fashion. Surgical time-out was then performed.  I made an upper midline incision, carried this through the fascia into the peritoneum without any complication.  I then took down some adhesions from his anterior  abdominal wall and gained access to his abdomen.  He had a fair amount of adhesions.  The area appeared to be in his right upper quadrant and I then opened the area in his right upper quadrant and dissected this. There was a very dilated anastomosis that was associated with this. With blunt dissection as well as a combination of Metzenbaum scissors, eventually I was able to take this down and identify multiple pockets of fluid as well as eventually I was able to get into a pocket of purulent fluid that appeared to emanate from the apex of his small bowel side-to- side anastomosis, where it appeared to have leaked.  I could not get it to leak any more and it appeared to have sealed at this point.  It  was not actively leaking, but I am pretty sure that this is the source.  I then freed up the remaining areas.  I eventually was able to run his bowel from his terminal ileum all the way to his ligament of Treitz just proximal to the anastomosis which is in the distal third of the small bowel.  It was dilated all the way through to his terminal ileum.  The remainder of his bowel was not dilated and appeared healthy and I think that he may have perforated and caused a focal ileus just in this region as I cannot really identify a portion where there was a transition point or where there was a clear stricture that is the cause of this.  Due to the fact that I cannot tell where this is, I did elect to place a VAC on him.  There is not really a need to place a jejunostomy tube given the fact that the hole is in the distal third of his bowel as well.  I think that the best thing to do would be to come back in a couple of days to see if we can identify this hole, to see if he gets better and then decide what to do in the next 48 hours depending on how he does clinically.  A Blake drain was placed where I think this was leaking. This was secured.  I then placed an abdominal VAC.  I tolerated this fairly well and is going to be transferred to the ICU, intubated.  We will obtain a critical care medicine consult as well.  We plan to return to the operating room in about 48 hours.     Dickey Gave, MD     MCW/MEDQ  D:  10/22/2010  T:  10/23/2010  Job:  [id number]  Electronically Signed by Rolm Bookbinder MD on 10/23/2010 04:01:52 PM

## 2010-10-24 ENCOUNTER — Inpatient Hospital Stay (HOSPITAL_COMMUNITY): Payer: Medicaid Other

## 2010-10-24 DIAGNOSIS — J95821 Acute postprocedural respiratory failure: Secondary | ICD-10-CM

## 2010-10-24 LAB — GLUCOSE, CAPILLARY
Glucose-Capillary: 130 mg/dL — ABNORMAL HIGH (ref 70–99)
Glucose-Capillary: 169 mg/dL — ABNORMAL HIGH (ref 70–99)

## 2010-10-24 LAB — COMPREHENSIVE METABOLIC PANEL
BUN: 40 mg/dL — ABNORMAL HIGH (ref 6–23)
CO2: 17 mEq/L — ABNORMAL LOW (ref 19–32)
Calcium: 7 mg/dL — ABNORMAL LOW (ref 8.4–10.5)
Creatinine, Ser: 2.98 mg/dL — ABNORMAL HIGH (ref 0.4–1.5)
GFR calc Af Amer: 26 mL/min — ABNORMAL LOW (ref >60)
GFR calc non Af Amer: 21 mL/min — ABNORMAL LOW (ref >60)
Glucose, Bld: 141 mg/dL — ABNORMAL HIGH (ref 70–99)
Total Bilirubin: 0.1 mg/dL — ABNORMAL LOW (ref 0.3–1.2)

## 2010-10-24 LAB — MAGNESIUM: Magnesium: 2 mg/dL (ref 1.5–2.5)

## 2010-10-24 LAB — DIFFERENTIAL
Basophils Relative: 0 % (ref 0–1)
Lymphs Abs: 3.2 10*3/uL (ref 0.7–4.0)
Monocytes Absolute: 1 10*3/uL (ref 0.1–1.0)
Monocytes Relative: 7 % (ref 3–12)
Neutro Abs: 10.1 10*3/uL — ABNORMAL HIGH (ref 1.7–7.7)

## 2010-10-24 LAB — APTT: aPTT: 31 seconds (ref 24–37)

## 2010-10-24 LAB — TRIGLYCERIDES: Triglycerides: 216 mg/dL — ABNORMAL HIGH (ref <150)

## 2010-10-24 LAB — CBC
HCT: 23.8 % — ABNORMAL LOW (ref 39.0–52.0)
Hemoglobin: 7.9 g/dL — ABNORMAL LOW (ref 13.0–17.0)
MCH: 30.4 pg (ref 26.0–34.0)
MCHC: 33.2 g/dL (ref 30.0–36.0)
MCV: 91.5 fL (ref 78.0–100.0)

## 2010-10-24 LAB — CHOLESTEROL, TOTAL: Cholesterol: 103 mg/dL (ref 0–200)

## 2010-10-24 LAB — PREALBUMIN: Prealbumin: 8 mg/dL — ABNORMAL LOW (ref 17.0–34.0)

## 2010-10-24 LAB — PHOSPHORUS: Phosphorus: 5.9 mg/dL — ABNORMAL HIGH (ref 2.3–4.6)

## 2010-10-25 LAB — COMPREHENSIVE METABOLIC PANEL
ALT: <8 U/L (ref 0–53)
AST: 9 U/L (ref 0–37)
Albumin: 1.7 g/dL — ABNORMAL LOW (ref 3.5–5.2)
Alkaline Phosphatase: 69 U/L (ref 39–117)
BUN: 30 mg/dL — ABNORMAL HIGH (ref 6–23)
Chloride: 110 mEq/L (ref 96–112)
Potassium: 3.5 mEq/L (ref 3.5–5.1)
Sodium: 139 mEq/L (ref 135–145)
Total Bilirubin: 0.4 mg/dL (ref 0.3–1.2)
Total Protein: 5.4 g/dL — ABNORMAL LOW (ref 6.0–8.3)

## 2010-10-25 LAB — CBC
MCV: 92.3 fL (ref 78.0–100.0)
Platelets: 239 10*3/uL (ref 150–400)
RBC: 2.74 MIL/uL — ABNORMAL LOW (ref 4.22–5.81)
RDW: 13.5 % (ref 11.5–15.5)
WBC: 13.7 10*3/uL — ABNORMAL HIGH (ref 4.0–10.5)

## 2010-10-25 LAB — PHOSPHORUS: Phosphorus: 4 mg/dL (ref 2.3–4.6)

## 2010-10-25 LAB — GLUCOSE, CAPILLARY
Glucose-Capillary: 152 mg/dL — ABNORMAL HIGH (ref 70–99)
Glucose-Capillary: 185 mg/dL — ABNORMAL HIGH (ref 70–99)
Glucose-Capillary: 198 mg/dL — ABNORMAL HIGH (ref 70–99)

## 2010-10-26 LAB — CBC
HCT: 25.1 % — ABNORMAL LOW (ref 39.0–52.0)
Hemoglobin: 8.3 g/dL — ABNORMAL LOW (ref 13.0–17.0)
MCV: 91.6 fL (ref 78.0–100.0)
RBC: 2.74 MIL/uL — ABNORMAL LOW (ref 4.22–5.81)
WBC: 13.6 10*3/uL — ABNORMAL HIGH (ref 4.0–10.5)

## 2010-10-26 LAB — BASIC METABOLIC PANEL
BUN: 23 mg/dL (ref 6–23)
CO2: 23 mEq/L (ref 19–32)
Chloride: 113 mEq/L — ABNORMAL HIGH (ref 96–112)
Glucose, Bld: 174 mg/dL — ABNORMAL HIGH (ref 70–99)
Potassium: 3.7 mEq/L (ref 3.5–5.1)

## 2010-10-26 NOTE — Consult Note (Signed)
  [name], [name]              ACCOUNT NO.:  [id number]  MEDICAL RECORD NO.:  [id number]           PATIENT TYPE:  I  LOCATION:  2304                         FACILITY:  [place]  PHYSICIAN:  Darrold Span. Florene Glen, M.D.  DATE OF BIRTH:  22-Dec-1950  DATE OF CONSULTATION: DATE OF DISCHARGE:                                CONSULTATION   I was asked by Dr. Kae Heller to this 60 year old male admitted on October 22, 2010, to the emergency room with abdominal pain.  CT with contrast showed multiple fluid collections in the abdomen with the concern of anastomotic leak.  Serum creatinine was 2.82 mg/dL.  The patient is status post laparoscopic repair of ventral hernia back in February 2012, and at that time, he had a small bowel perforation and repair.  Serum creatinine on September 02, 2010, was 0.69 mg/dL.  The patient developed acute SIRS, then ventilatory-dependent respiratory failure, and acute renal failure during the prior hospitalization.  Serum creatinine peaked at 6.72 mg/dL on September 17, 2010.  The patient received hemodialysis. The patient apparently was discharged and was seeing Dr. Hassell Done since discharge.  According to the daughter, serum creatinine was approximately 2.8 mg/dL.  On discharge, on October 07, 2010, serum creatinine was 3.2 mg/dL.  On October 22, 2010, yesterday, the patient underwent laparotomy, lysis of adhesions and drain placement as well as wound VAC.  We are asked to see because the patient did receive contrast on admission and the potassium was elevated and he had evidence of metabolic acidosis.  PAST HISTORY:  Remarkable for diabetes, hypertension, paroxysmal atrial fibrillation, status post SIRS, status post ventilatory-dependent respiratory status, status post appendectomy and status post hernia repair.  CURRENT MEDICATIONS:  Fluconazole, insulin, Protonix, Zosyn, and vancomycin.  Social history, family history, and review of systems are unobtainable at the current  time.  PHYSICAL EXAMINATION:  VITAL SIGNS:  Blood pressure is 87/64 to 104/61, FIO2 is 0.4, I's and O's from last 24 hours are 1226 in, 615 out. GENERAL:  A pleasant male, awake, and apparently does not understanding English, orally intubated. LUNGS:  Clear. HEART:  Regular rhythm and rate. ABDOMEN:  Postop. EXTREMITIES:  Without edema.  LABORATORY DATA:  Sodium 134, potassium 4.5, chloride 106, bicarb 17, BUN 39, creatinine 2.78, magnesium 1.4, phosphorus 6.0, PO2 of 122, PCO2 of 25, PA 7.38.  ASSESSMENT: 1. Acute renal failure in February 2012, improved but not at baseline     normal renal function.     a.     Status post contrast exposure on October 22, 2010.     b.     Hemodynamically-mediated insult - ongoing. 2. Metabolic acidosis.  PLAN: 1. Supportive therapy for now. 2. No dialytic needs at the current time. 3. Bicarbonate p.r.n. 4. I spoke with daughter at length.          ______________________________ Darrold Span. Florene Glen, M.D.     ACP/MEDQ  D:  10/23/2010  T:  10/24/2010  Job:  [id number]  Electronically Signed by Erling Cruz M.D. on 10/26/2010 03:10:45 PM

## 2010-10-27 LAB — GLUCOSE, CAPILLARY
Glucose-Capillary: 168 mg/dL — ABNORMAL HIGH (ref 70–99)
Glucose-Capillary: 179 mg/dL — ABNORMAL HIGH (ref 70–99)
Glucose-Capillary: 180 mg/dL — ABNORMAL HIGH (ref 70–99)

## 2010-10-27 LAB — BASIC METABOLIC PANEL
CO2: 25 mEq/L (ref 19–32)
GFR calc Af Amer: >60 mL/min (ref >60)
Glucose, Bld: 169 mg/dL — ABNORMAL HIGH (ref 70–99)
Potassium: 3.5 mEq/L (ref 3.5–5.1)
Sodium: 144 mEq/L (ref 135–145)

## 2010-10-27 LAB — PHOSPHORUS: Phosphorus: 4.2 mg/dL (ref 2.3–4.6)

## 2010-10-27 NOTE — Op Note (Signed)
NAMESADE, LACHAT              ACCOUNT NO.:  [id number]  MEDICAL RECORD NO.:  [id number]           PATIENT TYPE:  LOCATION:                                 FACILITY:  PHYSICIAN:  Dickey Gave, MDDATE OF BIRTH:  [birth year]  DATE OF PROCEDURE:  10/24/2010 DATE OF DISCHARGE:                              OPERATIVE REPORT   PREOPERATIVE DIAGNOSIS:  Open abdomen status post likely anastomotic leak with intra-abdominal abscess.  POSTOPERATIVE DIAGNOSIS:  Open abdomen status post likely anastomotic leak with intra-abdominal abscess.  PROCEDURE: 1. Exploratory laparotomy. 2. JP drain placed. 3. Abdominal wall closure. 4. Skin VAC placement.  SURGEON:  Mila Homer. Donne Hazel, MD  ASSISTANT:  Ascencion Dike, PA-C  ANESTHESIA:  General.  SUPERVISING ANESTHESIOLOGIST:  Midge Minium, MD  SPECIMENS:  None.  SPECIMEN:  None.  ESTIMATED BLOOD LOSS:  Minimal.  COMPLICATIONS:  None.  DISPOSITION:  To ICU in stable condition.  INDICATIONS:  This is a 60 year old male whose history is well documented in prior operative note who I took to the operating room 2 days ago for an abdominal exploration for a likely abscess.  He was found to have a large cavity centrally of purulence, this appeared to be coming from his small bowel anastomosis.  I evacuated the abscess, drained this, and left a VAC on it for 48 hours and I will take him back for a second look to see if I can further delineate where the issue was.  PROCEDURE:  After informed consent was obtained for the patient's family, he was taken to the operating room.  He had been administered antibiotics in the ICU.  He had sequential compression devices on his lower extremities already.  He was already on the ventilator.  He was then placed under general anesthesia.  His VAC was removed.  His abdomen was prepped and draped in the standard sterile surgical fashion.  A surgical time-out was then performed.  I would  then reexplored his abdomen.  Again, I could not find a distal obstruction or any evidence of a distal obstruction.  Some of his bowel was a little bit dilated, but I could not find an area of distal obstruction present.  Again, I was unable to really identify a leak.  It did appear that he did have a leak that sealed in his anastomosis.  I reevaluated the entire area.  There was no succus or intestinal contents present and no further evidence of infection.  We irrigated copiously. I did place a drain in his right gutter and left the one near his anastomosis.  I did an omental patch and put this overlying his entire common enterotomy from his anastomosis where it appeared this had leaked.  I then closed his abdomen with #1 loop PDS with interrupted #1 Novafil as well.  I placed a VAC overlying his skin.  He tolerated this well and will be transferred back to the ICU.     Dickey Gave, MD     MCW/MEDQ  D:  10/24/2010  T:  10/24/2010  Job:  [id number]  cc:   Adin Hector, MD Elta Guadeloupe  Lowella Petties, M.D.  Electronically Signed by Rolm Bookbinder MD on 10/27/2010 09:35:32 AM

## 2010-10-28 LAB — GLUCOSE, CAPILLARY
Glucose-Capillary: 161 mg/dL — ABNORMAL HIGH (ref 70–99)
Glucose-Capillary: 162 mg/dL — ABNORMAL HIGH (ref 70–99)
Glucose-Capillary: 162 mg/dL — ABNORMAL HIGH (ref 70–99)
Glucose-Capillary: 172 mg/dL — ABNORMAL HIGH (ref 70–99)
Glucose-Capillary: 137 mg/dL — ABNORMAL HIGH (ref 70–99)

## 2010-10-28 LAB — BASIC METABOLIC PANEL
Calcium: 8.1 mg/dL — ABNORMAL LOW (ref 8.4–10.5)
GFR calc non Af Amer: 53 mL/min — ABNORMAL LOW (ref >60)
Glucose, Bld: 189 mg/dL — ABNORMAL HIGH (ref 70–99)
Sodium: 144 mEq/L (ref 135–145)

## 2010-10-29 LAB — GLUCOSE, CAPILLARY
Glucose-Capillary: 201 mg/dL — ABNORMAL HIGH (ref 70–99)
Glucose-Capillary: 178 mg/dL — ABNORMAL HIGH (ref 70–99)
Glucose-Capillary: 146 mg/dL — ABNORMAL HIGH (ref 70–99)
Glucose-Capillary: 99 mg/dL (ref 70–99)
Glucose-Capillary: 149 mg/dL — ABNORMAL HIGH (ref 70–99)

## 2010-10-29 LAB — COMPREHENSIVE METABOLIC PANEL
ALT: 26 U/L (ref 0–53)
AST: 34 U/L (ref 0–37)
Calcium: 8.2 mg/dL — ABNORMAL LOW (ref 8.4–10.5)
GFR calc Af Amer: 57 mL/min — ABNORMAL LOW (ref >60)
Sodium: 147 mEq/L — ABNORMAL HIGH (ref 135–145)
Total Protein: 6.4 g/dL (ref 6.0–8.3)

## 2010-10-29 LAB — CBC
Hemoglobin: 7.9 g/dL — ABNORMAL LOW (ref 13.0–17.0)
MCHC: 32.6 g/dL (ref 30.0–36.0)
Platelets: 403 10*3/uL — ABNORMAL HIGH (ref 150–400)
RDW: 13.6 % (ref 11.5–15.5)

## 2010-10-29 LAB — CULTURE, BLOOD (ROUTINE X 2): Culture: NO GROWTH

## 2010-10-29 LAB — DIFFERENTIAL
Basophils Absolute: 0.1 10*3/uL (ref 0.0–0.1)
Eosinophils Absolute: 0.6 10*3/uL (ref 0.0–0.7)
Lymphs Abs: 3 10*3/uL (ref 0.7–4.0)
Monocytes Absolute: 0.6 10*3/uL (ref 0.1–1.0)
Monocytes Relative: 6 % (ref 3–12)

## 2010-10-29 LAB — PREALBUMIN: Prealbumin: 14 mg/dL — ABNORMAL LOW (ref 17.0–34.0)

## 2010-10-30 ENCOUNTER — Inpatient Hospital Stay (HOSPITAL_COMMUNITY): Payer: Medicaid Other

## 2010-10-30 ENCOUNTER — Encounter (HOSPITAL_COMMUNITY): Payer: Self-pay | Admitting: Radiology

## 2010-10-30 LAB — CBC
Hemoglobin: 7.8 g/dL — ABNORMAL LOW (ref 13.0–17.0)
MCH: 30 pg (ref 26.0–34.0)
MCHC: 32.2 g/dL (ref 30.0–36.0)
MCV: 93.1 fL (ref 78.0–100.0)

## 2010-10-30 LAB — GLUCOSE, CAPILLARY
Glucose-Capillary: 122 mg/dL — ABNORMAL HIGH (ref 70–99)
Glucose-Capillary: 125 mg/dL — ABNORMAL HIGH (ref 70–99)
Glucose-Capillary: 115 mg/dL — ABNORMAL HIGH (ref 70–99)

## 2010-10-30 LAB — BASIC METABOLIC PANEL
BUN: 39 mg/dL — ABNORMAL HIGH (ref 6–23)
CO2: 26 mEq/L (ref 19–32)
Calcium: 8.1 mg/dL — ABNORMAL LOW (ref 8.4–10.5)
Chloride: 114 mEq/L — ABNORMAL HIGH (ref 96–112)
Creatinine, Ser: 1.68 mg/dL — ABNORMAL HIGH (ref 0.4–1.5)
GFR calc Af Amer: 50 mL/min — ABNORMAL LOW (ref >60)
Glucose, Bld: 138 mg/dL — ABNORMAL HIGH (ref 70–99)

## 2010-10-31 LAB — GLUCOSE, CAPILLARY
Glucose-Capillary: 113 mg/dL — ABNORMAL HIGH (ref 70–99)
Glucose-Capillary: 143 mg/dL — ABNORMAL HIGH (ref 70–99)
Glucose-Capillary: 194 mg/dL — ABNORMAL HIGH (ref 70–99)

## 2010-10-31 LAB — BASIC METABOLIC PANEL
Chloride: 111 mEq/L (ref 96–112)
GFR calc Af Amer: 53 mL/min — ABNORMAL LOW (ref >60)
Potassium: 3.7 mEq/L (ref 3.5–5.1)
Sodium: 141 mEq/L (ref 135–145)

## 2010-10-31 LAB — CBC
Platelets: 451 10*3/uL — ABNORMAL HIGH (ref 150–400)
RBC: 2.49 MIL/uL — ABNORMAL LOW (ref 4.22–5.81)
WBC: 11.3 10*3/uL — ABNORMAL HIGH (ref 4.0–10.5)

## 2010-10-31 LAB — VANCOMYCIN, TROUGH: Vancomycin Tr: 21.4 ug/mL — ABNORMAL HIGH (ref 10.0–20.0)

## 2010-11-01 LAB — GLUCOSE, CAPILLARY
Glucose-Capillary: 130 mg/dL — ABNORMAL HIGH (ref 70–99)
Glucose-Capillary: 136 mg/dL — ABNORMAL HIGH (ref 70–99)
Glucose-Capillary: 178 mg/dL — ABNORMAL HIGH (ref 70–99)

## 2010-11-01 LAB — COMPREHENSIVE METABOLIC PANEL
ALT: 41 U/L (ref 0–53)
Alkaline Phosphatase: 108 U/L (ref 39–117)
CO2: 21 mEq/L (ref 19–32)
Calcium: 7.9 mg/dL — ABNORMAL LOW (ref 8.4–10.5)
GFR calc non Af Amer: 46 mL/min — ABNORMAL LOW (ref >60)
Glucose, Bld: 138 mg/dL — ABNORMAL HIGH (ref 70–99)
Sodium: 138 mEq/L (ref 135–145)
Total Bilirubin: 0.4 mg/dL (ref 0.3–1.2)

## 2010-11-01 LAB — MAGNESIUM: Magnesium: 1.5 mg/dL (ref 1.5–2.5)

## 2010-11-02 LAB — BASIC METABOLIC PANEL
Calcium: 8.1 mg/dL — ABNORMAL LOW (ref 8.4–10.5)
Creatinine, Ser: 1.46 mg/dL (ref 0.4–1.5)
GFR calc Af Amer: 59 mL/min — ABNORMAL LOW (ref >60)
GFR calc non Af Amer: 49 mL/min — ABNORMAL LOW (ref >60)
Sodium: 139 mEq/L (ref 135–145)

## 2010-11-02 LAB — CBC
Hemoglobin: 7.9 g/dL — ABNORMAL LOW (ref 13.0–17.0)
MCHC: 32.9 g/dL (ref 30.0–36.0)
Platelets: 500 10*3/uL — ABNORMAL HIGH (ref 150–400)
RDW: 13.5 % (ref 11.5–15.5)

## 2010-11-02 LAB — GLUCOSE, CAPILLARY
Glucose-Capillary: 153 mg/dL — ABNORMAL HIGH (ref 70–99)
Glucose-Capillary: 181 mg/dL — ABNORMAL HIGH (ref 70–99)
Glucose-Capillary: 142 mg/dL — ABNORMAL HIGH (ref 70–99)

## 2010-11-02 LAB — PHOSPHORUS: Phosphorus: 3 mg/dL (ref 2.3–4.6)

## 2010-11-03 LAB — MAGNESIUM: Magnesium: 1.8 mg/dL (ref 1.5–2.5)

## 2010-11-03 LAB — CBC
Hemoglobin: 8.1 g/dL — ABNORMAL LOW (ref 13.0–17.0)
MCHC: 33.1 g/dL (ref 30.0–36.0)
Platelets: 510 10*3/uL — ABNORMAL HIGH (ref 150–400)
RBC: 2.69 MIL/uL — ABNORMAL LOW (ref 4.22–5.81)

## 2010-11-03 LAB — BASIC METABOLIC PANEL
CO2: 22 mEq/L (ref 19–32)
Calcium: 8.3 mg/dL — ABNORMAL LOW (ref 8.4–10.5)
Chloride: 112 mEq/L (ref 96–112)
Creatinine, Ser: 1.35 mg/dL (ref 0.4–1.5)
GFR calc Af Amer: >60 mL/min (ref >60)
Sodium: 140 mEq/L (ref 135–145)

## 2010-11-03 LAB — GLUCOSE, CAPILLARY
Glucose-Capillary: 126 mg/dL — ABNORMAL HIGH (ref 70–99)
Glucose-Capillary: 128 mg/dL — ABNORMAL HIGH (ref 70–99)
Glucose-Capillary: 146 mg/dL — ABNORMAL HIGH (ref 70–99)

## 2010-11-03 LAB — PHOSPHORUS: Phosphorus: 3.3 mg/dL (ref 2.3–4.6)

## 2010-11-05 LAB — GLUCOSE, CAPILLARY: Glucose-Capillary: 130 mg/dL — ABNORMAL HIGH (ref 70–99)

## 2010-11-05 NOTE — H&P (Signed)
[name], [name]              ACCOUNT NO.:  [id number]  MEDICAL RECORD NO.:  [id number]           PATIENT TYPE:  I  LOCATION:  2304                         FACILITY:  [place]  PHYSICIAN:  Dickey Gave, MDDATE OF BIRTH:  1950-11-08  DATE OF ADMISSION:  10/22/2010 DATE OF DISCHARGE:                             HISTORY & PHYSICAL   REQUESTING PHYSICIAN:  Alfonzo Beers, MD  PRIMARY CARE PHYSICIAN:  None.  ADMITTING PHYSICIAN:  Dickey Gave, MD  CHIEF COMPLAINT:  Worsening right upper quadrant abdominal pain.  HISTORY OF PRESENT ILLNESS:  [name] is a 60 year old Hispanic male with a history of hypertension, diabetes, paroxysmal atrial fibrillation, ventilator-dependent respiratory failure, now resolved, chronic kidney disease who is known to our service for prior bowel perforation after hernia repair at [facility].  The patient was transferred down here secondary to sepsis and was taken back to the operating room where he had multiple surgeries including a small bowel resection secondary to perforated bowel.  He then underwent several laparotomies with washout and subsequent closure.  The patient did have a complicated hospital course with renal failure and ventilator- dependent respiratory failure.  He was sent home just within the past 3 weeks.  This past Friday, the patient started having right upper quadrant abdominal pain.  His daughter says that he is having subjective fevers along with chills and rigors.  He did have a loose bowel movement yesterday.  He has had some nausea as well as emesis.  He came to the emergency department today secondary to the above complaints.  He got a CT scan, which revealed multiple abdominal fluid collections and a possible anastomotic bowel leak.  He is also found to have a white blood cell count of 20,000.  Because of this finding, we were asked to evaluate the patient for surgical admission.  REVIEW OF SYSTEMS:  Please  see HPI, otherwise all other systems have been reviewed and are negative.  FAMILY HISTORY:  Noncontributory.  PAST MEDICAL HISTORY: 1. Diabetes mellitus. 2. Hypertension. 3. Paroxysmal atrial fibrillation. 4. Chronic kidney disease. 5. Ventilator-dependent respiratory failure, now resolved. 6. History of recent sepsis.  PAST SURGICAL HISTORY: 1. Open appendectomy. 2. Incarcerated hernia repair. 3. Laparoscopic incisional ventral hernia repair with Physiomesh. 4. Laparotomy with small bowel resection with subsequent laparotomies     and washout.  SOCIAL HISTORY:  The patient is married.  He has two daughters.  He denies any alcohol, tobacco, or illicit drug abuse.  ALLERGIES:  NKDA.  MEDICATIONS:  Unknown at this time as MedRec sheet has not been completed.  He does take lisinopril for his blood pressure, but otherwise medications are unknown.  PHYSICAL EXAMINATION:  GENERAL:  [name] is a pleasant Hispanic male who does not speak English, who is currently lying in bed in no acute distress. VITAL SIGNS:  Temperature 97.0, pulse 88, respirations 26, blood pressure 118/68. HEENT:  Head is normocephalic, atraumatic.  Sclerae are noninjected. Pupils are equal, round and reactive to light.  Ears and nose without any obvious masses or lesions.  No rhinorrhea.  Mouth is pink.  Throat shows no exudate.  HEART:  Regular rate and rhythm.  Normal S1-S2.  No murmurs, gallops or rubs are noted.  He does have palpable carotid, radial and pedal pulses bilaterally. LUNGS:  Clear to auscultation bilaterally with no wheezes, rhonchi or rales noted.  Respiratory effort is nonlabored. ABDOMEN:  Soft and nondistended.  He has absent and hypoactive bowel sounds.  He is tender in the right upper quadrant as well as the right lower quadrant.  He does have some mild tenderness in the left lower quadrant.  No peritoneal signs are noted with minimal voluntary guarding.  He does have several scars  noted on his abdomen from all of his prior surgeries.  MUSCULOSKELETAL:  All four extremities are symmetrical with no cyanosis, clubbing or edema. SKIN:  Warm and dry with no masses, lesions or rashes. PSYCHIATRY:  The patient is alert and oriented x3 with an appropriate affect.  LABS AND DIAGNOSTICS:  White blood cell count is 20,000, hemoglobin is 10.2, hematocrit 29.6, platelet count is 218,000.  Sodium 129, potassium 6.0, glucose is 140, BUN 45, creatinine 2.82, total bilirubin 2.1, alkaline phosphatase 77, AST 40, ALT 10, albumin is 2.7.  DIAGNOSTICS:  CT scan of the abdomen and pelvis reveals multiple fluid collections scattered throughout the abdomen.  There is a question of an anastomotic leak given a close fluid collection with fluid and gas present.  IMPRESSION: 1. Abdominal pain with possible anastomotic leak and multiple fluid     collections, status post recent laparotomy with small bowel     resection. 2. Hyperkalemia/hyponatremia. 3. Acute renal failure. 4. Hypertension. 5. Diabetes mellitus.  PLAN:  At this time, I have discussed this patient with Dr. Rolm Bookbinder.  Upon his evaluation of the CT scan as well as review with Dr. Aletta Edouard and Dr. Maryclare Bean, we feel that taking the patient to the operating room for surgical exploration would be best for the patient.  We have obtained a Medicine consult as well as a Renal consultation for their assistance.  Of note, the patient did receive IV contrast despite renal failure.  A "safety zone portal" has been completed for this incident.  Renal has been consulted for assistance with management of hyperkalemia and renal failure.  In the meantime, we will start the patient on IV Invanz as well as IV fluids. The procedure has been explained to the patient as well as his family, it has been translated via his daughter.  They are agreeable for surgical exploration and at this time.     Henreitta Cea,  PA   ______________________________ Dickey Gave, MD    KEO/MEDQ  D:  10/22/2010  T:  10/23/2010  Job:  [id number]  cc:   Darrold Span. Florene Glen, M.D. Lottie Dawson, MD Adin Hector, MD Jamesetta So, M.D.  Electronically Signed by Saverio Danker PA on 11/02/2010 01:25:17 PM Electronically Signed by Rolm Bookbinder MD on 11/04/2010 08:24:30 PM

## 2010-11-19 ENCOUNTER — Other Ambulatory Visit: Payer: Self-pay | Admitting: General Surgery

## 2010-11-19 DIAGNOSIS — E46 Unspecified protein-calorie malnutrition: Secondary | ICD-10-CM

## 2010-11-19 DIAGNOSIS — K660 Peritoneal adhesions (postprocedural) (postinfection): Secondary | ICD-10-CM

## 2010-11-19 DIAGNOSIS — K651 Peritoneal abscess: Secondary | ICD-10-CM

## 2010-11-19 DIAGNOSIS — R188 Other ascites: Secondary | ICD-10-CM

## 2010-11-19 DIAGNOSIS — O85 Puerperal sepsis: Secondary | ICD-10-CM

## 2010-11-19 DIAGNOSIS — K631 Perforation of intestine (nontraumatic): Secondary | ICD-10-CM

## 2010-11-21 ENCOUNTER — Ambulatory Visit
Admission: RE | Admit: 2010-11-21 | Discharge: 2010-11-21 | Disposition: A | Discharge status: Home / Self Care | Payer: No Typology Code available for payment source | Source: Ambulatory Visit | Attending: General Surgery | Admitting: General Surgery

## 2010-11-21 DIAGNOSIS — O85 Puerperal sepsis: Secondary | ICD-10-CM

## 2010-11-21 DIAGNOSIS — K651 Peritoneal abscess: Secondary | ICD-10-CM

## 2010-11-21 MED ORDER — IOHEXOL 300 MG/ML  SOLN
80.0000 mL | Freq: Once | INTRAMUSCULAR | Status: AC | PRN
  Administered 2010-11-21: 80 mL via INTRAVENOUS

## 2010-12-11 ENCOUNTER — Encounter: Payer: Self-pay | Admitting: Internal Medicine

## 2011-02-04 ENCOUNTER — Other Ambulatory Visit: Payer: Self-pay | Admitting: Adult Health

## 2011-02-13 ENCOUNTER — Telehealth: Payer: Self-pay | Admitting: *Deleted

## 2011-02-13 ENCOUNTER — Telehealth: Payer: Self-pay

## 2011-02-13 NOTE — Telephone Encounter (Signed)
Pt's daughter wants refills for pt's meds but pt. has never been seen in this office. Pt. was to f/u here after hosp. stay in February but did not schedule appt. We received call from pt's daughter on 10-18-10 asking [place] to refill pt's meds. We did give refills and scheduled appt. for 10-23-10. Pt. was hospitalized again and could not keep that appt. Pt's daughter advised that I would call her back after clinic today, once I have opportunity to look over hosp. notes. She stated "maybe I'll just call another Cardiologist then" and ended phone call.

## 2011-02-13 NOTE — Telephone Encounter (Signed)
Clonadine 0.1mg  (1 tablet po bid  Requesting a refill [place], [place]  We have not seen patient in the office. Has been in [facility] hospital over the past several months and unable To keep appointments with [facility]. Daughter called office today stating that everytime she calls [facility] that the people are not friendly and she wants to have her father seen in the [employer].

## 2011-02-14 MED ORDER — CLONIDINE HCL 0.1 MG PO TABS: 0.1000 mg | ORAL_TABLET | Freq: Two times a day (BID) | ORAL | Status: DC

## 2011-02-14 NOTE — Telephone Encounter (Signed)
Has OV on 8/10.  #30 tabs sent to pharm below.

## 2011-02-20 ENCOUNTER — Encounter: Payer: Self-pay | Admitting: Cardiology

## 2011-03-01 ENCOUNTER — Encounter: Payer: Medicaid Other | Admitting: Cardiology

## 2011-05-02 ENCOUNTER — Encounter: Payer: Medicaid Other | Admitting: Cardiology

## 2012-01-02 IMAGING — CT CT ABD-PELV W/ CM
2 of 5 series · 15 of 46 positions shown, 17 images · IV contrast (Omnipaque 300)
Comparison: None.

CLINICAL DATA: Abdominal pain.  Hernia repair.

CT ABDOMEN AND PELVIS WITH CONTRAST
TECHNIQUE: Multidetector CT imaging of the abdomen and pelvis was
performed following the standard protocol during bolus
administration of intravenous contrast.
Contrast: 100 ml Wmnipaque-V99

[Series 2: abd_pel_with 5.0 b40f · axial · 0.80mm/px · z∈[-507,-27]mm · 12 of 110 slices shown, 14 images]
[im 7/110  soft-tissue]
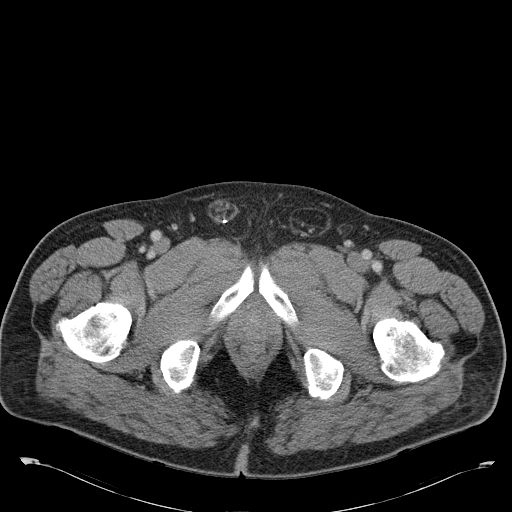
[im 7/110  bone]
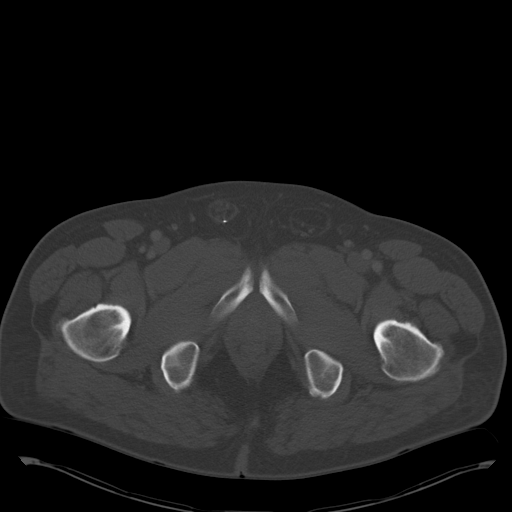
[im 20/110  soft-tissue]
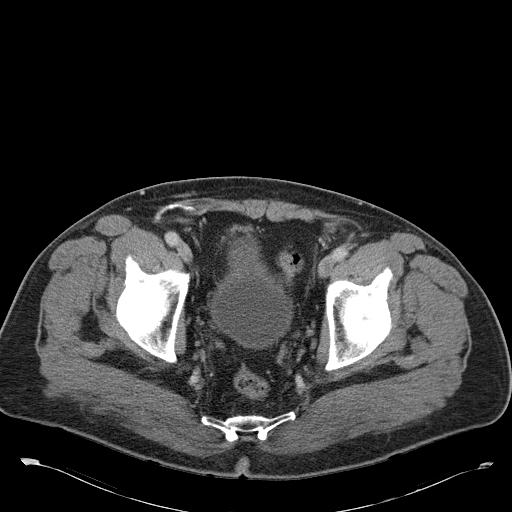
[im 26/110  soft-tissue]
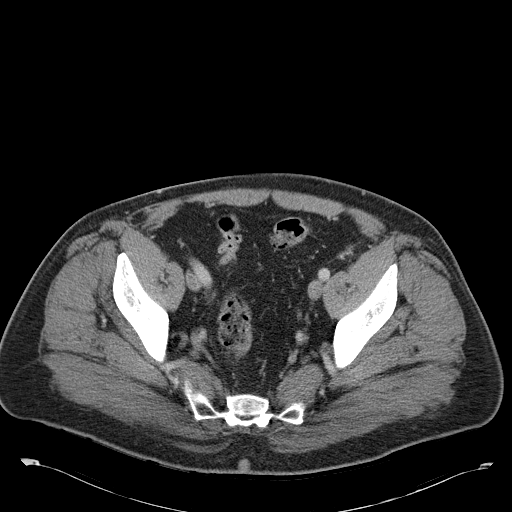
[im 33/110  soft-tissue]
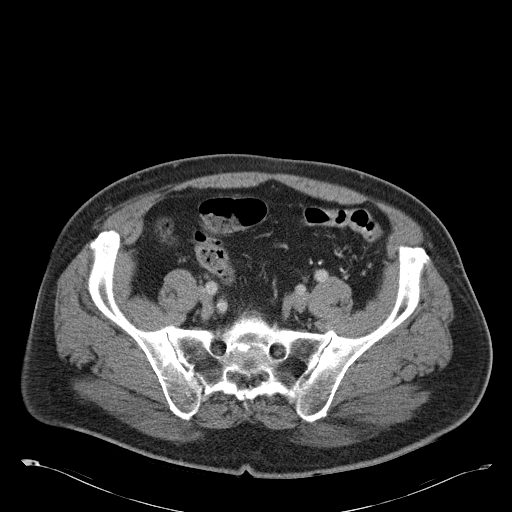
[im 45/110  soft-tissue]
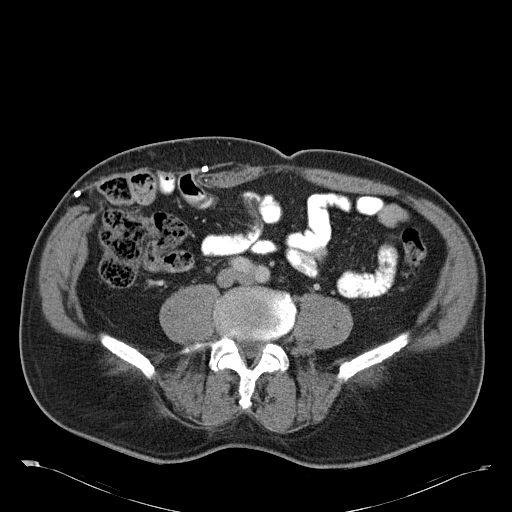
[im 52/110  soft-tissue]
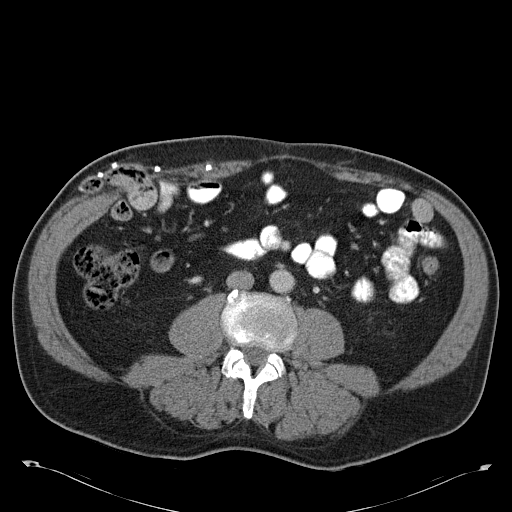
[im 58/110  soft-tissue]
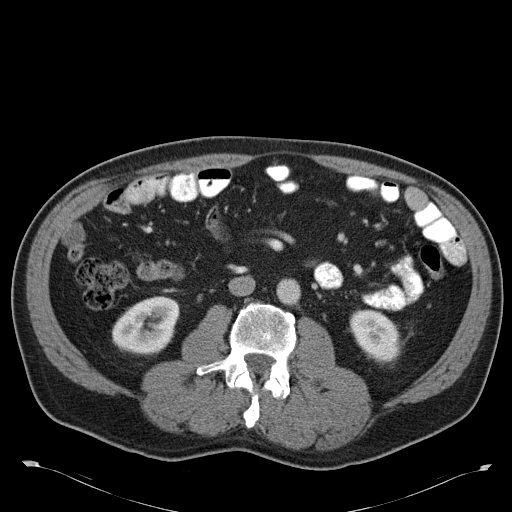
[im 71/110  soft-tissue]
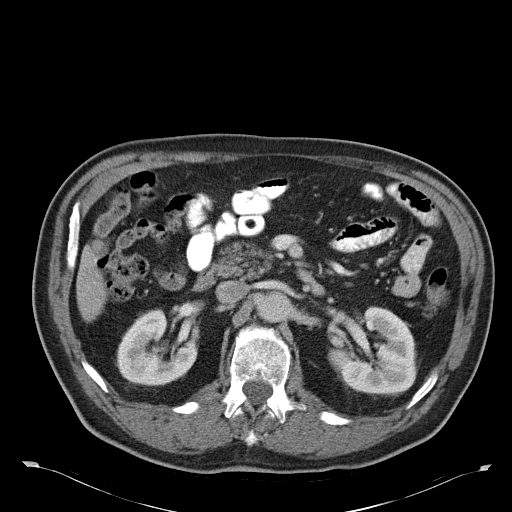
[im 77/110  soft-tissue]
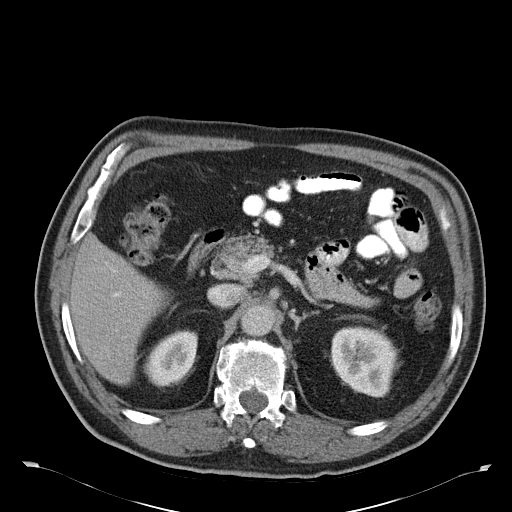
[im 77/110  bone]
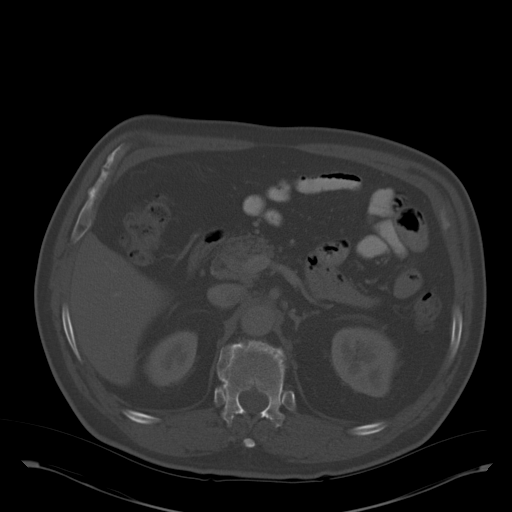
[im 84/110  soft-tissue]
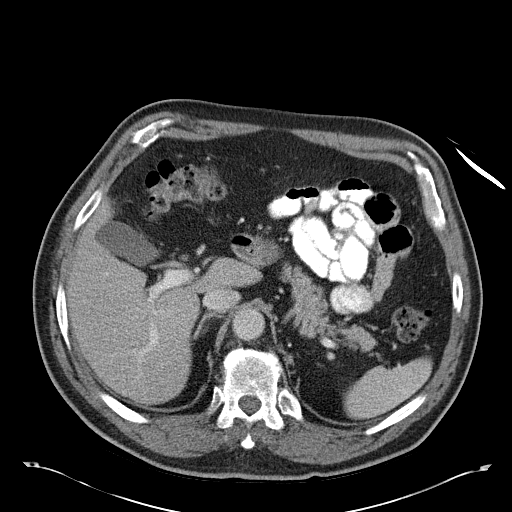
[im 97/110  soft-tissue]
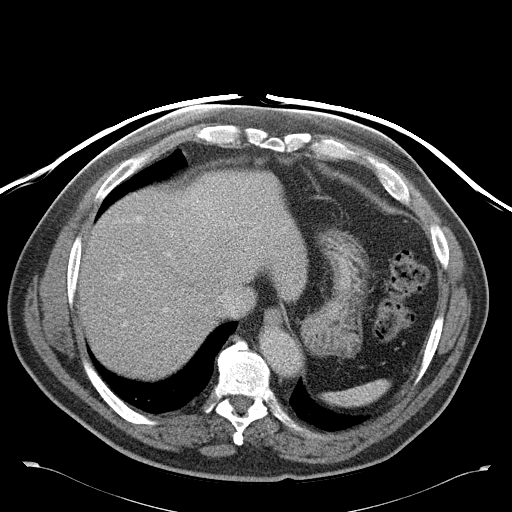
[im 103/110  soft-tissue]
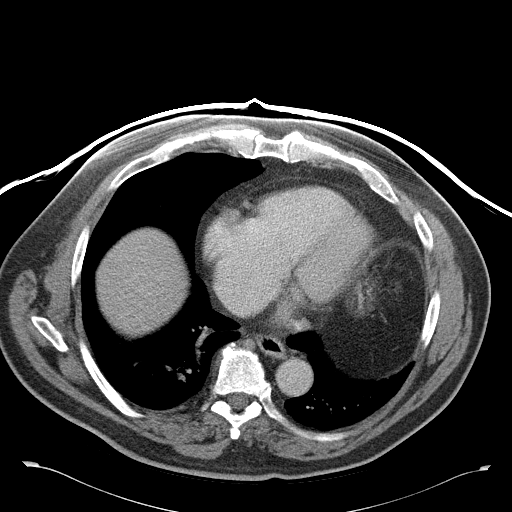

[Series 4: abd_pel_with 3.0 spo cor · coronal · 0.72mm/px · 3 of 81 slices shown]
[im 27/81  soft-tissue]
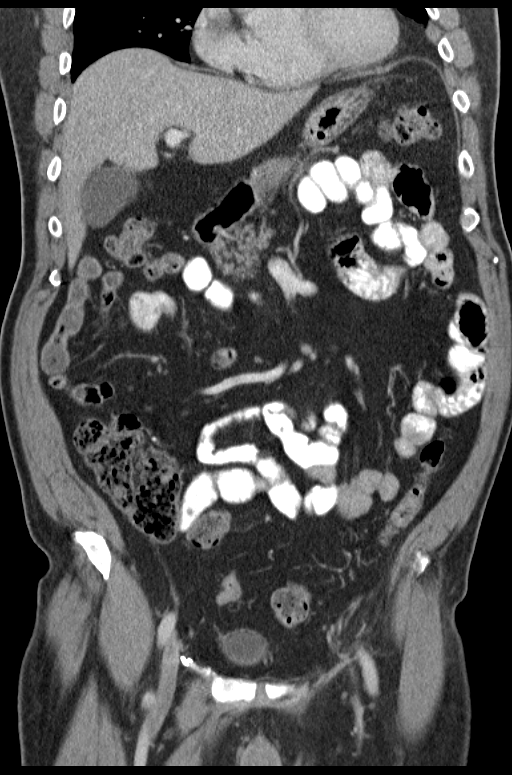
[im 36/81  soft-tissue]
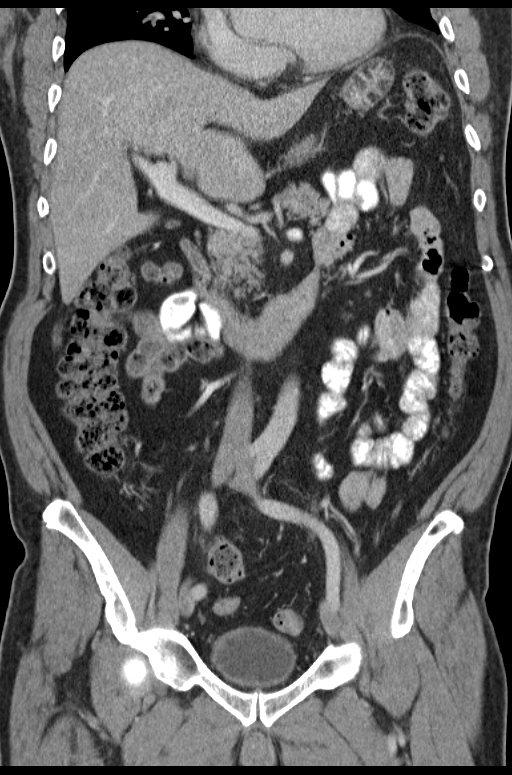
[im 45/81  soft-tissue]
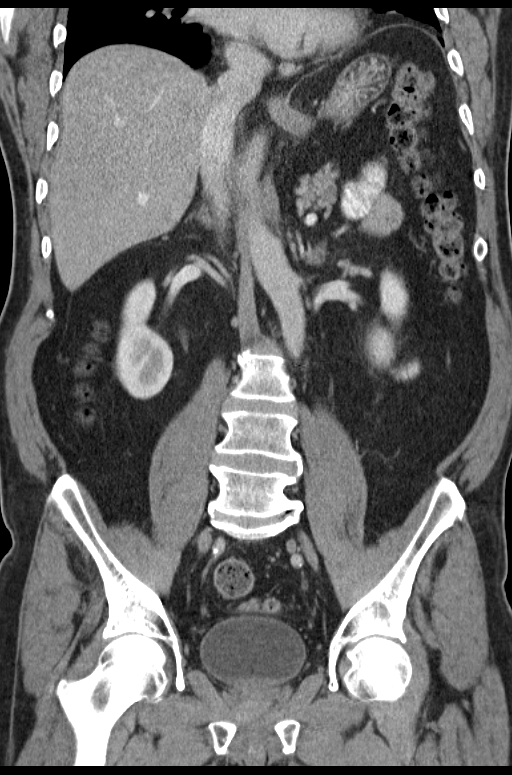

[15 of 46 positions shown; findings below may reference images not displayed]

FINDINGS: There is some linear opacity in the lingula and we
partially image a 1.3 x 1.2 cm nodular density in this vicinity
which probably represents a band of atelectasis or scarring.  Given
the patient's age and lack of prior imaging to assess the stability
of this finding, this will likely require follow-up to exclude the
unlikely possibility of malignancy.

Diffuse hepatic steatosis noted.  The spleen, pancreas, and adrenal
glands appear normal.

The gallbladder and biliary system appear unremarkable.

The kidneys appear unremarkable, as do the proximal ureters.

No pathologic retroperitoneal or porta hepatis adenopathy is
identified.

No pathologic pelvic adenopathy is identified.

There is a right sided Spigelian hernia.  There appears to be a
mesh in the vicinity of this hernia along the outer margin of the
rectus musculature and lateral abdominal wall musculature.  Some
laxity in the mesh permits the hernia even though the mesh remains
superficial to the hernia.  No strangulation is currently
identified.  The hernia sac contains small bowel.  No dilated bowel
to suggest obstruction.

There has been a right inguinal hernia repair

Urinary bladder appears unremarkable.

Posterior osseous ridging is present at L5-S1, causing with left
foraminal stenosis and possible right foraminal stenosis.
IMPRESSION: 1.  Right Spigelian hernia containing small bowel.  There is a
hernia mesh superficial to this hernia, which may be limiting the
overall size of the hernia sac.  No signs of strangulation.
2.  Prior right inguinal hernia repair.
3.  A specific cause for abdominal pain is not identified.

4.  Spondylosis with foraminal stenosis at L5 S1.
5.  Diffuse hepatic steatosis.
6.  Nodular scarring in the lingula.  Given the lack of prior
studies to establish stability of this appearance, and due to the
mildly nodular appearance, I would recommend a chest CT in 2-4
weeks time to establish a baseline of the entire chest and to see
if this area resolves.  Specifically this is recommended to exclude
the unlikely possibility of malignancy.

## 2012-03-12 IMAGING — CR DG CHEST 1V PORT
1 series · 1 of 1 positions shown · non-contrast
Comparison: None.

CLINICAL DATA: Irregular heart beat.  Rule out congestive heart
failure.  Status post surgery 08/29/2010.

PORTABLE CHEST - 1 VIEW

[view not recorded]
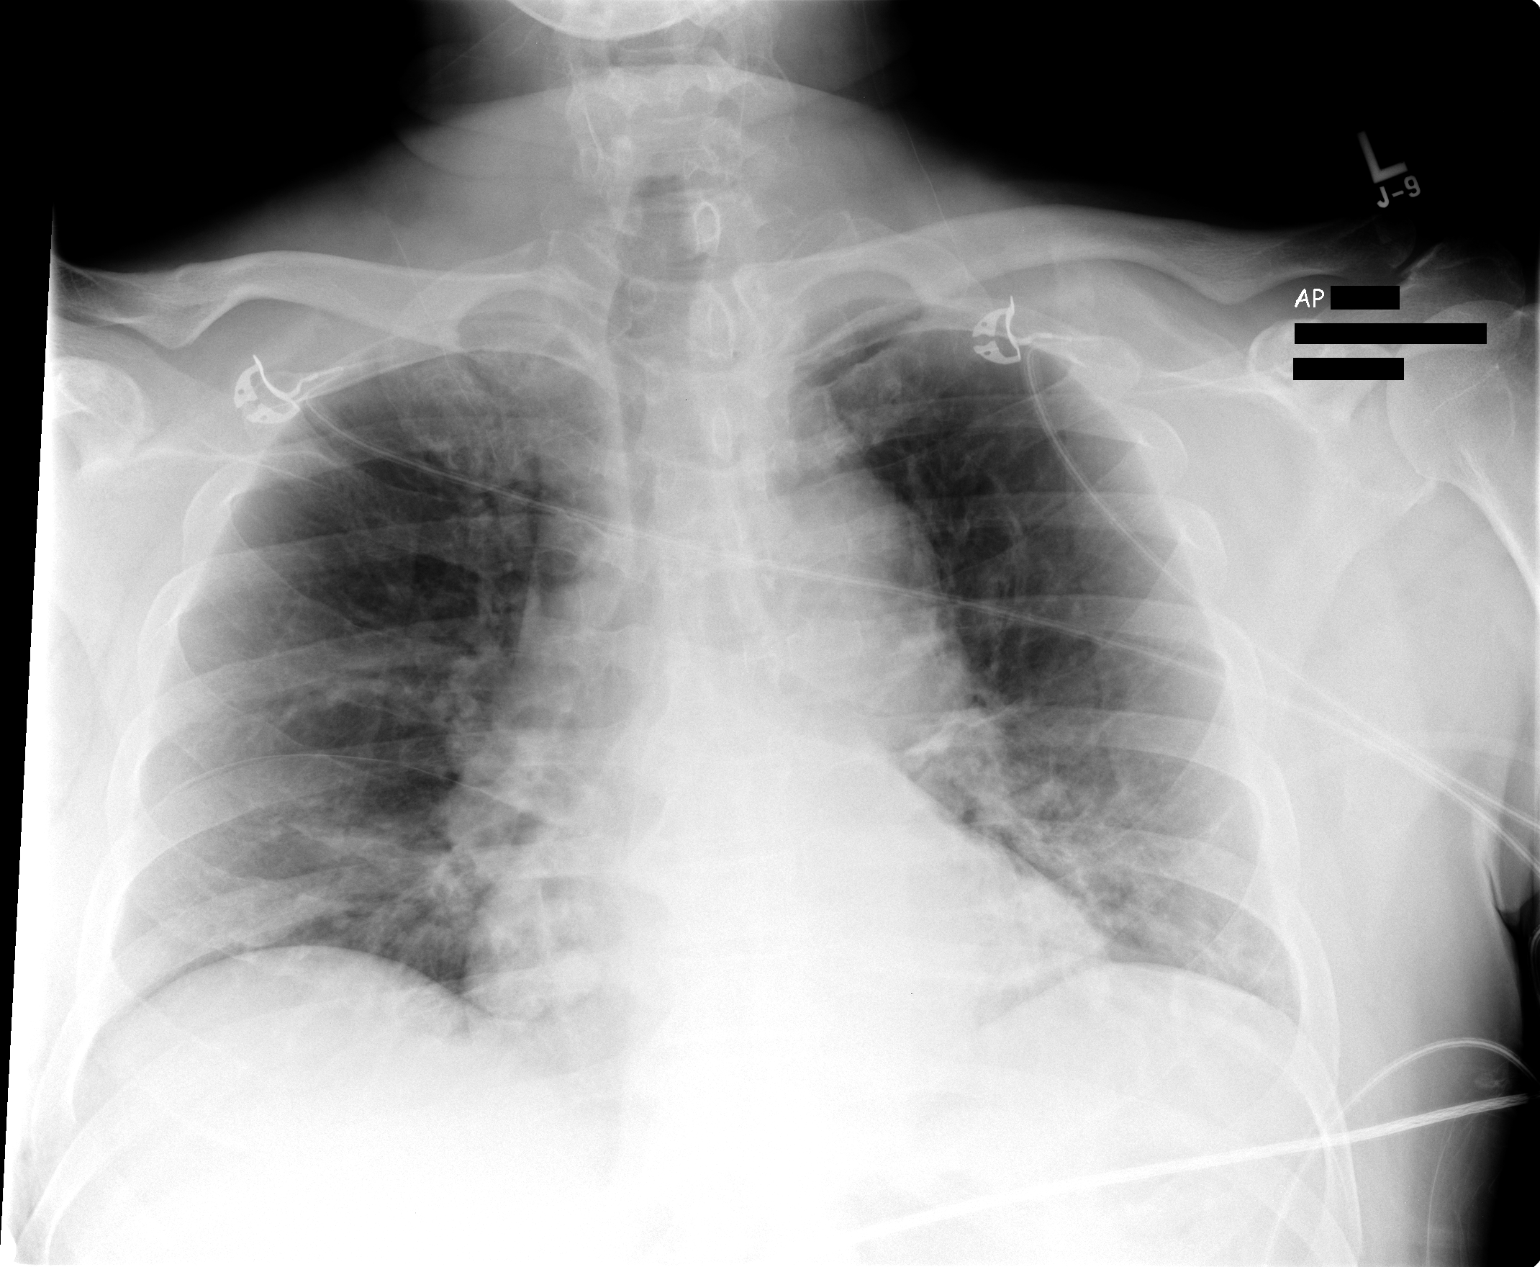

[1 of 1 positions shown; findings below may reference images not displayed]

FINDINGS: The heart size is exaggerated by low lung volumes.  The
aortic arch is prominent.  No displaced calcifications are evident.
The densities at the lung bases bilaterally likely reflect a
combination of atelectasis and scarring.  The upper lung fields are
clear.  No significant edema or effusions are present to suggest
failure.  The visualized soft tissues and bony thorax are
unremarkable.
IMPRESSION: 1.  Low lung volumes.
2.  Densities at the lung bases likely reflects atelectasis or
scarring.
3.  No evidence for congestive heart failure.

## 2012-03-17 IMAGING — CR DG CHEST 1V PORT
1 series · 1 of 1 positions shown · non-contrast
Comparison: 09/02/2010

CLINICAL DATA: Postoperative evaluation, atrial fibrillation and
ventricular tachycardia

PORTABLE CHEST - 1 VIEW

[view not recorded]
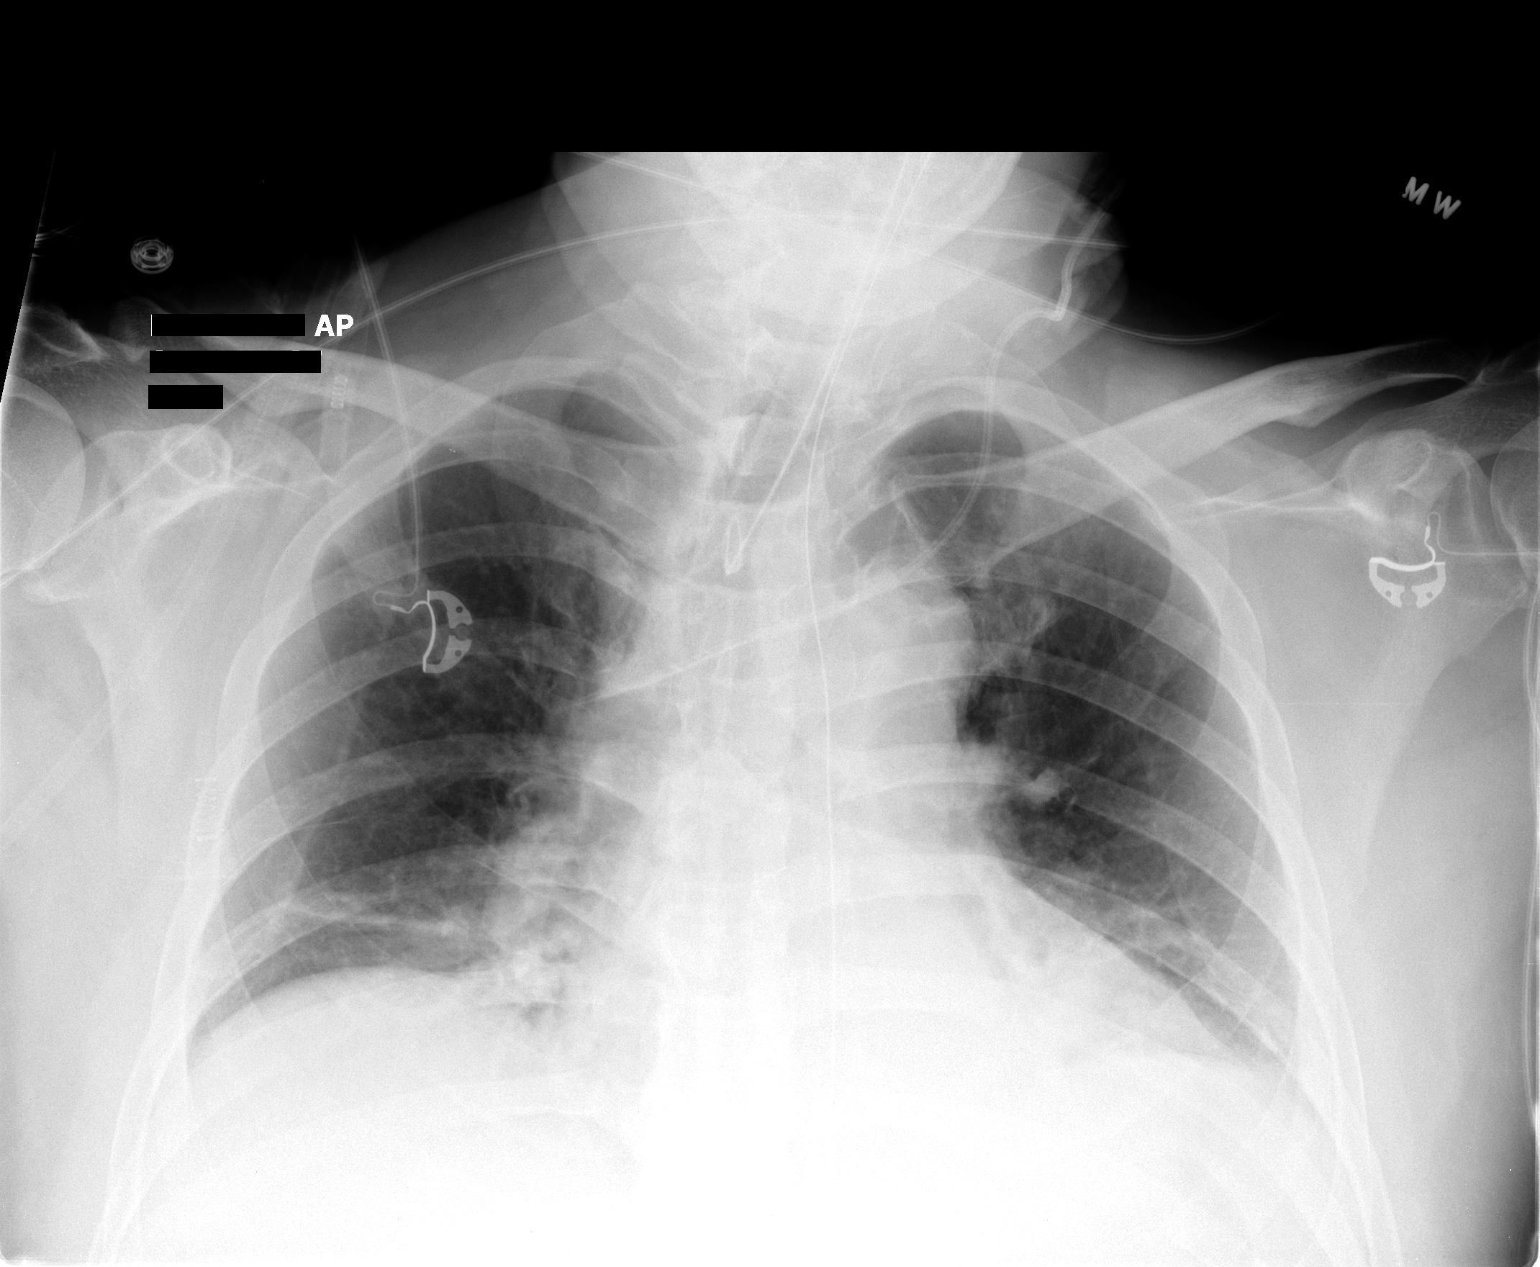

[1 of 1 positions shown; findings below may reference images not displayed]

FINDINGS: Interval placement of an enteric tube with the tip
terminating off the inferior aspect of this exam.  Endotracheal
tube and left internal jugular lines are unchanged.  Linear
bibasilar atelectasis persists, slightly increased on the left
side.  There is no edema.  The heart is unchanged.  The upper
abdomen and osseous structures are also unchanged.
IMPRESSION: Placement of an enteric tube with otherwise stable support
apparatus.  Increasing left base opacity, probably atelectasis.

## 2012-03-18 IMAGING — CR DG CHEST 1V PORT
1 series · 1 of 1 positions shown · non-contrast
Comparison: Chest 09/03/2010.

CLINICAL DATA: Intubated patient.

PORTABLE CHEST - 1 VIEW

[AP]
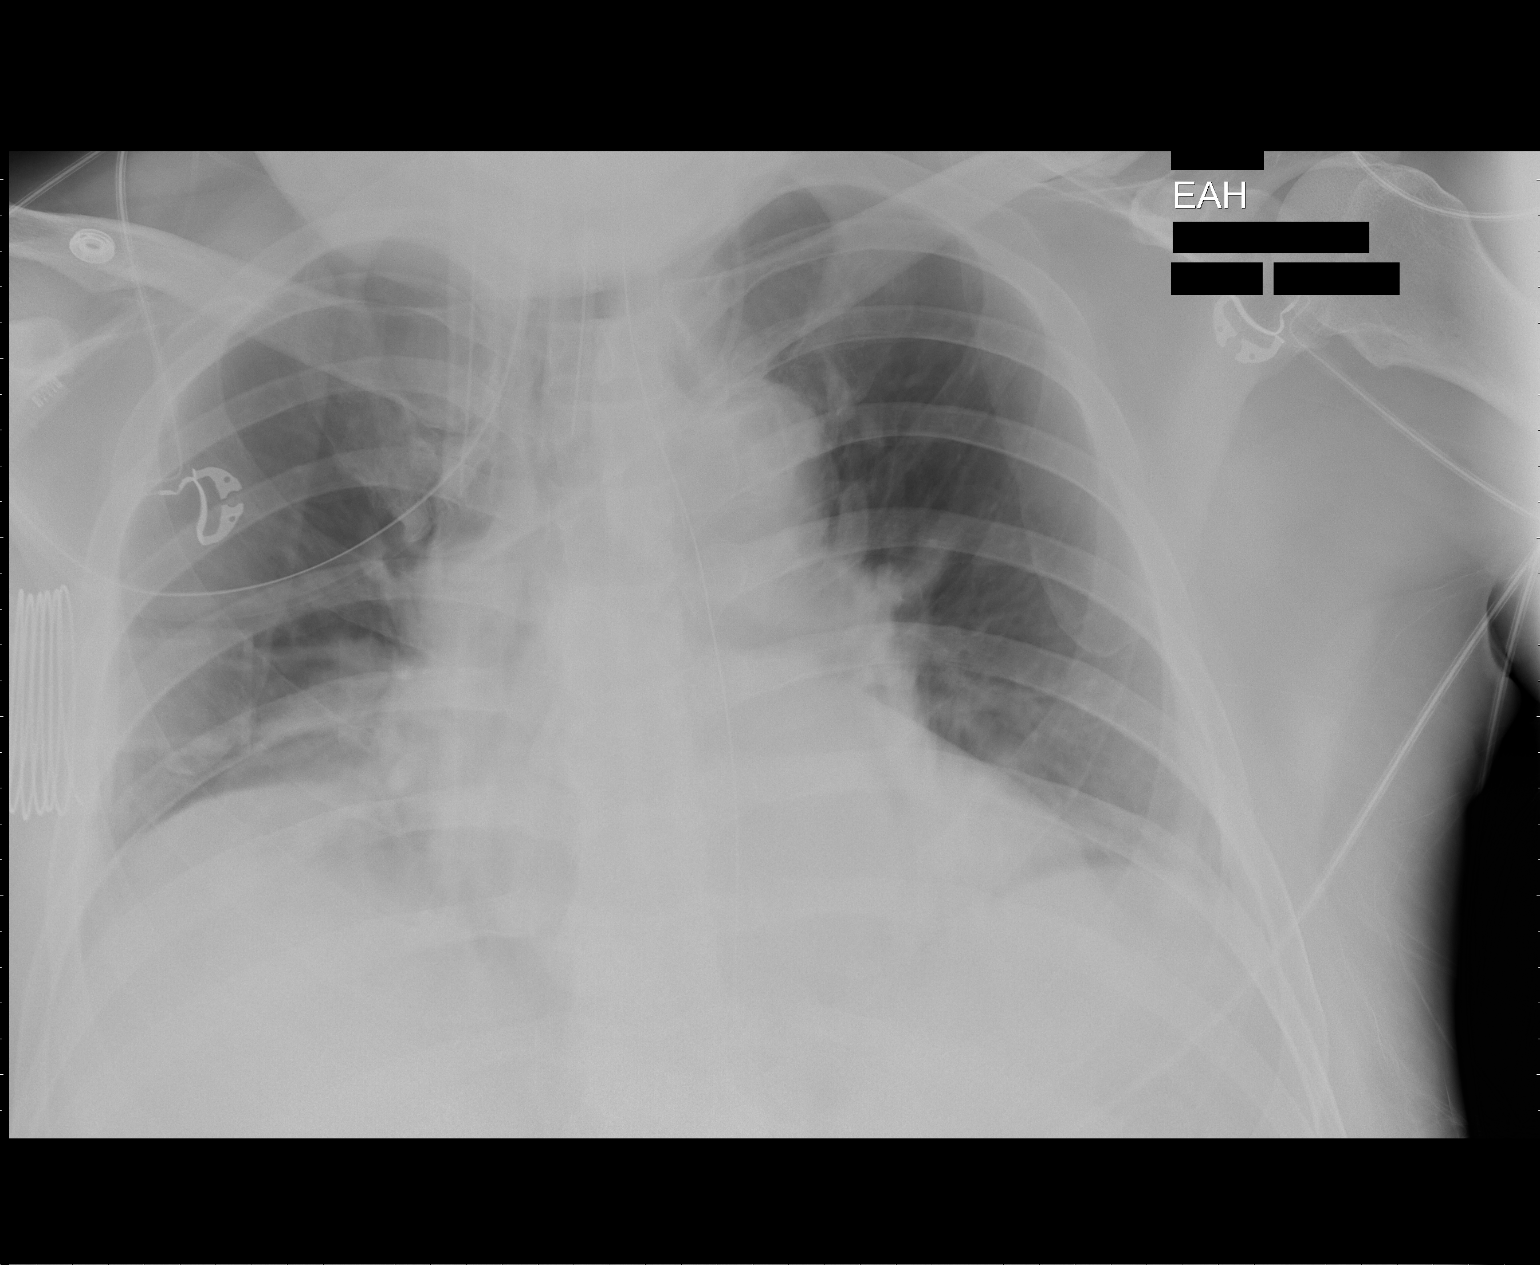

[1 of 1 positions shown; findings below may reference images not displayed]

FINDINGS: Support tubes and lines are unchanged.  Basilar airspace
opacities persist.  Heart size upper normal.
IMPRESSION: No interval change.

## 2012-03-22 IMAGING — CR DG CHEST 1V PORT
1 series · 1 of 1 positions shown · non-contrast
Comparison: 09/07/2010

CLINICAL DATA: Respiratory distress, renal failure

PORTABLE CHEST - 1 VIEW

[view not recorded]
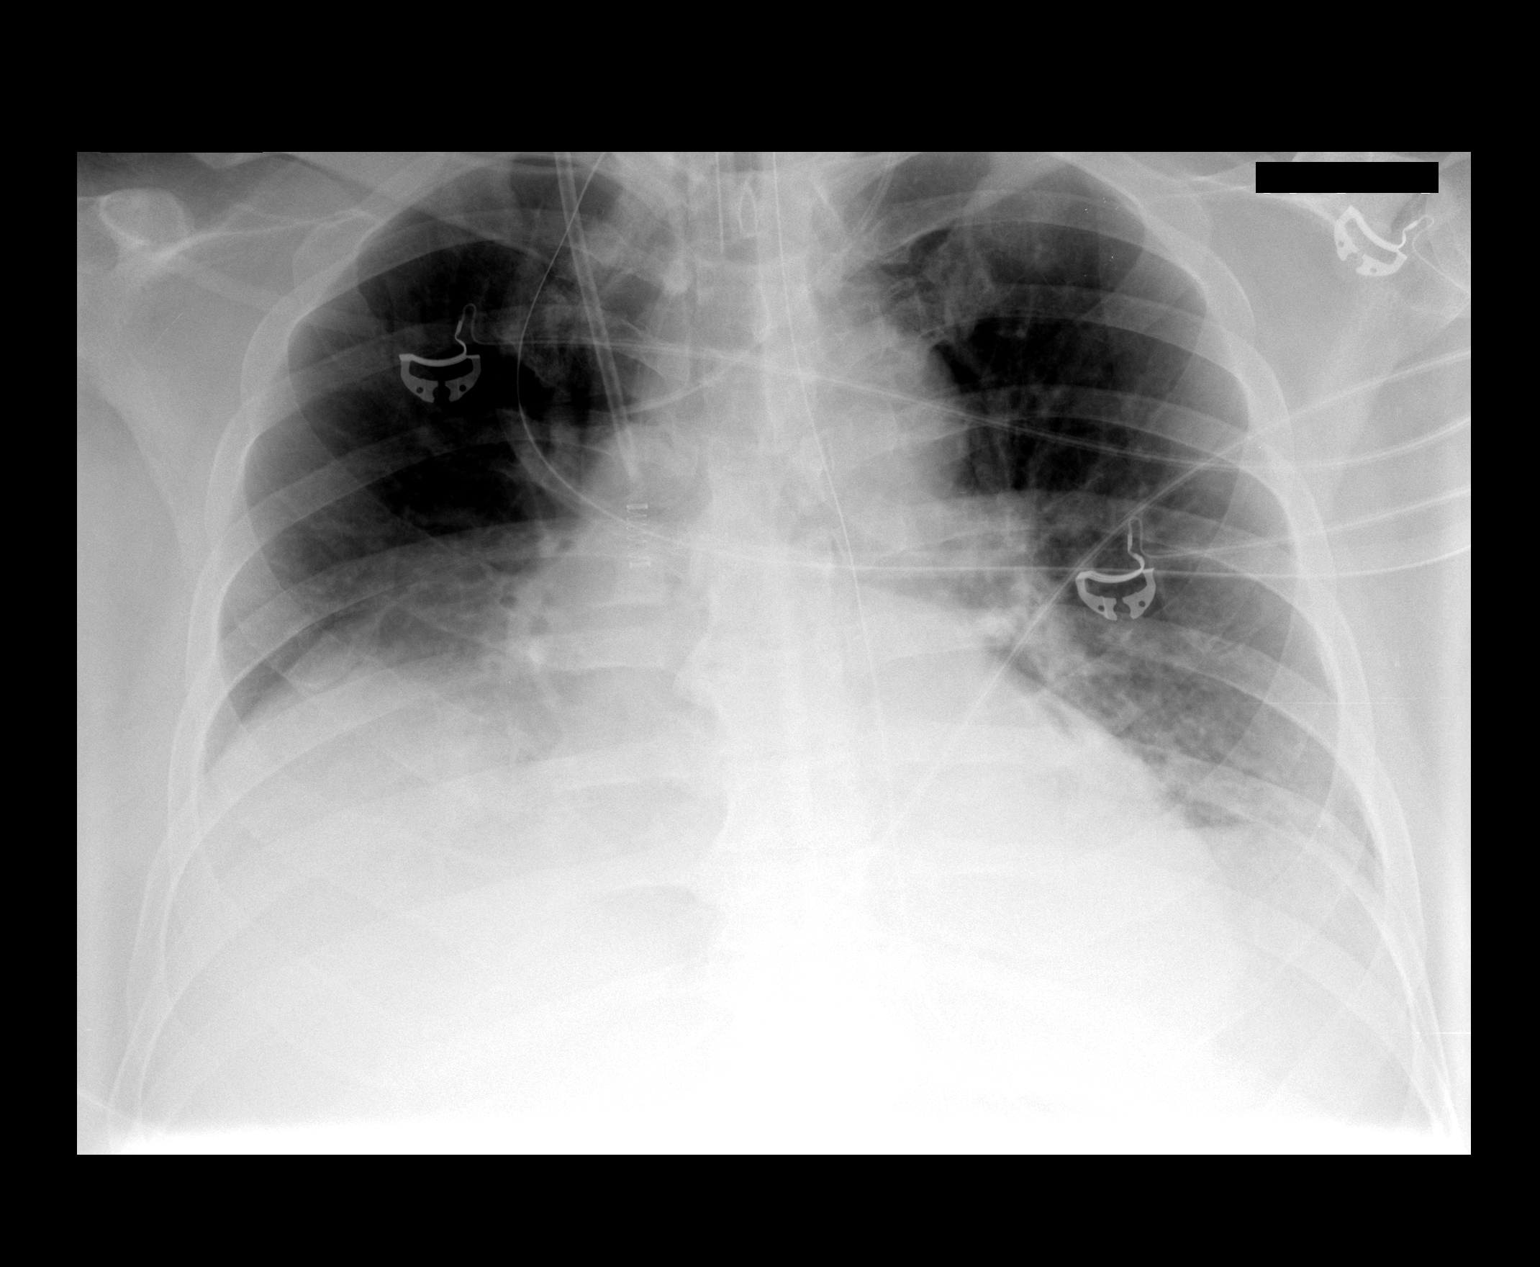

[1 of 1 positions shown; findings below may reference images not displayed]

FINDINGS: Stable support apparatus.  Heart remains enlarged with
bibasilar atelectasis, worse on the right.  Right hemidiaphragm is
elevated.  No enlarging effusion or pneumothorax.  No significant
interval change.
IMPRESSION: Stable portable chest exam.

## 2012-03-24 IMAGING — CR DG CHEST 1V PORT
1 series · 1 of 1 positions shown · non-contrast
Comparison: Yesterday

CLINICAL DATA: Assess endotracheal tube

PORTABLE CHEST - 1 VIEW

[view not recorded]
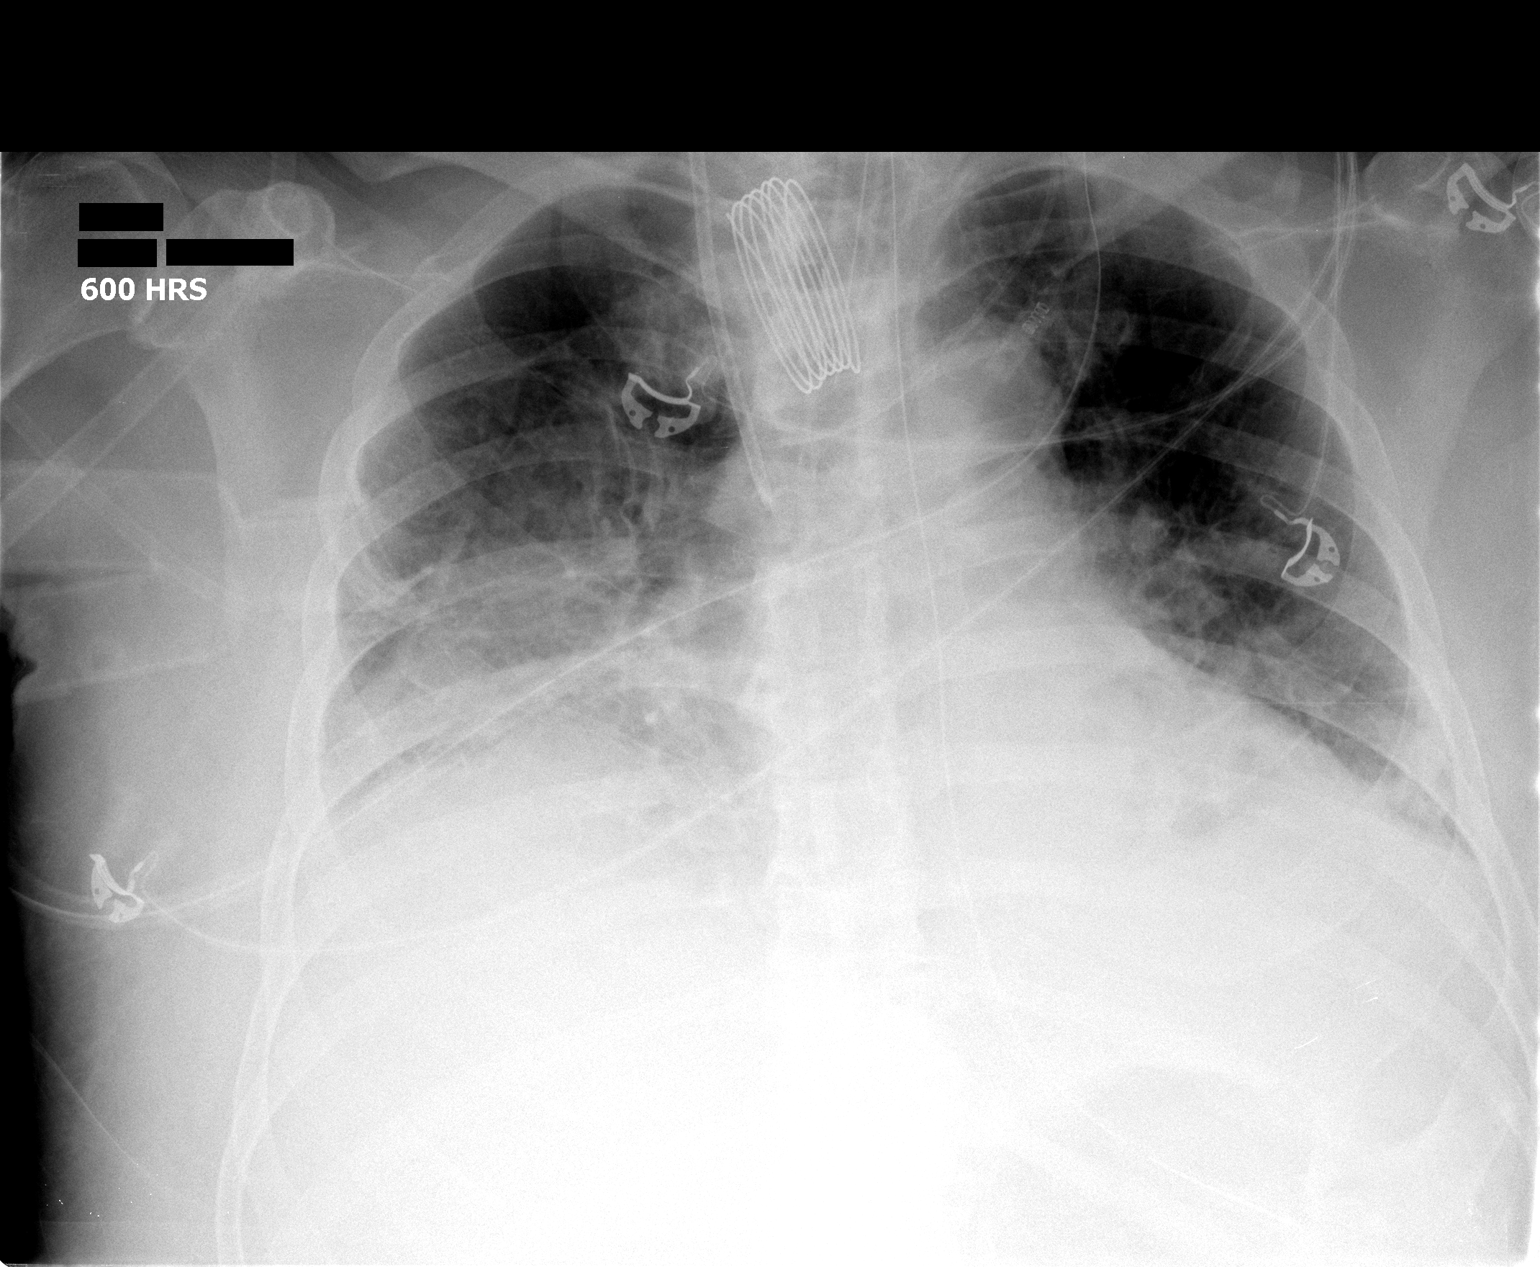

[1 of 1 positions shown; findings below may reference images not displayed]

FINDINGS: Stable tubular devices.  Bilateral airspace disease and
bilateral effusions are stable.  Endotracheal tube tip remains
cm from the carina.  No pneumothorax.  No acute bony deformity.
IMPRESSION: Stable endotracheal tube position, 3 cm from the carina.  Stable
airspace disease and pleural effusions.

## 2012-03-28 IMAGING — CT CT ABD-PELV W/O CM
1 of 4 series · 8 of 32 positions shown, 13 images · non-contrast
Comparison: Ultrasound of earlier today.  CT of 09/03/2010

CLINICAL DATA: Recent small bowel obstruction with perforation.
Status post repair.  Evaluate for abscess.  Renal failure.

CT ABDOMEN AND PELVIS WITHOUT CONTRAST
TECHNIQUE: Multidetector CT imaging of the abdomen and pelvis was
performed following the standard protocol without intravenous
contrast.

[Series 2: routine abdomen · axial · 0.87mm/px · z∈[-493,-58]mm · 8 of 113 slices shown, 13 images]
[im 13/113  soft-tissue]
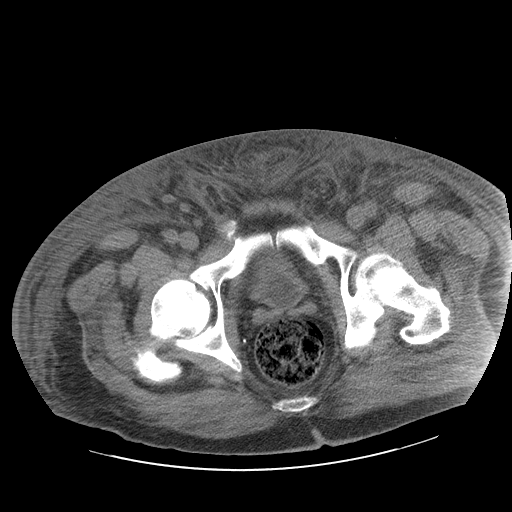
[im 13/113  bone]
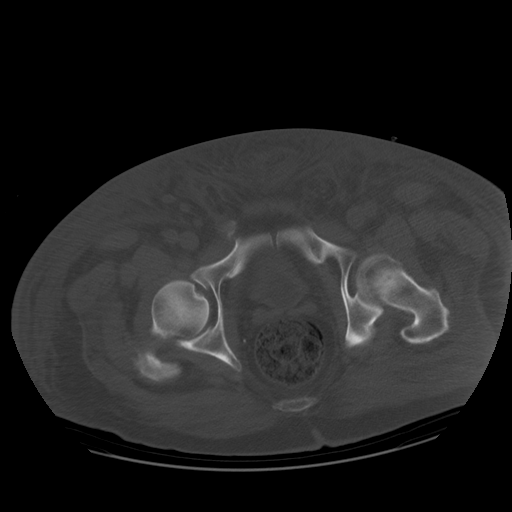
[im 25/113  soft-tissue]
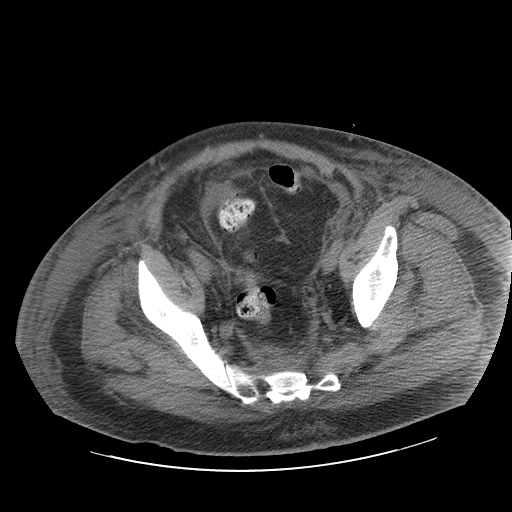
[im 38/113  soft-tissue]
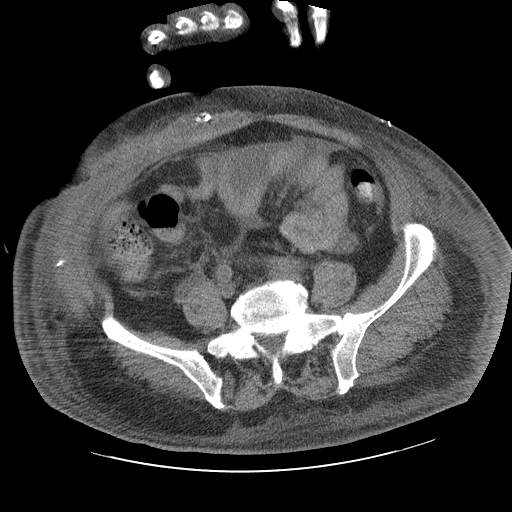
[im 50/113  soft-tissue]
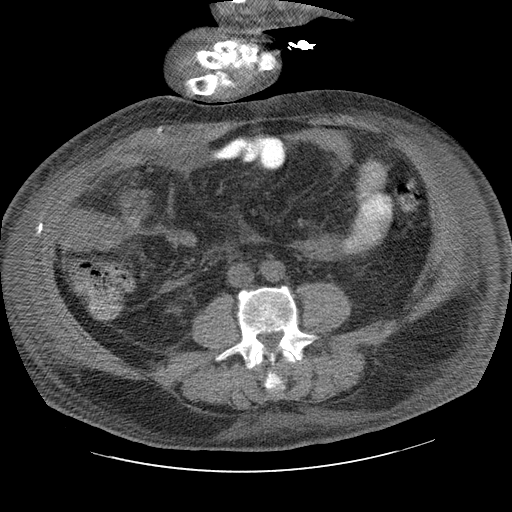
[im 63/113  soft-tissue]
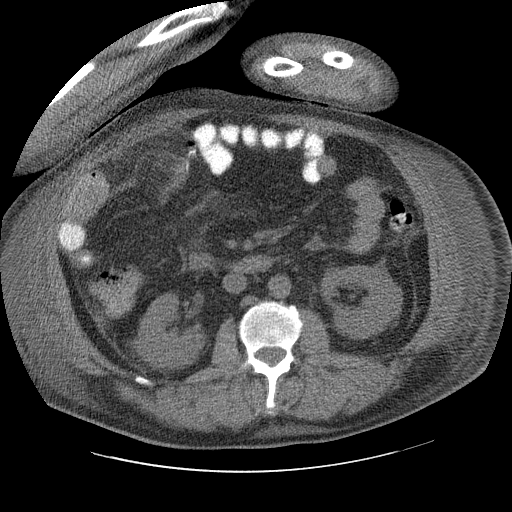
[im 63/113  lung]
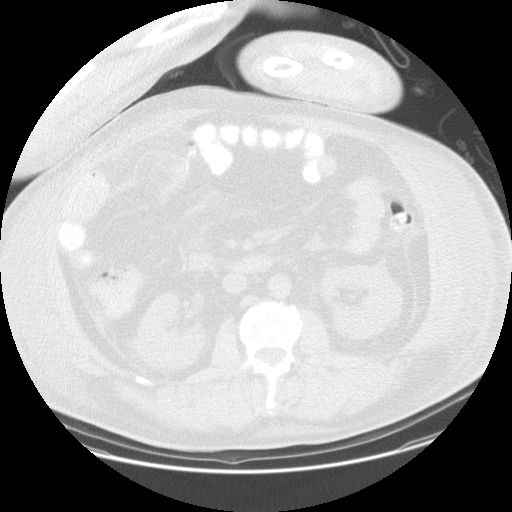
[im 75/113  soft-tissue]
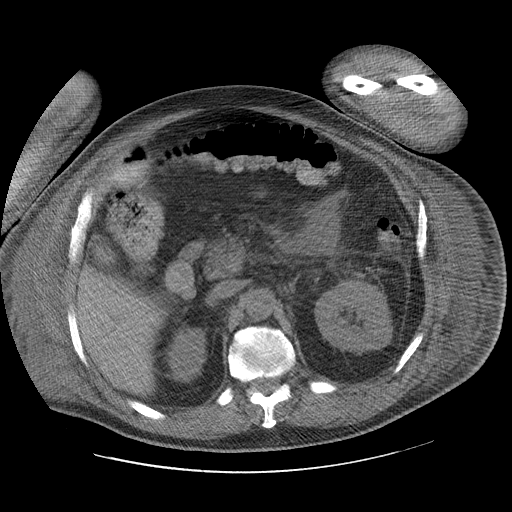
[im 75/113  lung]
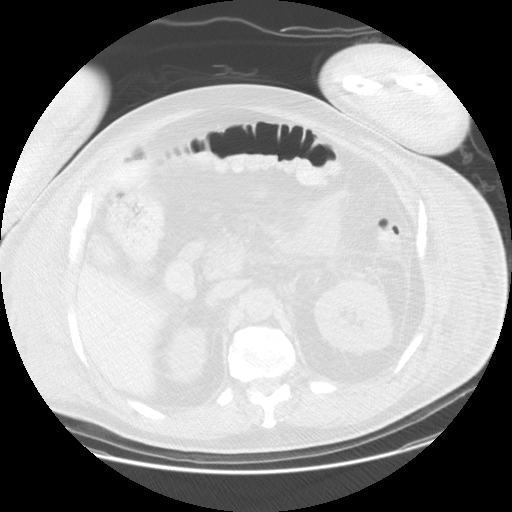
[im 88/113  soft-tissue]
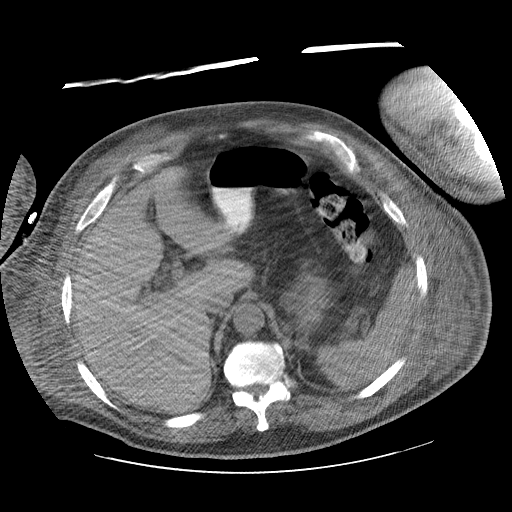
[im 88/113  lung]
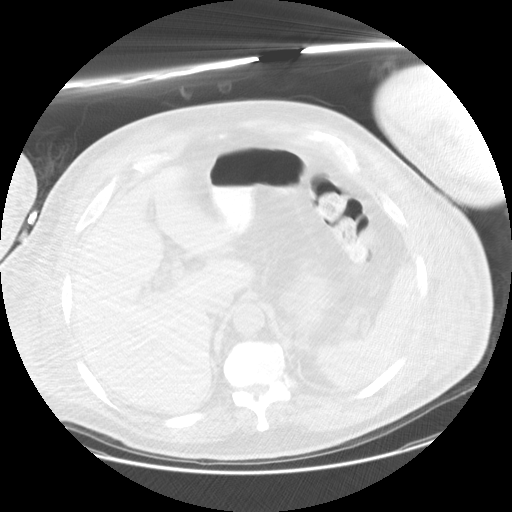
[im 100/113  soft-tissue]
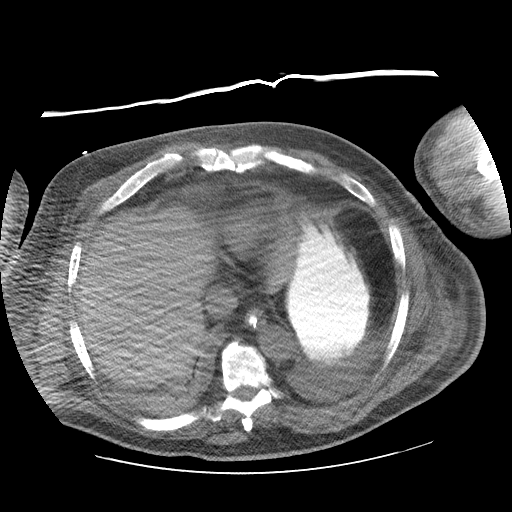
[im 100/113  lung]
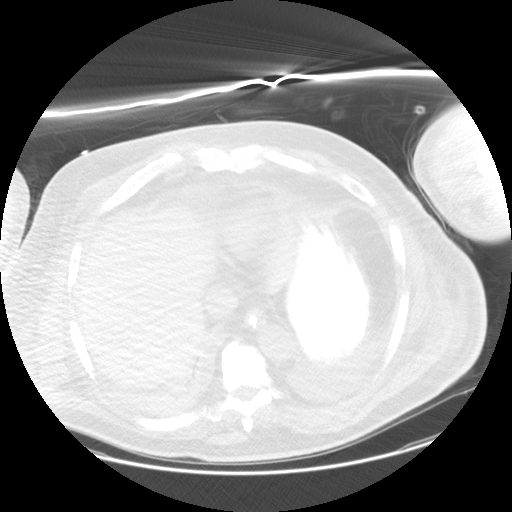

[8 of 32 positions shown; findings below may reference images not displayed]

FINDINGS: Right greater left bibasilar airspace disease, similar to
on the prior.  Mild cardiomegaly without pleural effusion.  Grossly
normal uninfused appearance of the liver and spleen.  A nasogastric
terminates at the body of the stomach.  Partially fatty replaced
pancreas.  Vicarious excretion of contrast by the gallbladder.  No
biliary ductal dilatation.  Normal adrenal glands.  Nonspecific
bilateral perirenal edema without hydronephrosis.

No retroperitoneal or retrocrural adenopathy.

Normal colon.

There is a simple appearing fluid within the jejunal mesenteric
leaves on image 35.  No well-defined collection in this area.  No
gas within the collection.  Other scattered areas of
intraperitoneal fluid are identified primarily in the upper pelvis.
A surgical drain is identified within the right anterior pelvis.

Enterotomy changes.  No bowel obstruction. No free intraperitoneal
air.  Surgical clips in the right inguinal region.  Fat containing
left greater than right inguinal hernias. No pelvic adenopathy.
Foley catheter within urinary bladder.  Normal prostate.

Diffuse anasarca.
IMPRESSION: 1.  Degraded exam secondary to lack of IV contrast and patient arm
position.
2.  Scattered foci of  intraperitoneal and mesenteric fluid.  No
convincing evidence of abscess.
3.  Bibasilar airspace disease, suspicious for infection.

## 2012-04-03 IMAGING — CR DG CHEST 1V PORT
1 series · 1 of 1 positions shown · non-contrast
Comparison: 09/18/2010 and 09/17/2010.

CLINICAL DATA: Follow-up.  Question pneumonia.

PORTABLE CHEST - 1 VIEW

[AP]
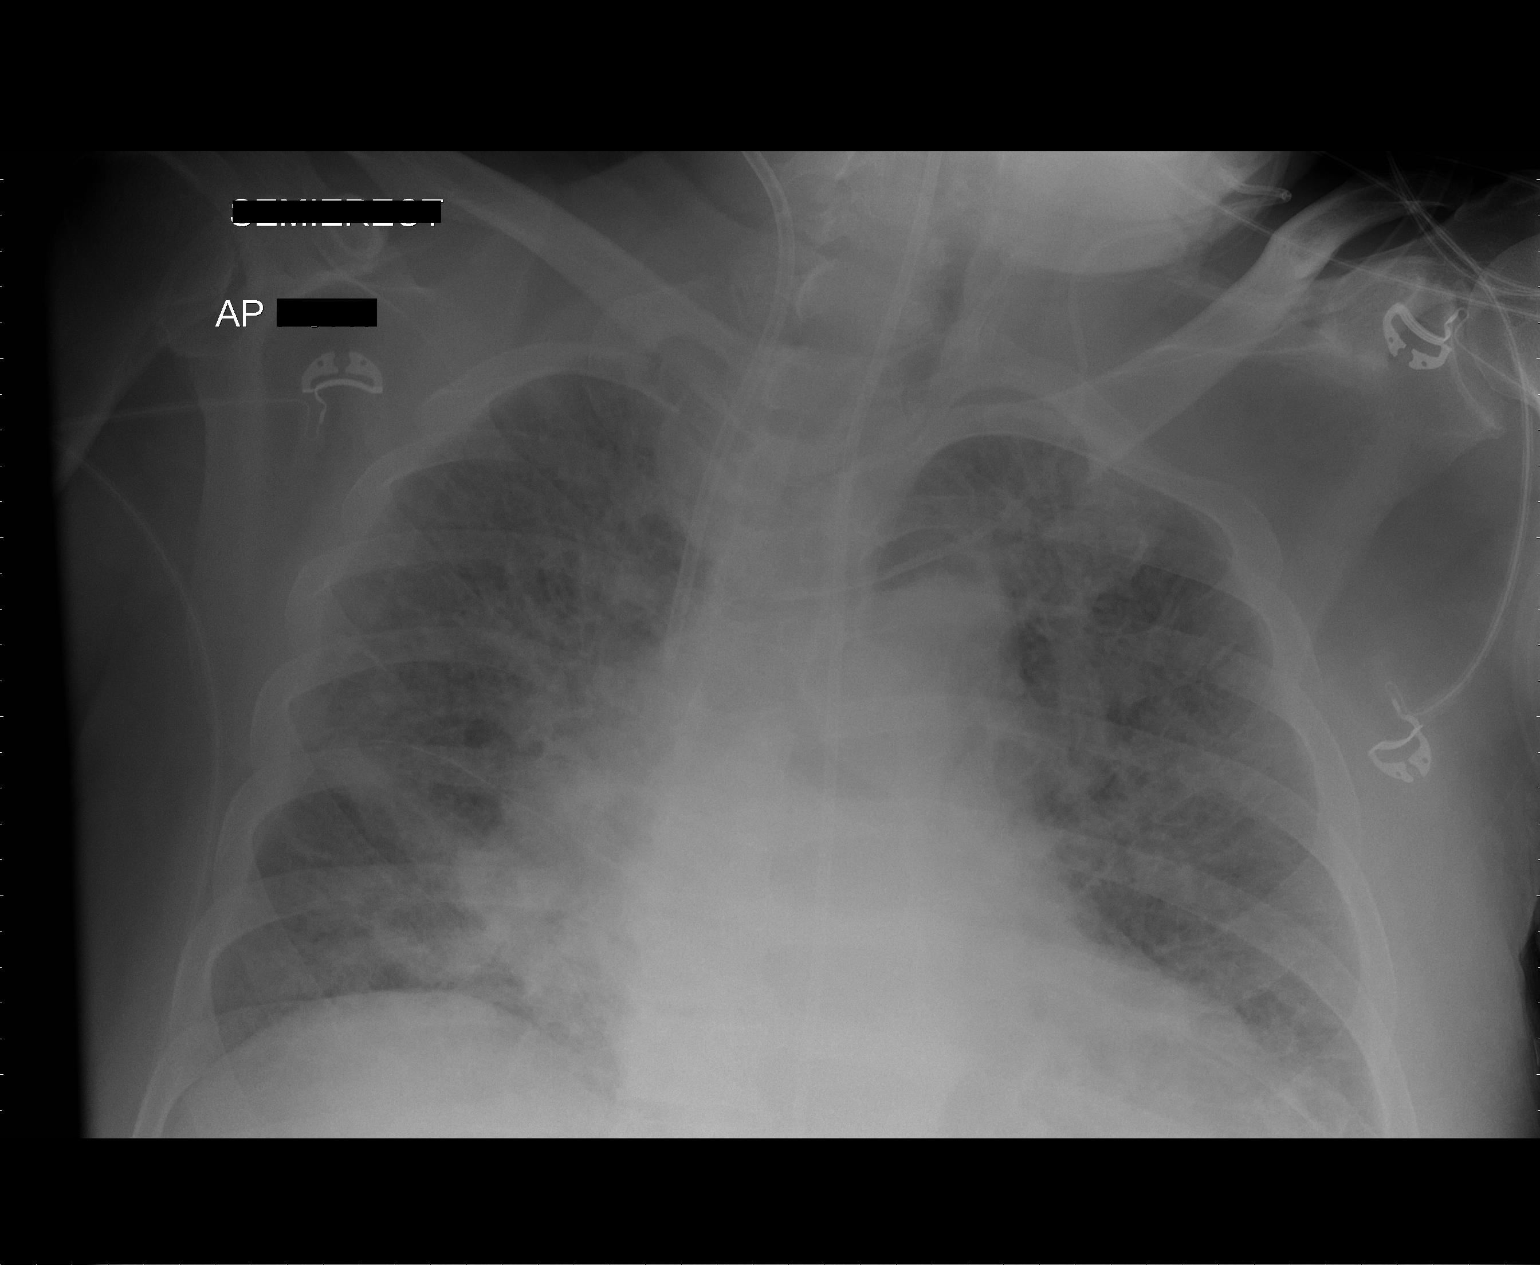

[1 of 1 positions shown; findings below may reference images not displayed]

FINDINGS: 3624 hours.  The feeding tube and bilateral central lines
remain in place.  The feeding tube tip is now below the diaphragm
and not visualized.  There are persistent low lung volumes with
little change in the bibasilar air space opacities.  There are
worsening generalized interstitial opacities in both lungs
suspicious for edema.  No pneumothorax or significant pleural
effusion is identified.  Heart size appears stable.
IMPRESSION: Interval development of diffuse interstitial opacity suspicious for
edema superimposed on underlying basilar air space opacities.

## 2012-04-05 IMAGING — CR DG ABD PORTABLE 1V
1 series · 6 of 6 positions shown · non-contrast
Comparison: Prior studies 09/18/2010.

CLINICAL DATA: Panda placement.

ABDOMEN - 1 VIEW

[Series 2: AP · 0.16mm/px · 6 of 6 slices shown]
[im 1/6]
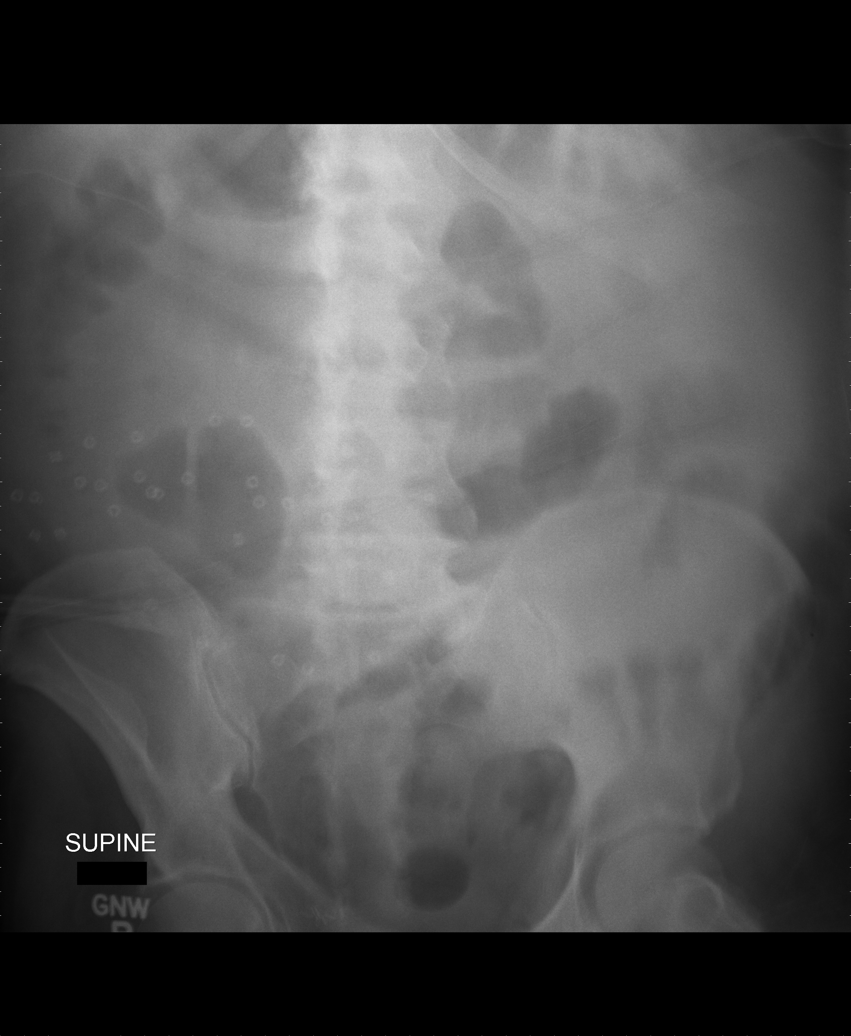
[im 2/6]
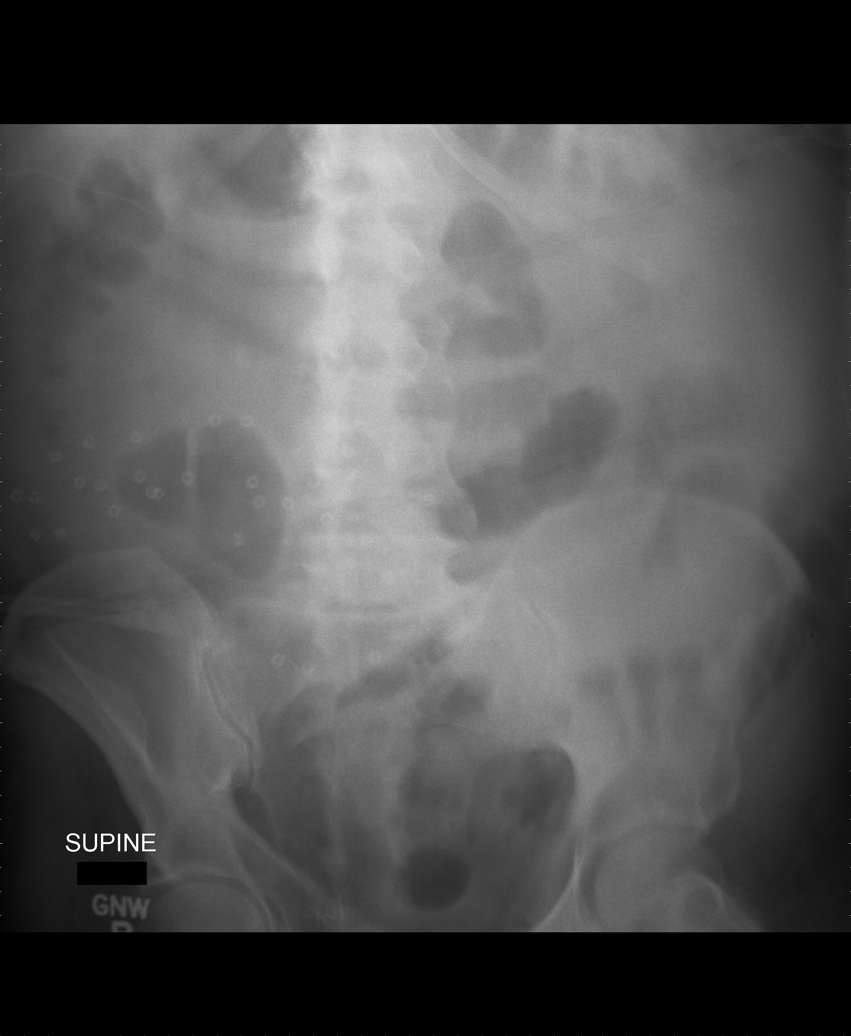
[im 3/6]
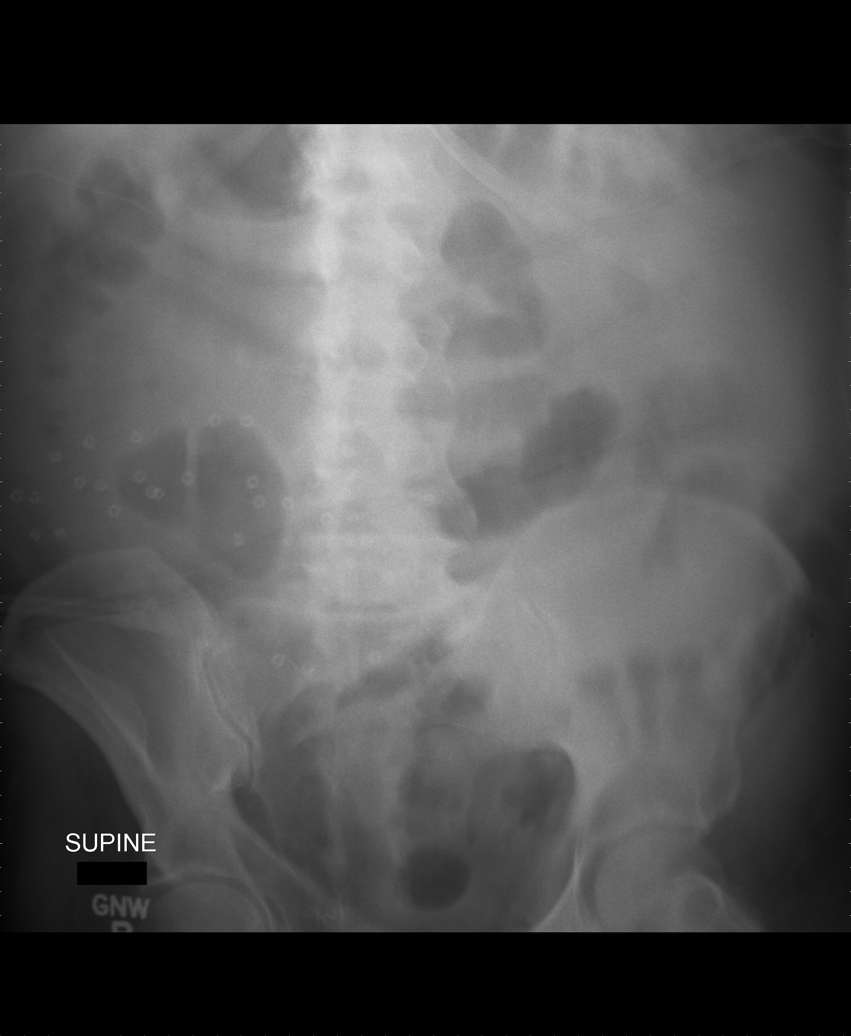
[im 4/6]
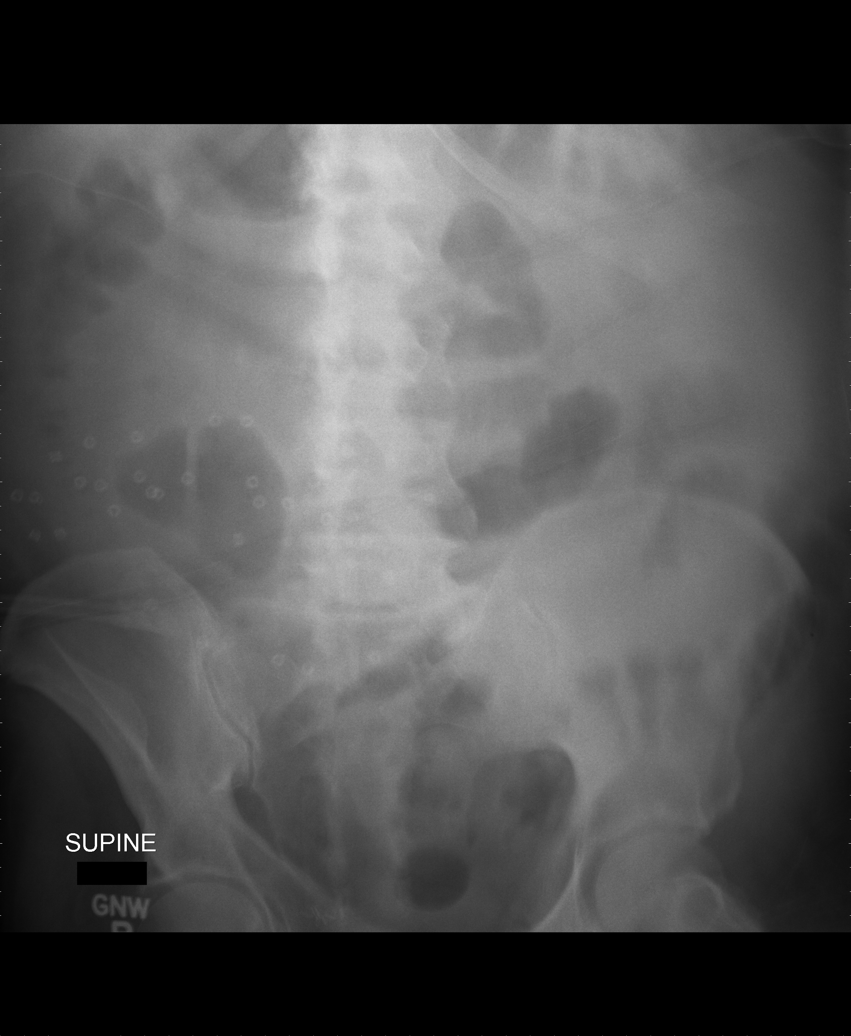
[im 5/6]
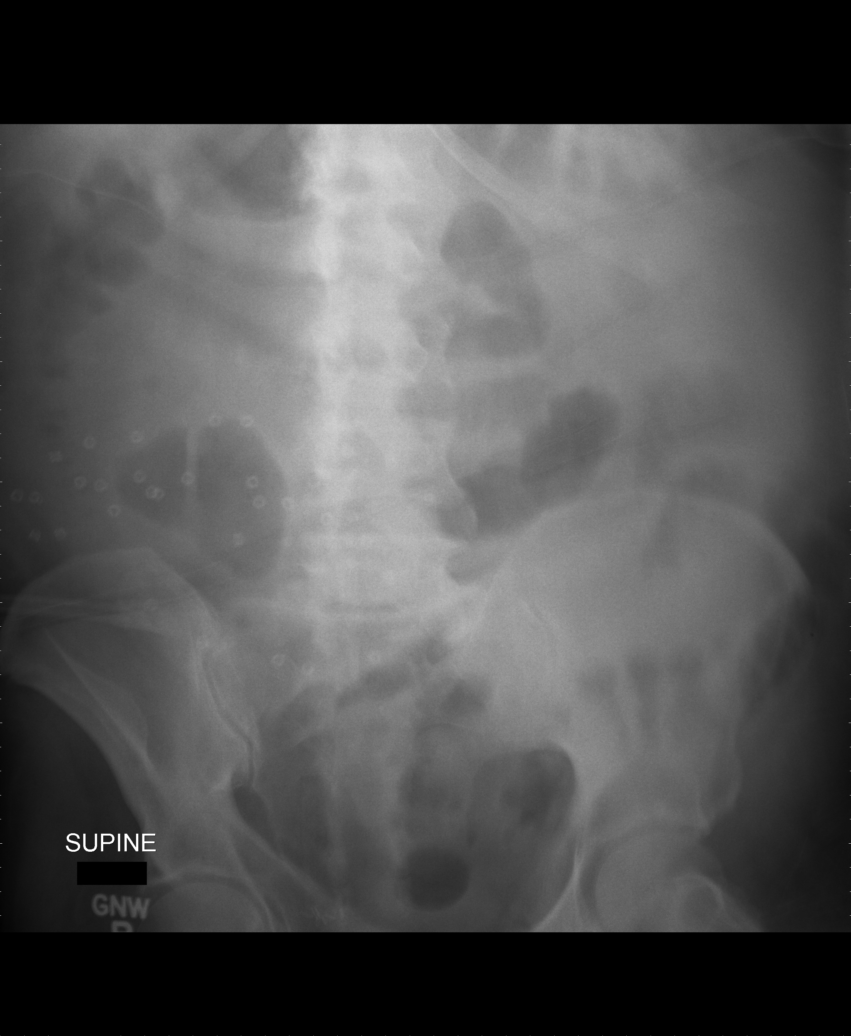
[im 6/6]
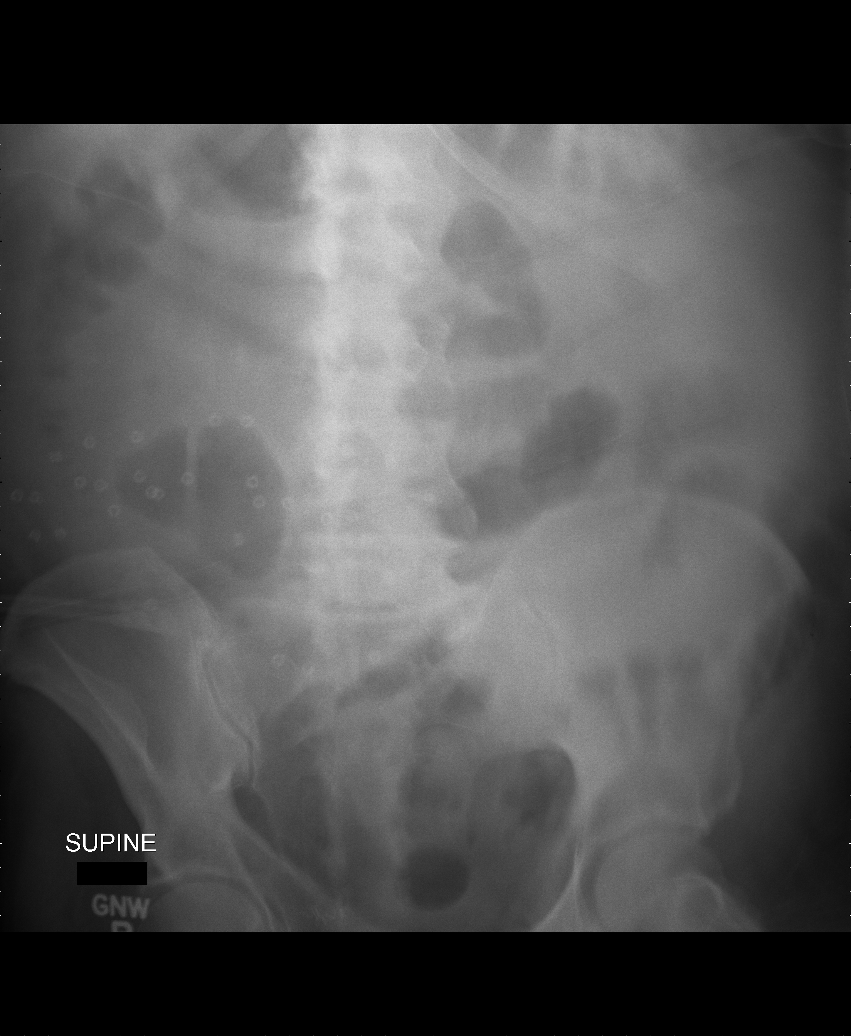

[6 of 6 positions shown; findings below may reference images not displayed]

FINDINGS: A Panda tube is not visualized on abdominal film.  This
could be in the distal esophagus.  A chest x-ray or withdrawal and
repeat placement are suggested.
IMPRESSION: Panda tube is not visualized on the abdominal radiograph.

## 2012-04-05 IMAGING — CR DG CHEST 1V PORT
1 series · 1 of 1 positions shown · non-contrast
Comparison: 09/20/2010.

CLINICAL DATA: Respiratory failure.  Evaluate feeding tube.

PORTABLE CHEST - 1 VIEW

[AP]
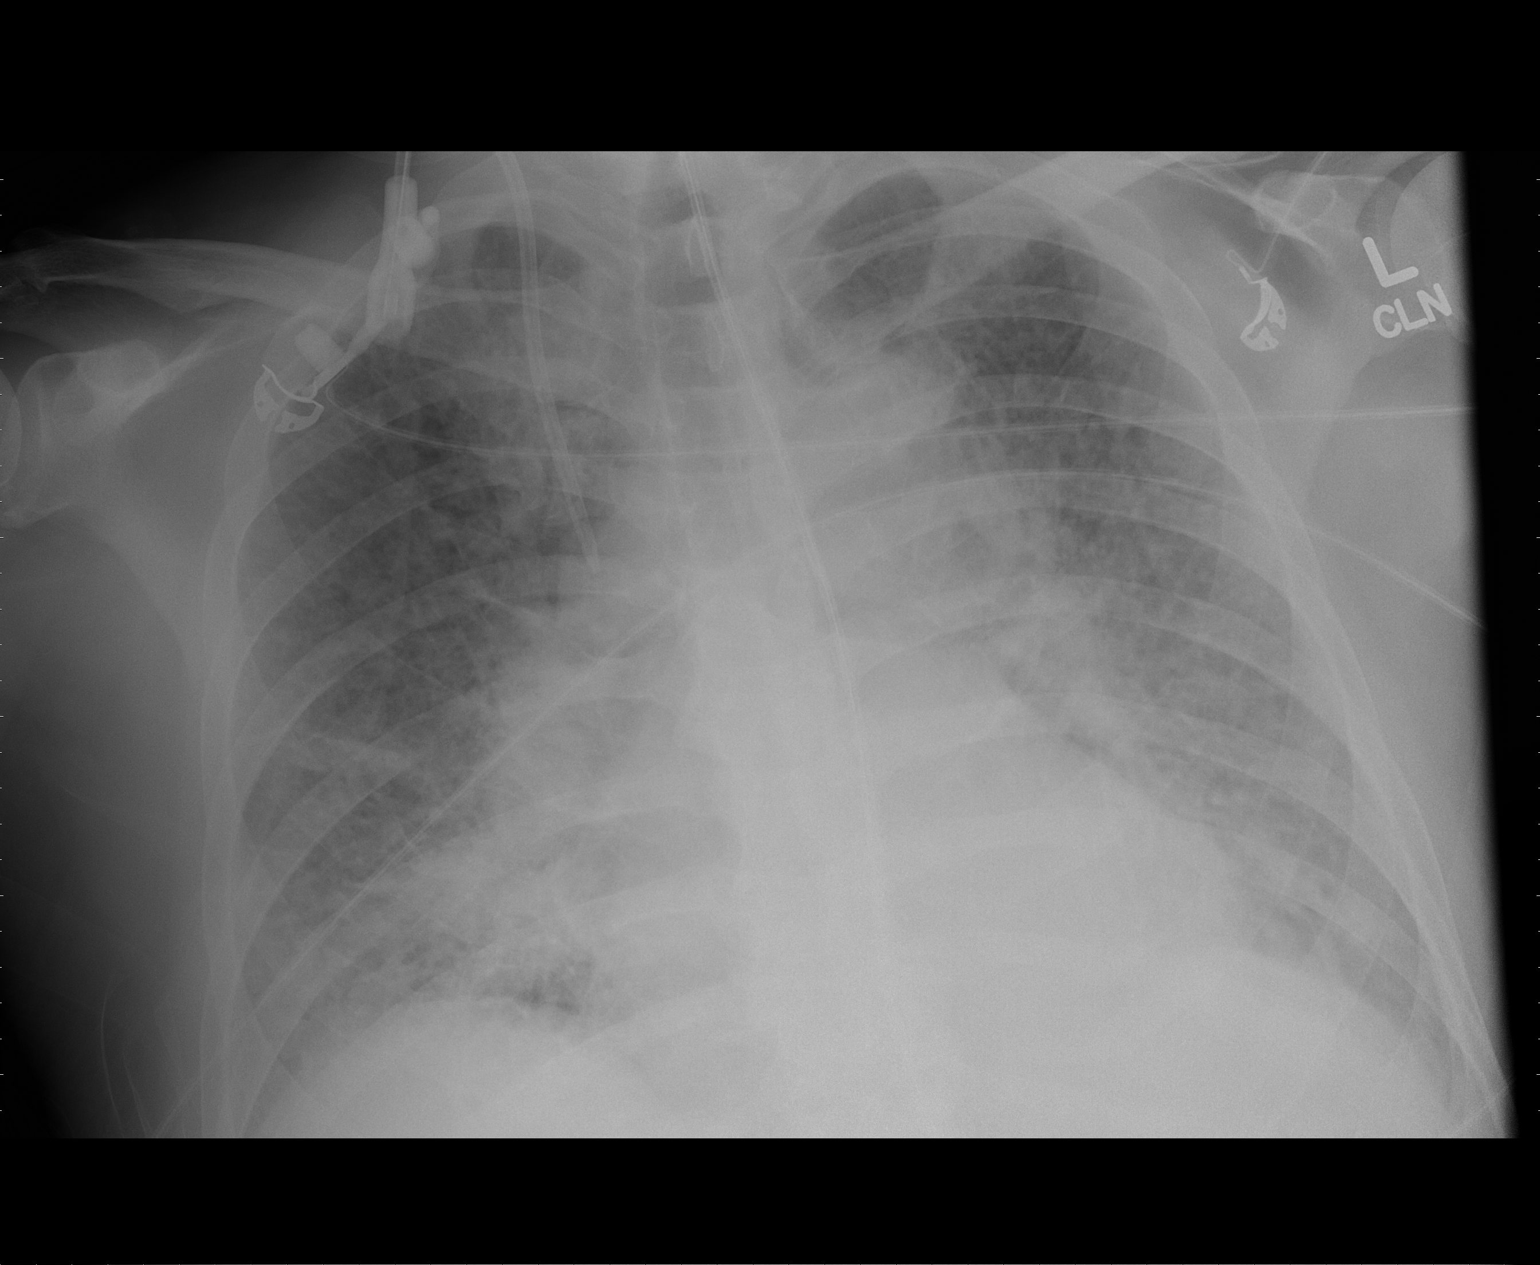

[1 of 1 positions shown; findings below may reference images not displayed]

FINDINGS: Support apparatus consists of a large bore right IJ
vascular line and a feeding tube.  The feeding tube tip is not
visualized.

Interval removal of left IJ central line.

Diffuse bilateral perihilar and basilar predominant airspace
disease is unchanged when allowing for differences in technique.
IMPRESSION: 1.  Unchanged right IJ vas cath.
2.  Interval removal of left IJ central line.
3.  Nasogastric tube in the esophagus with the tip not visualized.
4.  Unchanged pulmonary aeration.

## 2012-05-05 IMAGING — CR DG CHEST 1V PORT
1 series · 1 of 1 positions shown · non-contrast
Comparison: 10/22/2010

CLINICAL DATA: status post intubation

PORTABLE CHEST - 1 VIEW

[view not recorded]
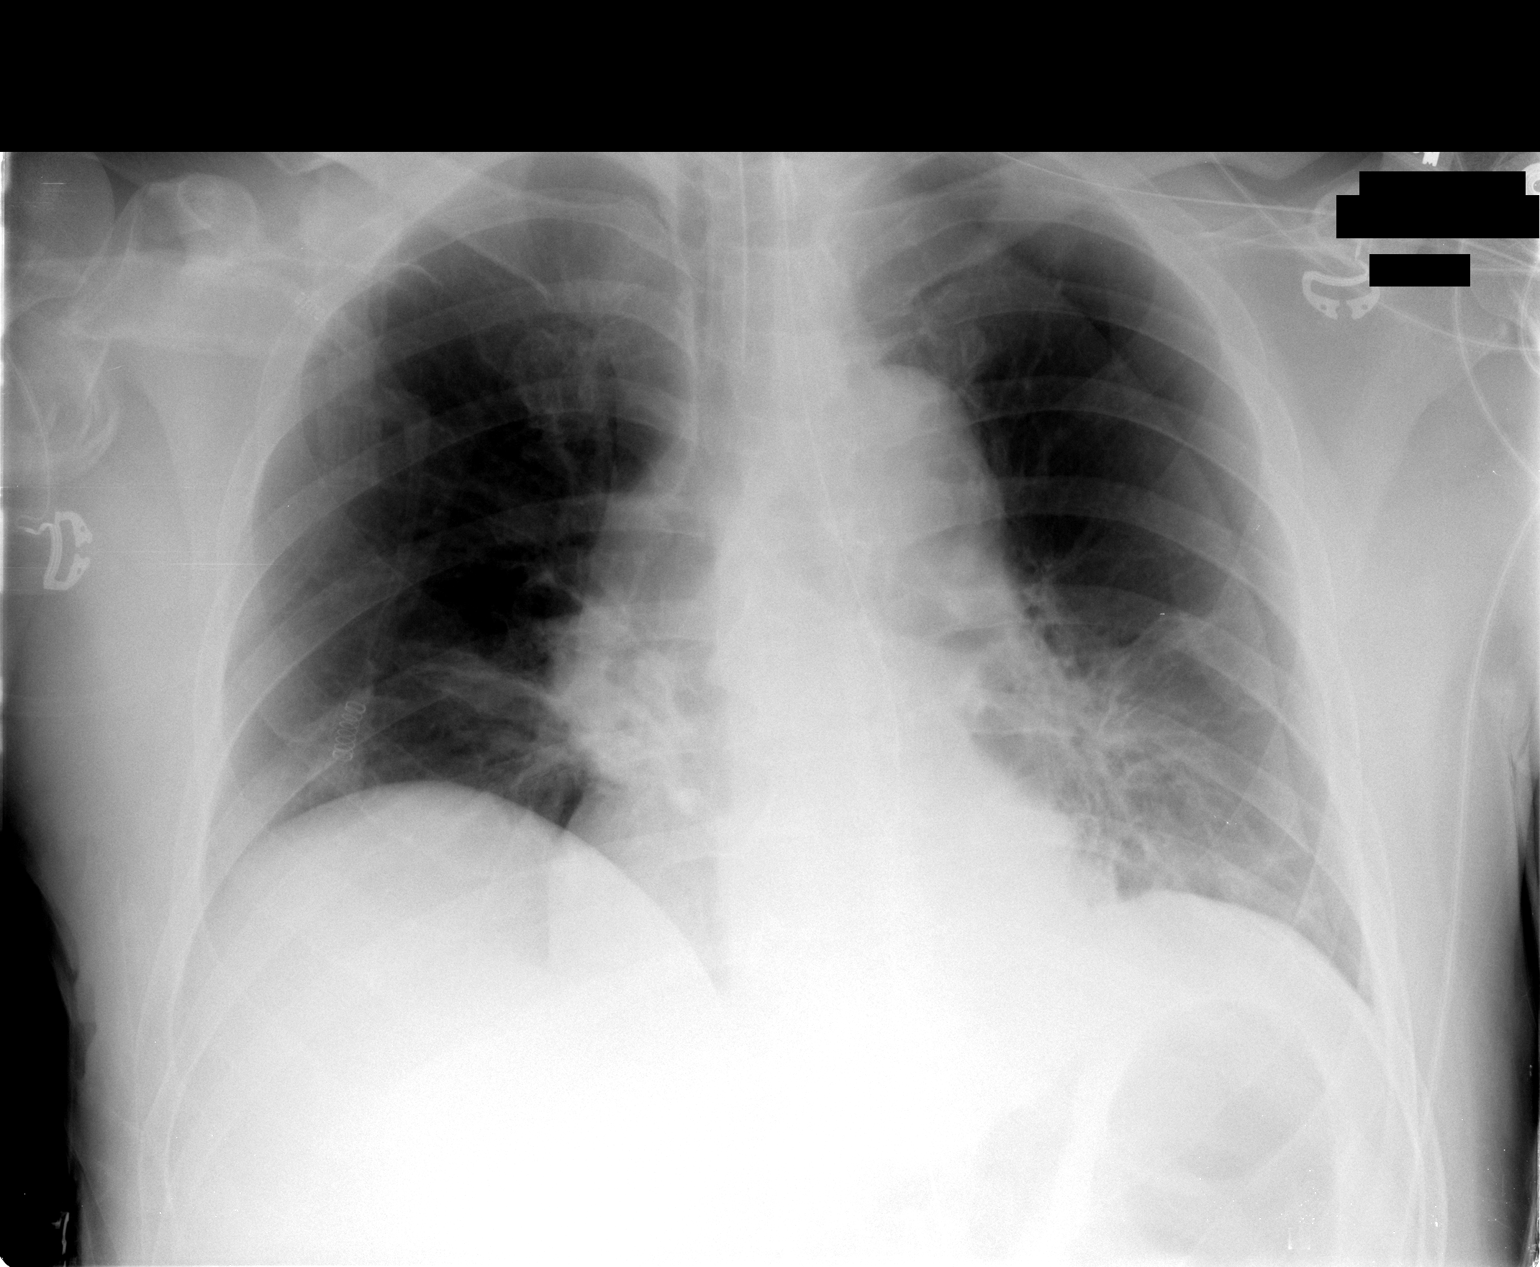

[1 of 1 positions shown; findings below may reference images not displayed]

FINDINGS: ET tube tip is now above the carina. There is a
nasogastric tube with tip below the field of view.  Heart size is
normal.  There are patchy areas of atelectasis within both lung
bases.  There is a crescent shaped lucent interface along the
lateral aspect of the left lung. While this may represent a skin
fold artifact I cannot rule out a pneumothorax.  Right side down
lateral decubitus radiograph is recommended.
IMPRESSION: 1.  Cannot rule out left-sided pneumothorax.  Recommend further
imaging with a right-sided down lateral decubitus radiograph of the
chest.

Critical test results telephoned to Nurse Franciisca Pate at the time
of interpretation on 10/22/2010at [DATE]

## 2012-05-05 IMAGING — CT CT ABD-PELV W/ CM
2 of 5 series · 17 of 46 positions shown, 19 images · IV contrast (APPLIED)
Comparison: 09/14/2009

CLINICAL DATA: Abdominal pain

CT ABDOMEN AND PELVIS WITH CONTRAST
TECHNIQUE: Multidetector CT imaging of the abdomen and pelvis was
performed following the standard protocol during bolus
administration of intravenous contrast.
Contrast: 100 ml 9mnipaque-VSS

[Series 2: abd/pelv with 5.0 b31f st · axial · 0.79mm/px · z∈[-504,-28]mm · 14 of 107 slices shown, 16 images]
[im 6/107  soft-tissue]
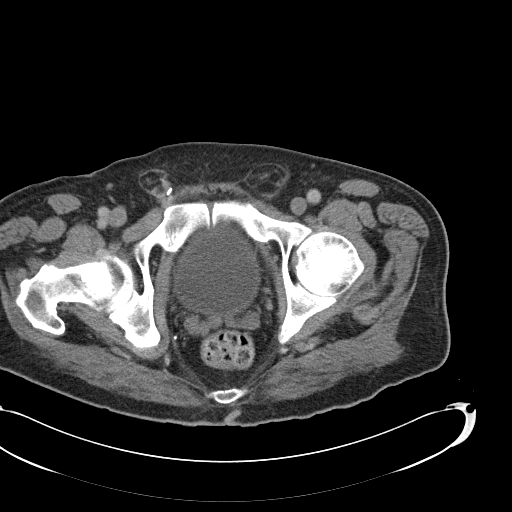
[im 6/107  bone]
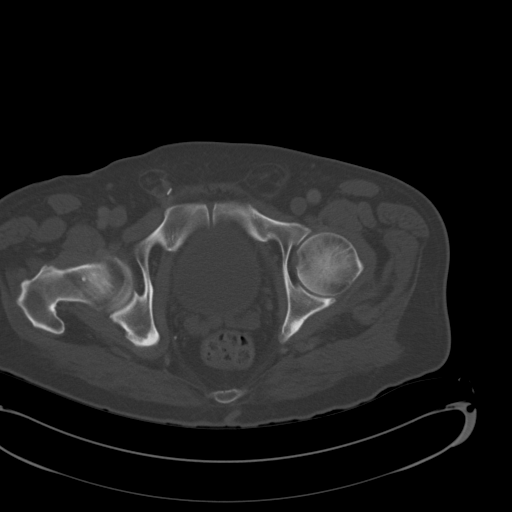
[im 12/107  soft-tissue]
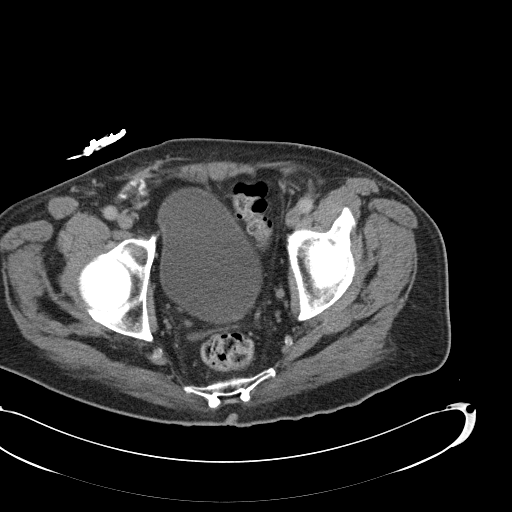
[im 23/107  soft-tissue]
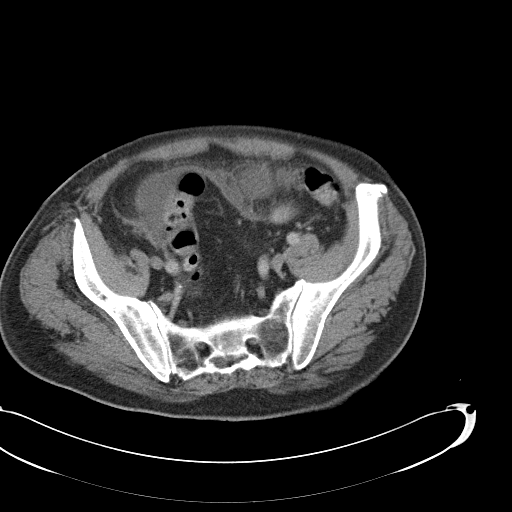
[im 28/107  soft-tissue]
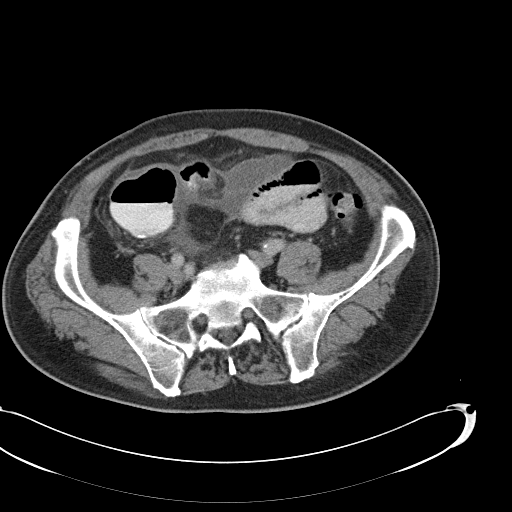
[im 34/107  soft-tissue]
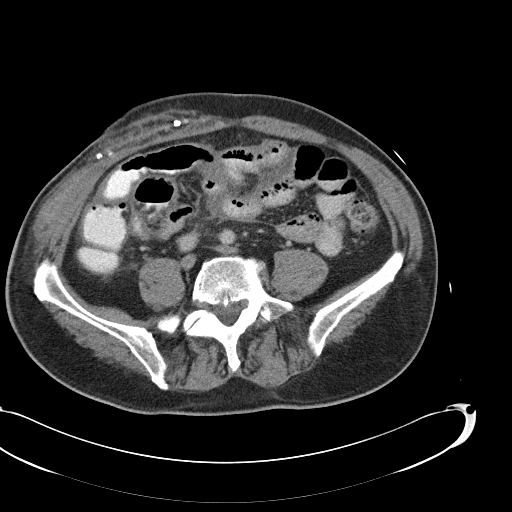
[im 45/107  soft-tissue]
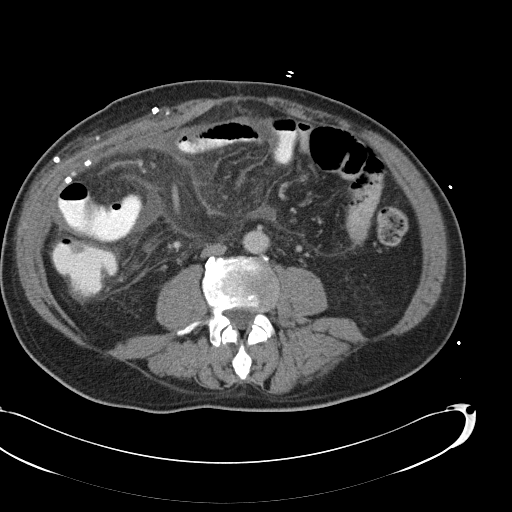
[im 51/107  soft-tissue]
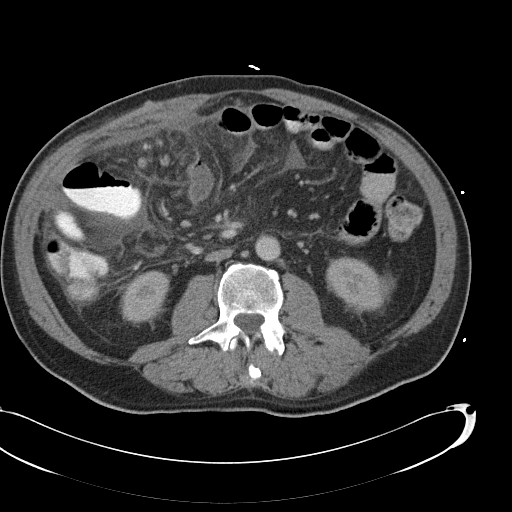
[im 56/107  soft-tissue]
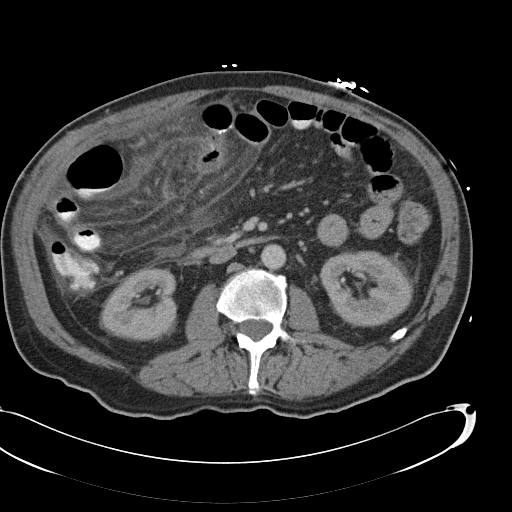
[im 62/107  soft-tissue]
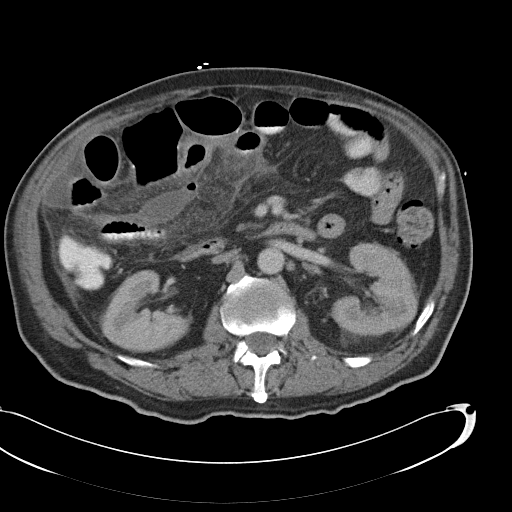
[im 62/107  bone]
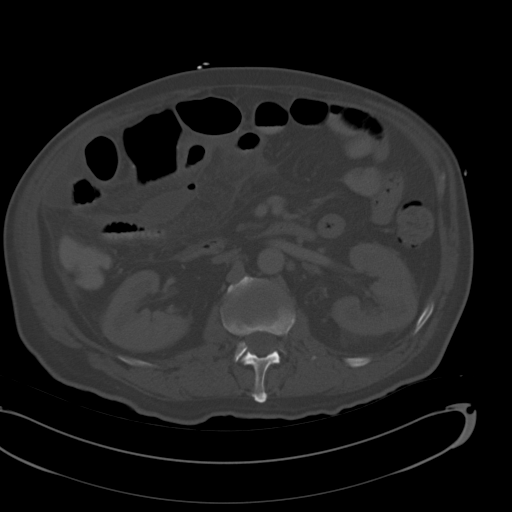
[im 73/107  soft-tissue]
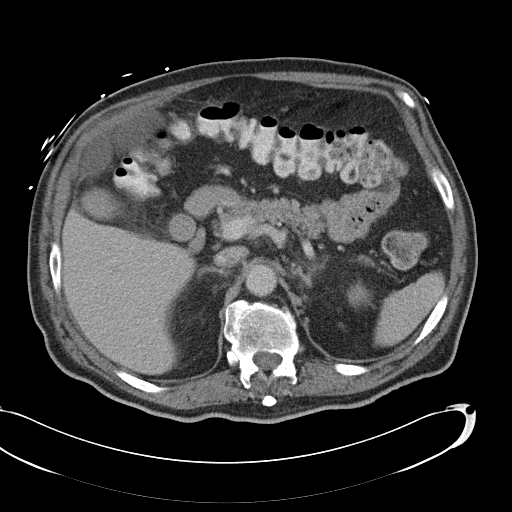
[im 79/107  soft-tissue]
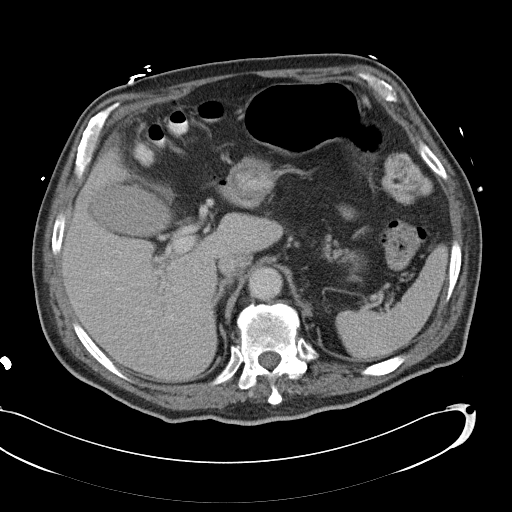
[im 84/107  soft-tissue]
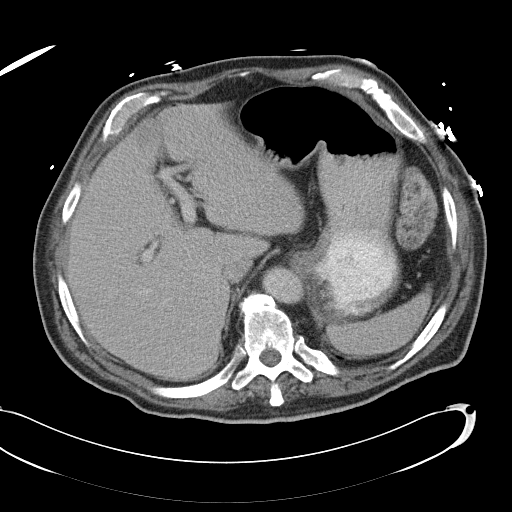
[im 95/107  soft-tissue]
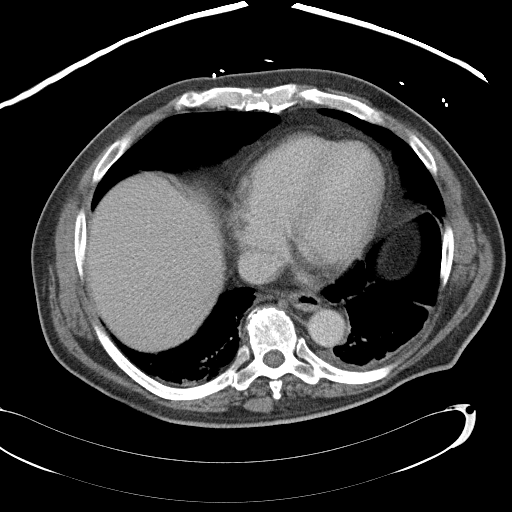
[im 101/107  soft-tissue]
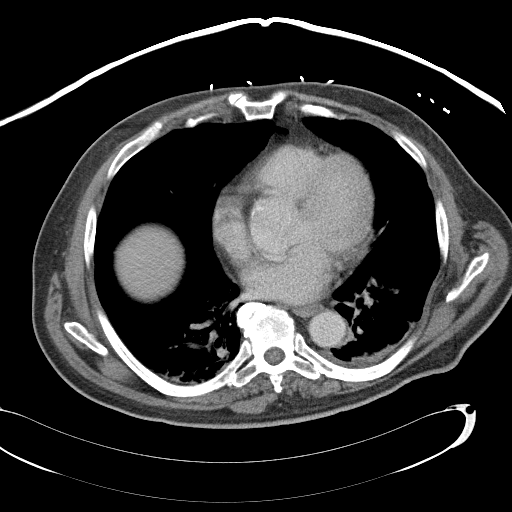

[Series 602: coronals · coronal · 1.05mm/px · 3 of 138 slices shown]
[im 46/138  soft-tissue]
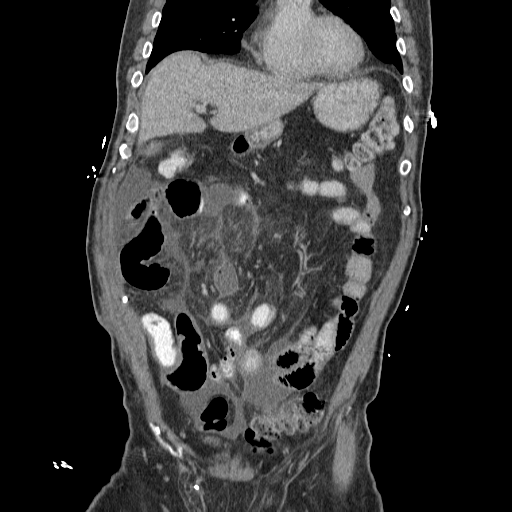
[im 61/138  soft-tissue]
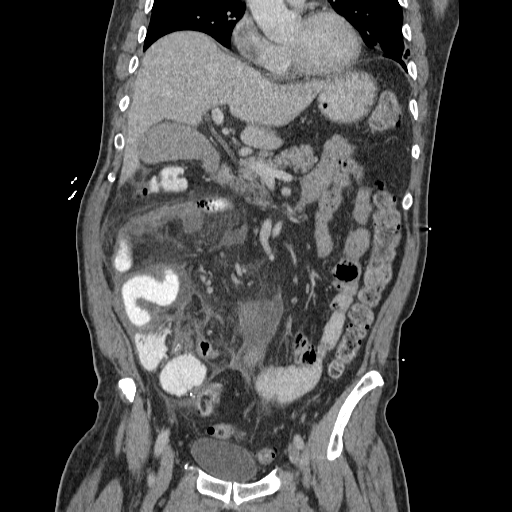
[im 77/138  soft-tissue]
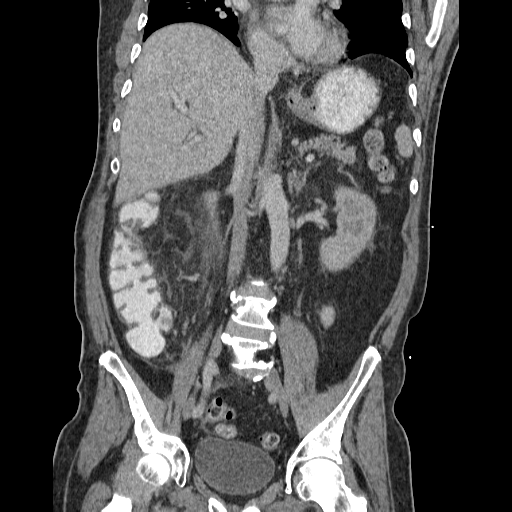

[17 of 46 positions shown; findings below may reference images not displayed]

FINDINGS: Small bowel to small bowel anastomosis in the right upper
quadrant is noted.  Several fluid collections are now seen in this
vicinity.  Some contain gas worrisome for abscess or bowel leak.
2.3 x 4.1 cm fluid collection in the right upper quadrant mesentery
on image 48.  3.2 x 2.8 cm fluid collection on image 42.  4.7 x
cm fluid collection on image 60 containing gas and more centrally
located.  Other scattered areas of loculated fluid are seen
inferiorly.  Free fluid in the right upper quadrant anterior to the
operative site is also present.

Bibasilar predominately linear atelectasis.

Hyperdense material in the gallbladder is present which may
represent sludge.

Free intraperitoneal gas is seen anterior to the liver and in the
region of the round ligament.

Liver parenchyma, spleen, pancreas, adrenal glands are within
normal limits.

Postoperative changes from right abdominal hernia repair are noted.
Hernia repair in the right inguinal region is also present.

The patient was inadvertently given 100 ml 9mnipaque-VSS prior to
determination of the renal function.  Findings were discussed with
Dr. Massahiro.  Urgent renal consult will be performed.
IMPRESSION: Multiple right upper quadrant abscesses, some containing gas, and
areas of free intraperitoneal gas.  These findings are worrisome
for postoperative abscess formation or anastomotic bowel leak.

## 2012-05-07 IMAGING — CR DG CHEST 1V PORT
1 series · 1 of 1 positions shown · non-contrast
Comparison: 10/23/2010

CLINICAL DATA: Diabetes.  Hypertension.  Status post exploratory
laparotomy for perforated small bowel anastomosis, with lysis of
adhesions.  Abdominal pain.

PORTABLE CHEST - 1 VIEW

[view not recorded]
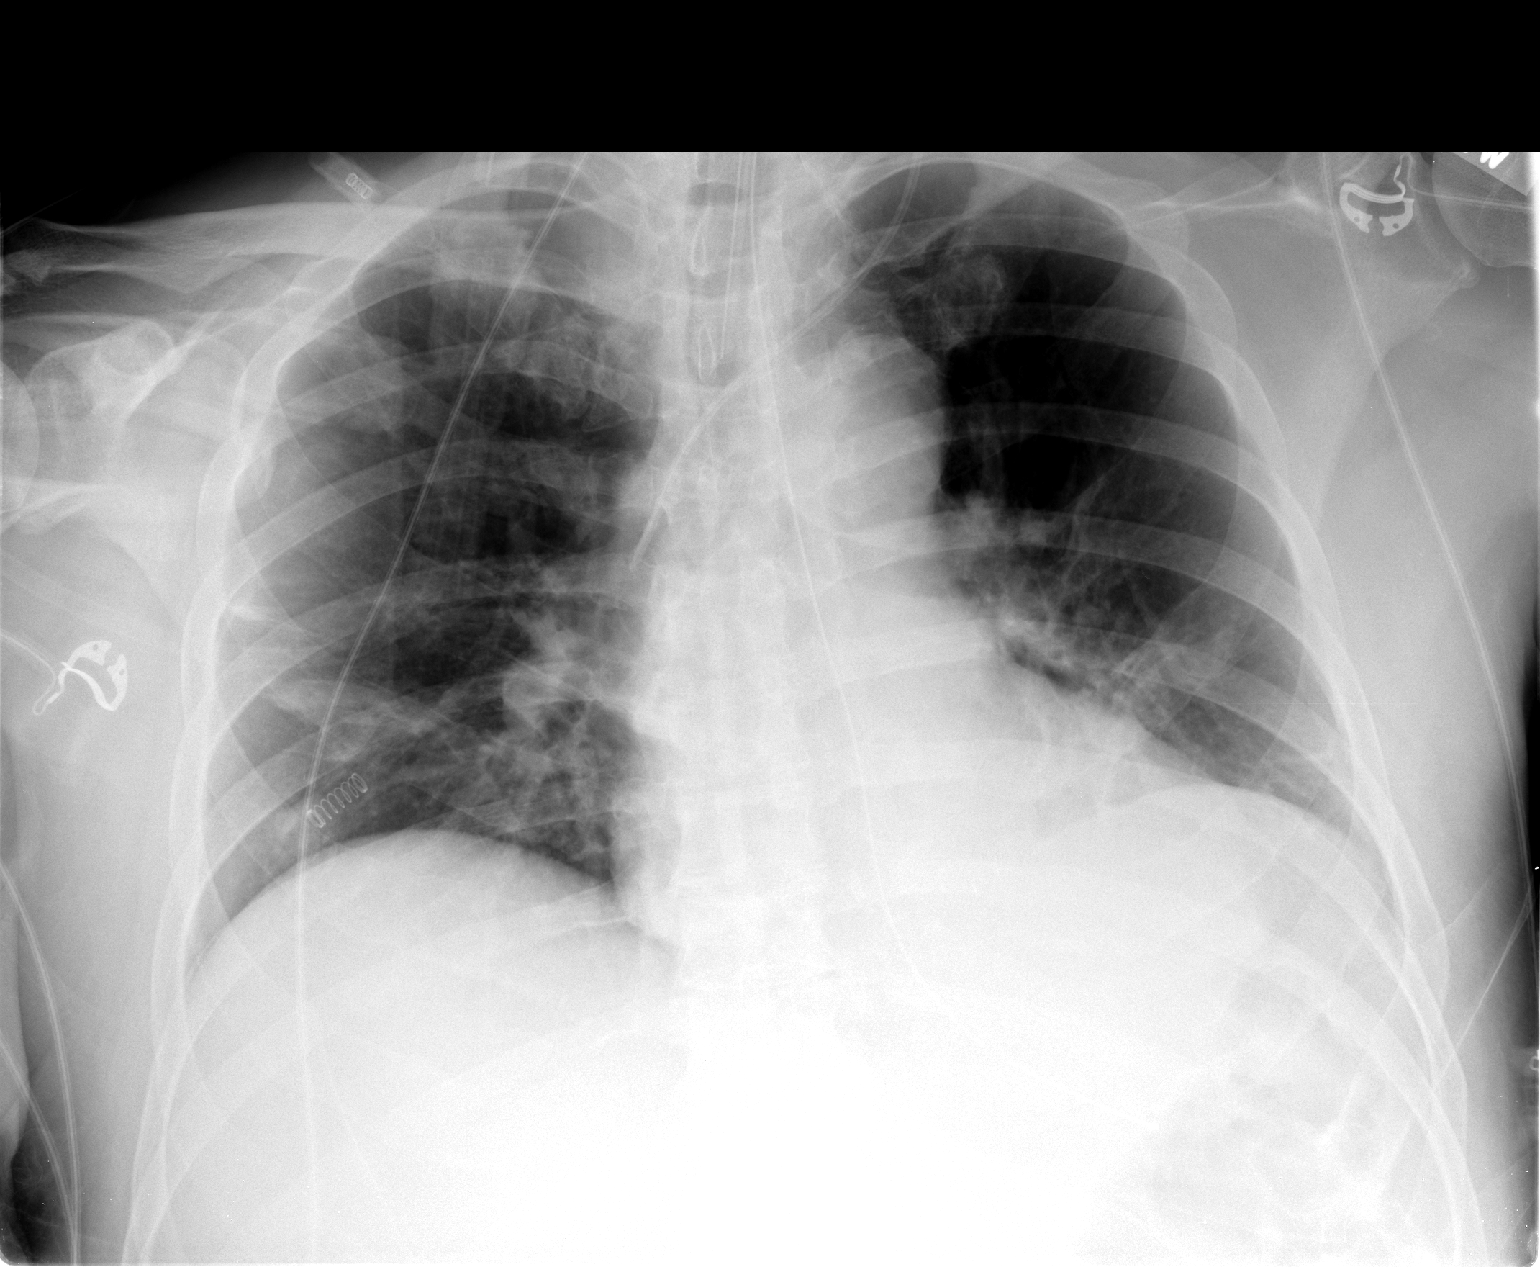

[1 of 1 positions shown; findings below may reference images not displayed]

FINDINGS: Endotracheal tube tip is 4.7 cm above the carina.

Left IJ line tip:  SVC.

Nasogastric tube tip:  Stomach body.

Mildly increased bibasilar and right mid lung linear opacities are
compatible with mild atelectasis.  Low lung volumes noted.  Heart
size is within normal limits.
IMPRESSION: 1.  Slightly increased perihilar and basilar subsegmental
atelectasis.
2.  Tubes and lines appears satisfactorily positioned.
3.  Low lung volumes.

## 2012-05-13 IMAGING — CT CT ABD-PELV W/O CM
2 of 4 series · 17 of 46 positions shown, 19 images · non-contrast
Comparison: 10/22/2010

CLINICAL DATA: Abdominal pain.  Status post exploratory laparotomy
with drain placement and lysis of adhesions.  Postop ileus.

CT ABDOMEN AND PELVIS WITHOUT CONTRAST
TECHNIQUE: Multidetector CT imaging of the abdomen and pelvis was
performed following the standard protocol without intravenous
contrast.

[Series 2: abd/pelv w/o 5.0 b31f st · axial · non-contrast · 0.82mm/px · z∈[-437,+13]mm · 14 of 100 slices shown, 16 images]
[im 5/100  soft-tissue]
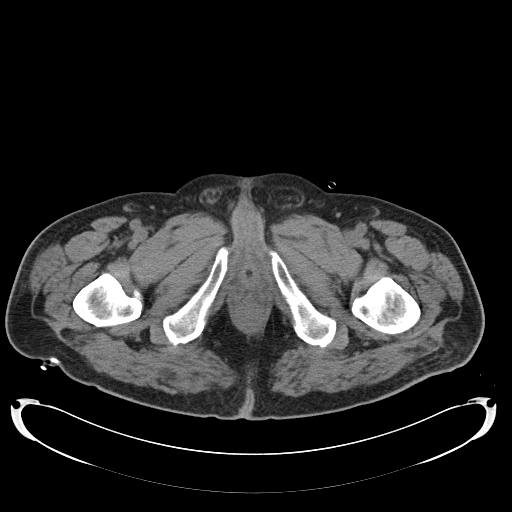
[im 5/100  bone]
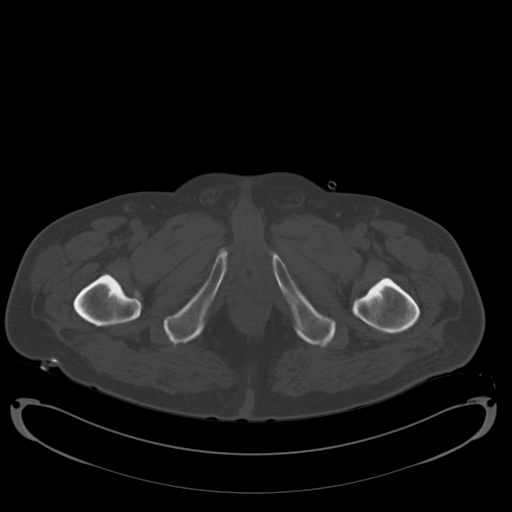
[im 14/100  soft-tissue]
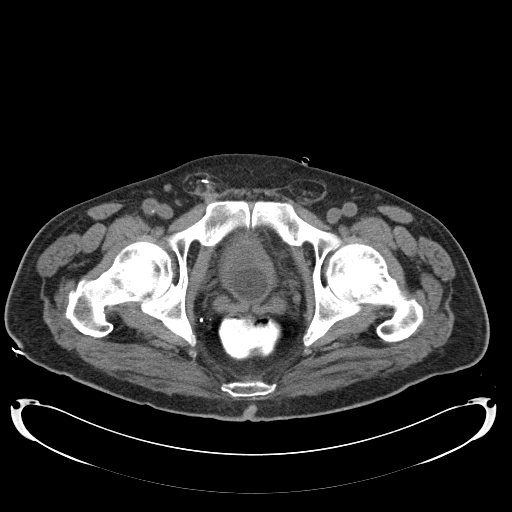
[im 19/100  soft-tissue]
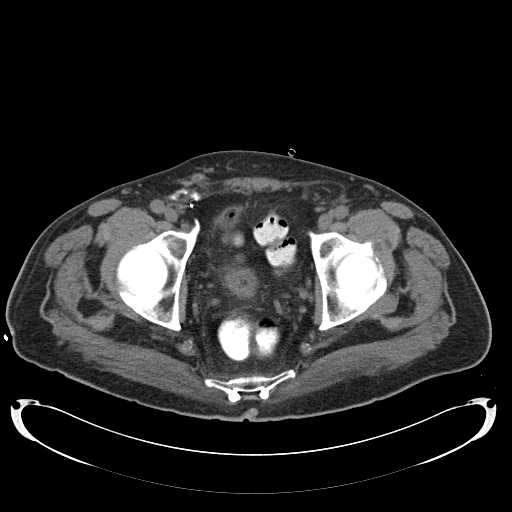
[im 28/100  soft-tissue]
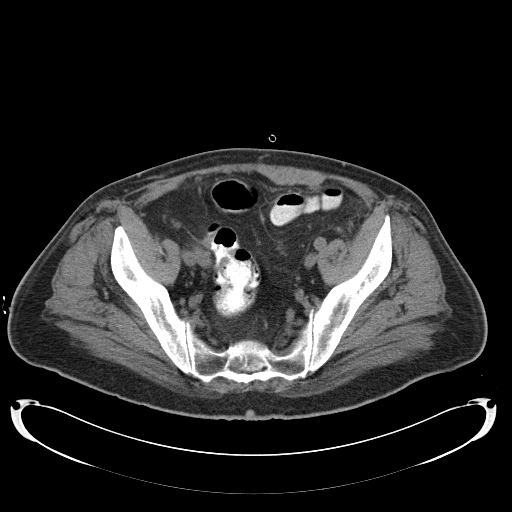
[im 32/100  soft-tissue]
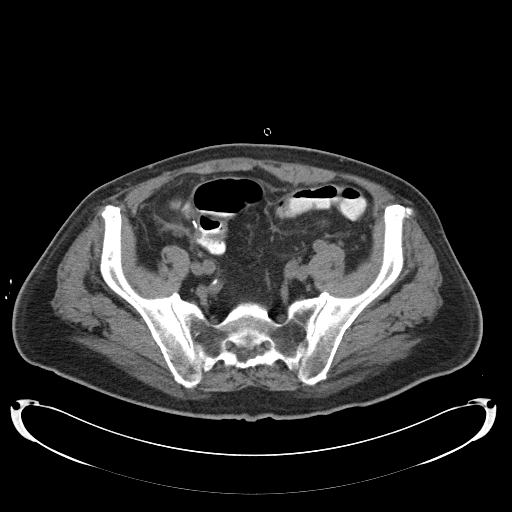
[im 41/100  soft-tissue]
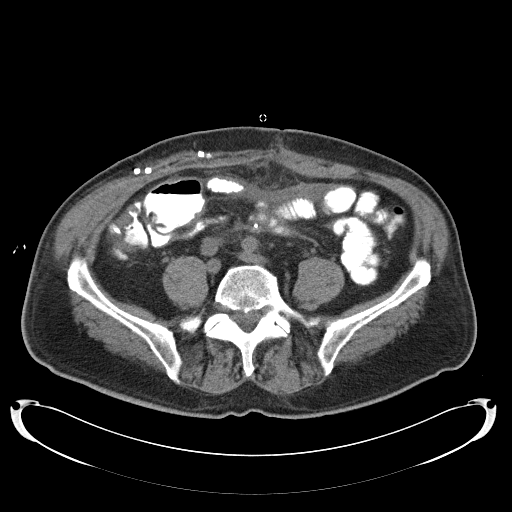
[im 46/100  soft-tissue]
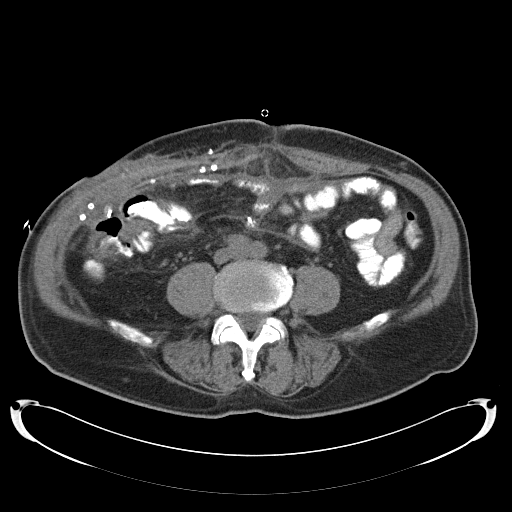
[im 55/100  soft-tissue]
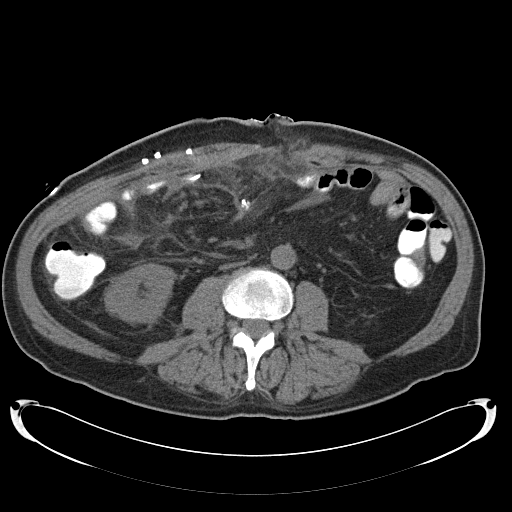
[im 59/100  soft-tissue]
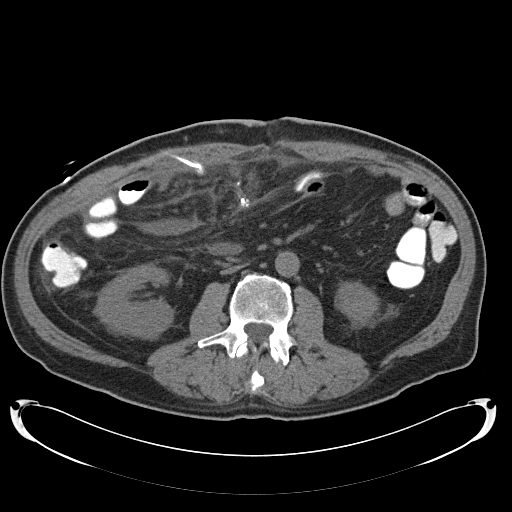
[im 59/100  bone]
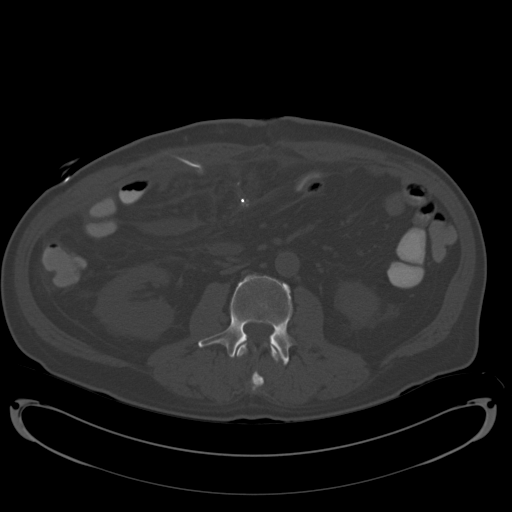
[im 68/100  soft-tissue]
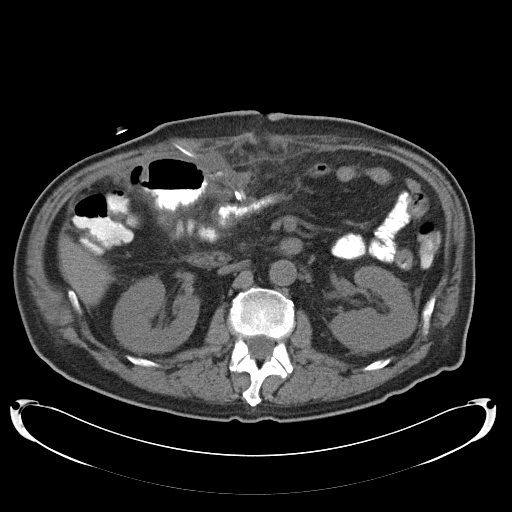
[im 73/100  soft-tissue]
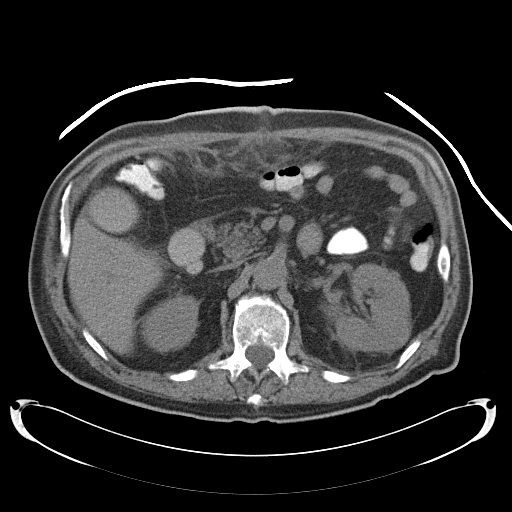
[im 82/100  soft-tissue]
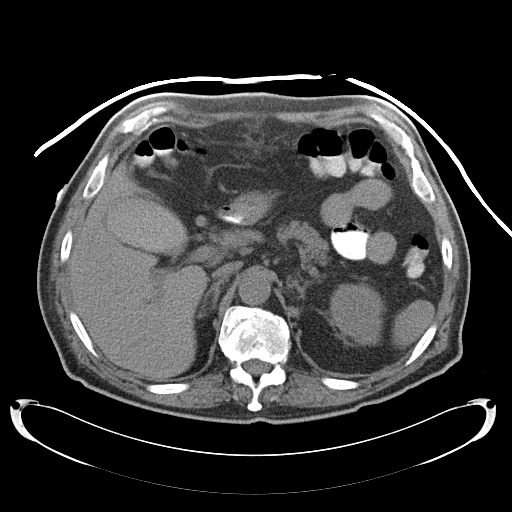
[im 86/100  soft-tissue]
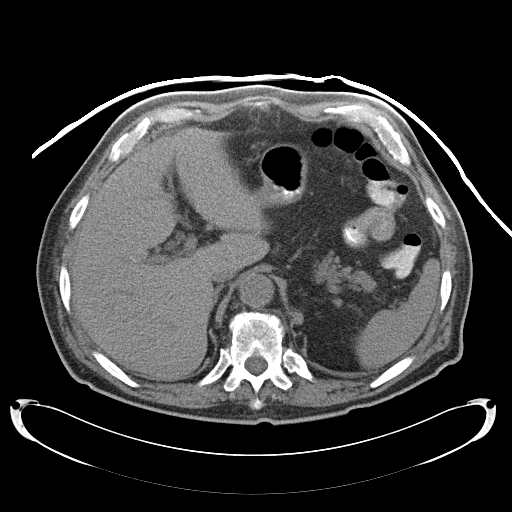
[im 95/100  soft-tissue]
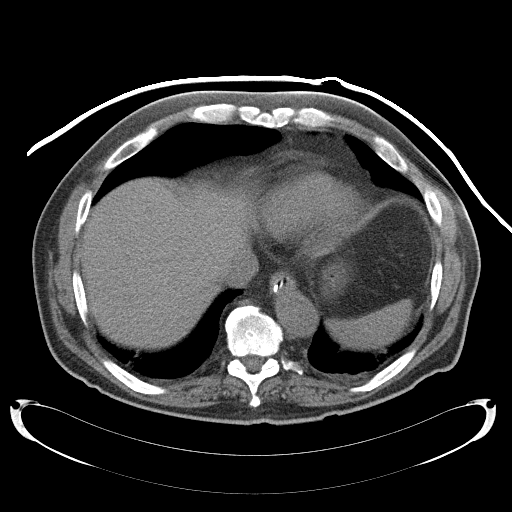

[Series 602: coronals · coronal · 0.97mm/px · 3 of 124 slices shown]
[im 42/124  soft-tissue]
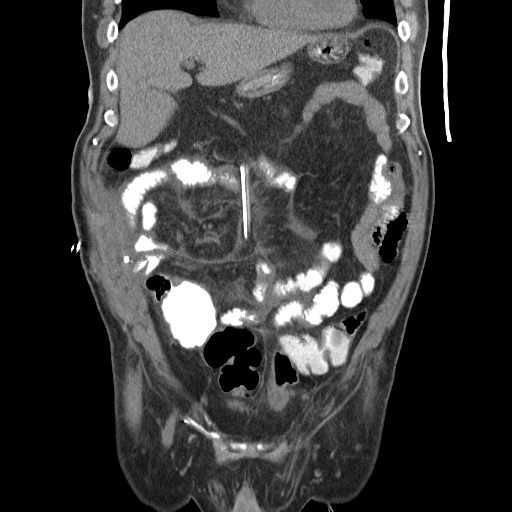
[im 55/124  soft-tissue]
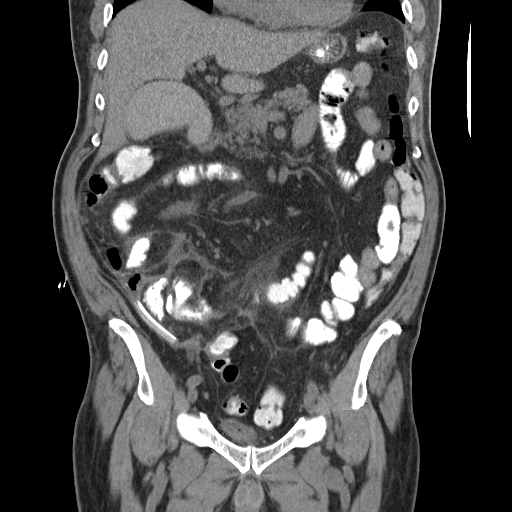
[im 69/124  soft-tissue]
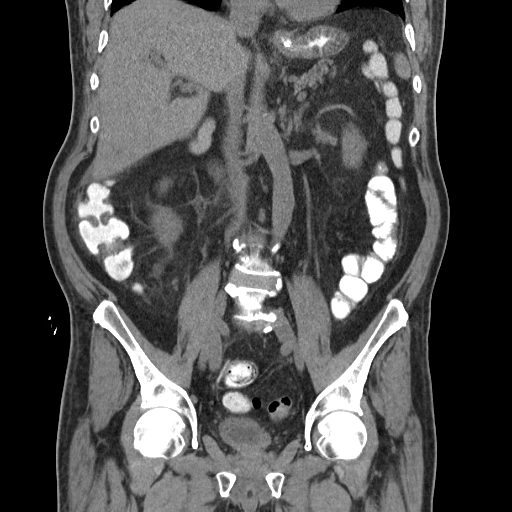

[17 of 46 positions shown; findings below may reference images not displayed]

FINDINGS: Lung bases show trace left pleural fluid or pleural
thickening.  Scattered atelectasis and/or scarring.  Heart size
normal.  No pericardial effusion.

Liver is unremarkable.  High attenuation in the gallbladder likely
represents vicarious excretion of contrast.  Kidneys, spleen and
pancreas are unremarkable.  Nasogastric tube terminates in the
stomach, which is fairly decompressed.  Small bowel and colon are
unremarkable.  Percutaneous drainage catheters have been placed in
the right abdomen in the interval.  There has been some improvement
in a focal fluid collection in the right abdomen, measuring 1.7 x
4.4 cm (previously 2.3 x 4.1 cm).  An interloop abscess in the
adjacent small bowel (image 35) is also decreased in size in the
interval, now measuring approximately 1.6 x 1.7 cm (previously
x 2.8 cm). Stranding in the right abdomen and interloop fluid in
the pelvis (e.g., images 60 and 79) have all improved in the
interval as well.

Postoperative changes are seen along the right lower abdominal and
pelvic wall.  There are ventral incisional hernias which contain
fat.  Postoperative changes are seen along the right inguinal wall.
Foley catheter and air are seen in the bladder.  No worrisome lytic
or sclerotic lesions.
IMPRESSION: Interval improvement in interloop abscesses and fluid after
placement of two percutaneous drains.

## 2019-07-20 ENCOUNTER — Ambulatory Visit: Payer: Self-pay | Attending: Internal Medicine

## 2019-08-21 ENCOUNTER — Other Ambulatory Visit: Payer: Self-pay

## 2019-08-21 ENCOUNTER — Encounter (HOSPITAL_COMMUNITY): Payer: Self-pay | Admitting: Emergency Medicine

## 2019-08-21 ENCOUNTER — Emergency Department (HOSPITAL_COMMUNITY)
Admission: EM | Admit: 2019-08-21 | Discharge: 2019-08-21 | Disposition: A | Discharge status: Home / Self Care | Payer: Self-pay | Attending: Emergency Medicine | Admitting: Emergency Medicine

## 2019-08-21 ENCOUNTER — Emergency Department (HOSPITAL_COMMUNITY): Payer: Self-pay

## 2019-08-21 DIAGNOSIS — I129 Hypertensive chronic kidney disease with stage 1 through stage 4 chronic kidney disease, or unspecified chronic kidney disease: Secondary | ICD-10-CM | POA: Insufficient documentation

## 2019-08-21 DIAGNOSIS — Z7982 Long term (current) use of aspirin: Secondary | ICD-10-CM | POA: Insufficient documentation

## 2019-08-21 DIAGNOSIS — Z7984 Long term (current) use of oral hypoglycemic drugs: Secondary | ICD-10-CM | POA: Insufficient documentation

## 2019-08-21 DIAGNOSIS — Y999 Unspecified external cause status: Secondary | ICD-10-CM | POA: Insufficient documentation

## 2019-08-21 DIAGNOSIS — E1122 Type 2 diabetes mellitus with diabetic chronic kidney disease: Secondary | ICD-10-CM | POA: Insufficient documentation

## 2019-08-21 DIAGNOSIS — Z79899 Other long term (current) drug therapy: Secondary | ICD-10-CM | POA: Insufficient documentation

## 2019-08-21 DIAGNOSIS — N184 Chronic kidney disease, stage 4 (severe): Secondary | ICD-10-CM | POA: Insufficient documentation

## 2019-08-21 DIAGNOSIS — W109XXA Fall (on) (from) unspecified stairs and steps, initial encounter: Secondary | ICD-10-CM | POA: Insufficient documentation

## 2019-08-21 DIAGNOSIS — I4891 Unspecified atrial fibrillation: Secondary | ICD-10-CM | POA: Insufficient documentation

## 2019-08-21 DIAGNOSIS — Y929 Unspecified place or not applicable: Secondary | ICD-10-CM | POA: Insufficient documentation

## 2019-08-21 DIAGNOSIS — I10 Essential (primary) hypertension: Secondary | ICD-10-CM

## 2019-08-21 DIAGNOSIS — Y939 Activity, unspecified: Secondary | ICD-10-CM | POA: Insufficient documentation

## 2019-08-21 DIAGNOSIS — S62102A Fracture of unspecified carpal bone, left wrist, initial encounter for closed fracture: Secondary | ICD-10-CM | POA: Insufficient documentation

## 2019-08-21 MED ORDER — HYDROCODONE-ACETAMINOPHEN 5-325 MG PO TABS: ORAL_TABLET | ORAL | 0 refills | Status: DC

## 2019-08-21 MED ORDER — OXYCODONE-ACETAMINOPHEN 5-325 MG PO TABS
1.0000 | ORAL_TABLET | Freq: Once | ORAL | Status: AC
  Administered 2019-08-21: 09:00:00 1 via ORAL
  Filled 2019-08-21: qty 1

## 2019-08-21 MED ORDER — LISINOPRIL 5 MG PO TABS
5.0000 mg | ORAL_TABLET | Freq: Once | ORAL | Status: AC
  Administered 2019-08-21: 11:00:00 5 mg via ORAL
  Filled 2019-08-21: qty 1

## 2019-08-21 NOTE — ED Triage Notes (Signed)
Pt reports tripping and falling up stairs this morning and landing on left wrist.

## 2019-08-21 NOTE — Discharge Instructions (Addendum)
Elevate your left arm when possible.  Keep the splint in place until you see Dr. Aline Brochure.  You have an appointment on Monday morning at 11 AM in his office.  Also, your blood pressure today is elevated, please take your blood pressure medications every day as directed and call your primary care provider to arrange a follow-up appointment.

## 2019-08-21 NOTE — ED Provider Notes (Signed)
[facility] EMERGENCY DEPARTMENT Provider Note   CSN: [id number] Arrival date & time: 08/21/19  [id number]     History Chief Complaint  Patient presents with  . Fall    [name] is a 69 y.o. male.  The history is provided by the patient. The history is limited by a language barrier. A language interpreter was used.  Fall Pertinent negatives include no chest pain and no headaches.        [name] is a 69 y.o. male who presents to the Emergency Department complaining of pain and swelling of his left wrist secondary to a mechanical fall that occurred approximately 1 hour prior to arrival.  Patient states that he slipped down some steps at his home and fell landing on his left wrist.  He reports immediate pain and swelling with difficulty moving his fingers or bending his wrist.  He describes the pain as constant and throbbing.  He denies numbness or tingling to his fingers.  He also denies head injury, neck or back pain, hip pain, LOC, headache and dizziness.  He states that he takes 1 aspirin daily but denies any blood thinners.  He took Aleve prior to arrival.    Past Medical History:  Diagnosis Date  . Diabetes mellitus   . HLD (hyperlipidemia)   . Hypertension     Patient Active Problem List   Diagnosis Date Noted  . Atrial fibrillation ([place]) 10/21/2010  . Diabetes mellitus 10/21/2010  . Hypertension 10/21/2010  . Chronic kidney disease (CKD), stage IV (severe) ([place]) 10/21/2010  . ABDOMINAL WALL HERNIA 06/20/2010    Past Surgical History:  Procedure Laterality Date  . APPENDECTOMY     ruptured appendix 13 years ago did not close wound  . HERNIA REPAIR     11 yrs ago        History reviewed. No pertinent family history.  Social History   Tobacco Use  . Smoking status: Never Smoker  . Smokeless tobacco: Never Used  Substance Use Topics  . Alcohol use: No  . Drug use: No    Home Medications Prior to Admission medications   Medication Sig Start Date  End Date Taking? Authorizing Provider  aspirin 81 MG tablet Take 81 mg by mouth daily.      [provider]  cloNIDine (CATAPRES) 0.1 MG tablet Take 1 tablet (0.1 mg total) by mouth 2 (two) times daily. 02/14/11 02/14/12  Ezra Sites, MD  lisinopril (PRINIVIL,ZESTRIL) 5 MG tablet Take 5 mg by mouth daily.      [provider]  metFORMIN (GLUCOPHAGE) 500 MG tablet Take 500 mg by mouth 2 (two) times daily with a meal.      [provider]  Omega-3 Fatty Acids (FISH OIL) 1000 MG CAPS Take 1 capsule by mouth daily.      [provider]    Allergies    Patient has no known allergies.  Review of Systems   Review of Systems  Constitutional: Negative for chills and fever.  Eyes: Negative for visual disturbance.  Cardiovascular: Negative for chest pain.  Gastrointestinal: Negative for nausea and vomiting.  Musculoskeletal: Positive for arthralgias (left wrist pain and swelling) and joint swelling. Negative for back pain and neck pain.  Skin: Negative for color change and wound.  Neurological: Negative for dizziness, syncope, weakness, numbness and headaches.  Psychiatric/Behavioral: Negative for confusion.    Physical Exam Updated Vital Signs BP (!) 221/106 (BP Location: Right Arm)   Pulse 61  Temp 97.8 F (36.6 C) (Oral)   Resp 16   Ht 6' (1.829 m)   Wt 100.7 kg   SpO2 97%   BMI 30.11 kg/m   Physical Exam Vitals and nursing note reviewed.  Constitutional:      General: He is not in acute distress.    Appearance: Normal appearance. He is well-developed. He is not ill-appearing.  HENT:     Head: Normocephalic and atraumatic.  Eyes:     Extraocular Movements: Extraocular movements intact.     Pupils: Pupils are equal, round, and reactive to light.  Cardiovascular:     Rate and Rhythm: Normal rate and regular rhythm.     Pulses: Normal pulses.  Pulmonary:     Effort: Pulmonary effort is normal.     Breath sounds: Normal breath sounds.    Chest:     Chest wall: No tenderness.  Abdominal:     General: There is no distension.     Palpations: Abdomen is soft.     Tenderness: There is no abdominal tenderness.  Musculoskeletal:        General: Swelling, tenderness, deformity and signs of injury present.     Cervical back: Normal range of motion.     Comments: ttp of the left distal wrist.  Moderate edema and bony deformity noted.  Small abrasion to the ulnar aspect of the wrist.  No active bleeding.  No proximal tenderness.  Compartments are soft.  Radial pulse is palpable  Skin:    General: Skin is warm.     Capillary Refill: Capillary refill takes less than 2 seconds.  Neurological:     General: No focal deficit present.     Mental Status: He is alert.     Sensory: No sensory deficit.     Motor: No weakness or abnormal muscle tone.     Coordination: Coordination normal.     ED Results / Procedures / Treatments   Labs (all labs ordered are listed, but only abnormal results are displayed) Labs Reviewed - No data to display  EKG None  Radiology DG Wrist Complete Left  Result Date: 08/21/2019 CLINICAL DATA:  Fall.  Pain. EXAM: LEFT WRIST - COMPLETE 3+ VIEW COMPARISON:  None. FINDINGS: Comminuted, impacted distal radius fracture with intra-articular extension. Dorsal displacement of distal fracture fragments. Ulnar styloid displaced fracture. Soft tissue swelling. IMPRESSION: Impacted comminuted distal radius fracture with intra-articular extension. Ulnar styloid fracture. Electronically Signed   By: Abigail Miyamoto M.D.   On: 08/21/2019 09:41    Procedures Procedures (including critical care time)  Medications Ordered in ED Medications  oxyCODONE-acetaminophen (PERCOCET/ROXICET) 5-325 MG per tablet 1 tablet (has no administration in time range)    ED Course  I have reviewed the triage vital signs and the nursing notes.  Pertinent labs & imaging results that were available during my care of the patient were  reviewed by me and considered in my medical decision making (see chart for details).    MDM Rules/Calculators/A&P                      Consulted Dr. Aline Brochure and discussed findings, he recommends  Sugar tong splint and he will see pt in office on Monday at 11 am.     On recheck, pain has improved, but pt remains hypertensive.  States he took his BP meds last evening.  Denies any associated sx's.  Will give additional dose of his lisinopril and he agrees to have his  BP rechecked by his PCP  SPLINT APPLICATION Date/Time: [id number] AM Authorized by: Ayianna Darnold Consent: Verbal consent obtained. Risks and benefits: risks, benefits and alternatives were discussed Consent given by: patient Splint applied by: nursing staff Location details: left wrist Splint type: sugar tong Supplies used: orthoglass, ace wrap, sling Post-procedure: on recheck by me, the splinted body part was neurovascularly unchanged following the procedure. Patient tolerance: Patient tolerated the procedure well with no immediate complications.     Final Clinical Impression(s) / ED Diagnoses Final diagnoses:  Wrist fracture, closed, left, initial encounter  Hypertension, unspecified type    Rx / DC Orders ED Discharge Orders    None       Kem Parkinson, PA-C 08/21/19 [place], MD 08/22/19 [phone]

## 2019-08-23 ENCOUNTER — Other Ambulatory Visit: Payer: Self-pay

## 2019-08-23 ENCOUNTER — Ambulatory Visit (INDEPENDENT_AMBULATORY_CARE_PROVIDER_SITE_OTHER): Payer: Self-pay | Admitting: Orthopedic Surgery

## 2019-08-23 ENCOUNTER — Encounter: Payer: Self-pay | Admitting: Orthopedic Surgery

## 2019-08-23 ENCOUNTER — Telehealth: Payer: Self-pay | Admitting: Orthopedic Surgery

## 2019-08-23 VITALS — BP 181/110 | HR 95 | Ht 72.0 in | Wt 210.0 lb

## 2019-08-23 DIAGNOSIS — S52592A Other fractures of lower end of left radius, initial encounter for closed fracture: Secondary | ICD-10-CM

## 2019-08-23 NOTE — Progress Notes (Signed)
EMERGENCY ROOM FOLLOW UP  NEW PROBLEM/PATIENT   Patient ID: [name], male   DOB: Feb 25, 1951, 69 y.o.   MRN: [id number]  Emergency room record from (date) January 30, 2021has been reviewed and this is included by reference and includes the review of systems with the following addition:   Chief Complaint  Patient presents with  . Wrist Injury    left wrist fracture 08/21/19    HPI [name] is a 69 y.o. male.  Here today ER follow-up left distal radius comminuted fracture 69 year old male right-hand-dominant noninsulin-dependent diabetic with hypertension uninsured presents with a comminuted distal radius fracture left wrist complains of pain no numbness or tingling   Review of Systems Review of Systems  All other systems reviewed and are negative.    has a past medical history of Diabetes mellitus, HLD (hyperlipidemia), and Hypertension.   Past Surgical History:  Procedure Laterality Date  . APPENDECTOMY     ruptured appendix 13 years ago did not close wound  . HERNIA REPAIR     11 yrs ago     History reviewed. No pertinent family history.  Social History Social History   Tobacco Use  . Smoking status: Never Smoker  . Smokeless tobacco: Never Used  Substance Use Topics  . Alcohol use: No  . Drug use: No    No Known Allergies  Current Outpatient Medications  Medication Sig Dispense Refill  . aspirin 81 MG tablet Take 81 mg by mouth daily.      Marland Kitchen HYDROcodone-acetaminophen (NORCO/VICODIN) 5-325 MG tablet Take one tab po q 4 hrs prn pain 12 tablet 0  . lisinopril (PRINIVIL,ZESTRIL) 5 MG tablet Take 5 mg by mouth daily.      . metFORMIN (GLUCOPHAGE) 500 MG tablet Take 500 mg by mouth 2 (two) times daily with a meal.      . Omega-3 Fatty Acids (FISH OIL) 1000 MG CAPS Take 1 capsule by mouth daily.      . cloNIDine (CATAPRES) 0.1 MG tablet Take 1 tablet (0.1 mg total) by mouth 2 (two) times daily. 30 tablet 0   No current facility-administered medications for  this visit.    Physical Exam BP (!) 181/110   Pulse 95   Ht 6' (1.829 m)   Wt 210 lb (95.3 kg)   BMI 28.48 kg/m  Body mass index is 28.48 kg/m.  Well developed and well nourished  Stands with normal weight bearing line  Alert and oriented x 3  Normal affect and mood  Ortho Exam  Right upper extremity neurovascular intact sensation normal no tenderness normal range of motion wrist is stable muscle tone normal  Left wrist neurovascularly intact skin normal tenderness distal radius with swelling decreased range of motion secondary to pain wrist x-ray shows stability of the wrist in terms of fracture pattern and muscle tone is normal  Data Reviewed IMAGING From THE ER AND THE REPORT ARE REVIEWED, MY INTERPRETATION OF THE IMAGE(S) IS : Comminuted distal fracture of the radius with some 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   Arther Abbott, MD 08/23/2019 11:31 AM

## 2019-08-23 NOTE — Telephone Encounter (Signed)
Per clinic supervisor Abigail Butts, who has spoken with patient's daughter [place], printed office notes; Abigail Butts is contacting radiology department to request copy of films/CD per patient's family's request. Patient aware they will be ready for pick up.

## 2019-08-23 NOTE — Addendum Note (Signed)
Addended byCandice Camp on: 08/23/2019 11:49 AM   Modules accepted: Orders

## 2019-08-23 NOTE — Telephone Encounter (Signed)
Patient himself, and family member, grandson/son of patient's daughter [name], came back to office at time of closing today, mainly to request copy of films. Grandson translated and spoke on patient's behalf, relays that family requests to take patient to Dr Case, [place] in [place].  I spoke with clinical staff, Amy, who relayed that we have made the urgent referral to orthopaedic hand surgeon, Dr Ellen Henri, at [facility]. Appointment is scheduled for tomorrow, 08/24/19, with Dr Marcello Moores. Patient has not yet cancelled it; said needs the Xrays.  We relayed that we are unable to print the Xray CD, and that [facility] radiology can provide films - in meantime, patient's daughter [name], called and was very insistent on phone about giving the films to her dad, and that it is not anyone's business as to whom they want to see. She used profanity on the phone with both front office staff, and said she needed to speak with whomever is in charge. She stated that this matter has got to be resolved today.  I relayed that our clinic supervisor is not on site at this time, and that I would reach her and ask her to call her back. - Please call [phone] to speak with [name]. (I reached out to [place] at 5:05pm, and Abigail Butts is calling her immediately.)

## 2019-08-24 ENCOUNTER — Other Ambulatory Visit: Payer: Self-pay | Admitting: Orthopedic Surgery

## 2019-08-24 NOTE — Telephone Encounter (Signed)
I called yesterday pm s/w [place].  She will come by this AM to our clinic to pickup office notes.  Arbie Cookey will print.  I have s/w Manuela Schwartz at [facility], she will burn cd and they will pick that up from [facility] as well.  They plan to see Dr Case in [place] for treatment.  I have notified [facility] as well that patient will not be coming for the appt today.

## 2019-08-26 ENCOUNTER — Other Ambulatory Visit (HOSPITAL_COMMUNITY)
Admission: RE | Admit: 2019-08-26 | Discharge: 2019-08-26 | Disposition: A | Discharge status: Home / Self Care | Payer: Self-pay | Source: Ambulatory Visit | Attending: Orthopedic Surgery | Admitting: Orthopedic Surgery

## 2019-08-26 ENCOUNTER — Other Ambulatory Visit: Payer: Self-pay

## 2019-08-27 ENCOUNTER — Encounter (HOSPITAL_BASED_OUTPATIENT_CLINIC_OR_DEPARTMENT_OTHER): Payer: Self-pay | Admitting: Orthopedic Surgery

## 2019-08-27 ENCOUNTER — Other Ambulatory Visit: Payer: Self-pay

## 2019-08-31 ENCOUNTER — Other Ambulatory Visit (HOSPITAL_COMMUNITY)
Admission: RE | Admit: 2019-08-31 | Discharge: 2019-08-31 | Disposition: A | Discharge status: Home / Self Care | Payer: HRSA Program | Source: Ambulatory Visit | Attending: Orthopedic Surgery | Admitting: Orthopedic Surgery

## 2019-08-31 ENCOUNTER — Other Ambulatory Visit: Payer: Self-pay

## 2019-08-31 ENCOUNTER — Encounter (HOSPITAL_BASED_OUTPATIENT_CLINIC_OR_DEPARTMENT_OTHER)
Admission: RE | Admit: 2019-08-31 | Discharge: 2019-08-31 | Disposition: A | Discharge status: Home / Self Care | Payer: Self-pay | Source: Ambulatory Visit | Attending: Orthopedic Surgery | Admitting: Orthopedic Surgery

## 2019-08-31 DIAGNOSIS — Z01812 Encounter for preprocedural laboratory examination: Secondary | ICD-10-CM | POA: Insufficient documentation

## 2019-08-31 DIAGNOSIS — Z20822 Contact with and (suspected) exposure to covid-19: Secondary | ICD-10-CM | POA: Diagnosis not present

## 2019-08-31 DIAGNOSIS — Z01810 Encounter for preprocedural cardiovascular examination: Secondary | ICD-10-CM | POA: Insufficient documentation

## 2019-08-31 LAB — SARS CORONAVIRUS 2 (TAT 6-24 HRS): SARS Coronavirus 2: NEGATIVE

## 2019-08-31 LAB — BASIC METABOLIC PANEL
Anion gap: 8 (ref 5–15)
BUN: 23 mg/dL (ref 8–23)
CO2: 23 mmol/L (ref 22–32)
Calcium: 8.9 mg/dL (ref 8.9–10.3)
Chloride: 109 mmol/L (ref 98–111)
Creatinine, Ser: 1.45 mg/dL — ABNORMAL HIGH (ref 0.61–1.24)
GFR calc Af Amer: 56 mL/min — ABNORMAL LOW (ref >60)
GFR calc non Af Amer: 48 mL/min — ABNORMAL LOW (ref >60)
Glucose, Bld: 142 mg/dL — ABNORMAL HIGH (ref 70–99)
Potassium: 4.9 mmol/L (ref 3.5–5.1)
Sodium: 140 mmol/L (ref 135–145)

## 2019-08-31 LAB — SURGICAL PCR SCREEN
MRSA, PCR: NEGATIVE
Staphylococcus aureus: NEGATIVE

## 2019-08-31 MED ORDER — ENSURE PRE-SURGERY PO LIQD: 296.0000 mL | Freq: Once | ORAL | Status: DC

## 2019-08-31 NOTE — Progress Notes (Signed)

## 2019-08-31 NOTE — Progress Notes (Signed)
Per the daughter [place], she sts to this Probation officer that the pt tested + 1 month ago for covid, but he did not have any proof. She sts that he was called by a drug store in [place] with the results. Pt tested today due to no results on hand and pt was asymptomatic at the time of testing. [place] she will try to get a copy of his results just in case this test comes back +, his procedure will not have to cancelled.

## 2019-08-31 NOTE — Progress Notes (Signed)
EKG reviewed by Dr. Woodrum, will proceed with surgery as scheduled.  

## 2019-09-03 ENCOUNTER — Ambulatory Visit (HOSPITAL_COMMUNITY)
Admission: RE | Admit: 2019-09-03 | Discharge: 2019-09-03 | Disposition: A | Discharge status: Home / Self Care | Payer: Self-pay | Attending: Orthopedic Surgery | Admitting: Orthopedic Surgery

## 2019-09-03 ENCOUNTER — Ambulatory Visit (HOSPITAL_COMMUNITY): Payer: Self-pay

## 2019-09-03 ENCOUNTER — Other Ambulatory Visit: Payer: Self-pay

## 2019-09-03 ENCOUNTER — Encounter (HOSPITAL_BASED_OUTPATIENT_CLINIC_OR_DEPARTMENT_OTHER)
Admission: RE | Disposition: A | Discharge status: Home / Self Care | Payer: Self-pay | Source: Home / Self Care | Attending: Orthopedic Surgery

## 2019-09-03 ENCOUNTER — Ambulatory Visit (HOSPITAL_BASED_OUTPATIENT_CLINIC_OR_DEPARTMENT_OTHER): Payer: Self-pay | Admitting: Anesthesiology

## 2019-09-03 ENCOUNTER — Encounter (HOSPITAL_BASED_OUTPATIENT_CLINIC_OR_DEPARTMENT_OTHER): Payer: Self-pay | Admitting: Orthopedic Surgery

## 2019-09-03 DIAGNOSIS — I10 Essential (primary) hypertension: Secondary | ICD-10-CM | POA: Insufficient documentation

## 2019-09-03 DIAGNOSIS — Z79899 Other long term (current) drug therapy: Secondary | ICD-10-CM | POA: Insufficient documentation

## 2019-09-03 DIAGNOSIS — Z7982 Long term (current) use of aspirin: Secondary | ICD-10-CM | POA: Insufficient documentation

## 2019-09-03 DIAGNOSIS — E119 Type 2 diabetes mellitus without complications: Secondary | ICD-10-CM | POA: Insufficient documentation

## 2019-09-03 DIAGNOSIS — Z4789 Encounter for other orthopedic aftercare: Secondary | ICD-10-CM

## 2019-09-03 DIAGNOSIS — E785 Hyperlipidemia, unspecified: Secondary | ICD-10-CM | POA: Insufficient documentation

## 2019-09-03 DIAGNOSIS — S52572A Other intraarticular fracture of lower end of left radius, initial encounter for closed fracture: Secondary | ICD-10-CM | POA: Insufficient documentation

## 2019-09-03 DIAGNOSIS — Z7984 Long term (current) use of oral hypoglycemic drugs: Secondary | ICD-10-CM | POA: Insufficient documentation

## 2019-09-03 DIAGNOSIS — X58XXXA Exposure to other specified factors, initial encounter: Secondary | ICD-10-CM | POA: Insufficient documentation

## 2019-09-03 DIAGNOSIS — Y939 Activity, unspecified: Secondary | ICD-10-CM | POA: Insufficient documentation

## 2019-09-03 DIAGNOSIS — I4891 Unspecified atrial fibrillation: Secondary | ICD-10-CM | POA: Insufficient documentation

## 2019-09-03 HISTORY — PX: OPEN REDUCTION INTERNAL FIXATION (ORIF) DISTAL RADIAL FRACTURE: SHX5989

## 2019-09-03 LAB — GLUCOSE, CAPILLARY
Glucose-Capillary: 166 mg/dL — ABNORMAL HIGH (ref 70–99)
Glucose-Capillary: 151 mg/dL — ABNORMAL HIGH (ref 70–99)

## 2019-09-03 SURGERY — OPEN REDUCTION INTERNAL FIXATION (ORIF) DISTAL RADIUS FRACTURE
Anesthesia: General | Site: Wrist | Laterality: Left

## 2019-09-03 MED ORDER — LIDOCAINE 2% (20 MG/ML) 5 ML SYRINGE
INTRAMUSCULAR | Status: DC | PRN
  Administered 2019-09-03: 100 mg via INTRAVENOUS

## 2019-09-03 MED ORDER — BUPIVACAINE HCL (PF) 0.5 % IJ SOLN
INTRAMUSCULAR | Status: DC | PRN
  Administered 2019-09-03: 15 mL via PERINEURAL

## 2019-09-03 MED ORDER — CEFAZOLIN SODIUM-DEXTROSE 2-4 GM/100ML-% IV SOLN
2.0000 g | INTRAVENOUS | Status: AC
  Administered 2019-09-03: 09:00:00 2 g via INTRAVENOUS

## 2019-09-03 MED ORDER — ONDANSETRON HCL 4 MG/2ML IJ SOLN
INTRAMUSCULAR | Status: DC | PRN
  Administered 2019-09-03: 4 mg via INTRAVENOUS

## 2019-09-03 MED ORDER — ACETAMINOPHEN 10 MG/ML IV SOLN
1000.0000 mg | Freq: Once | INTRAVENOUS | Status: DC | PRN
  Administered 2019-09-03: 1000 mg via INTRAVENOUS

## 2019-09-03 MED ORDER — OXYCODONE HCL 5 MG/5ML PO SOLN: 5.0000 mg | Freq: Once | ORAL | Status: AC | PRN

## 2019-09-03 MED ORDER — CHLORHEXIDINE GLUCONATE 4 % EX LIQD: 60.0000 mL | Freq: Once | CUTANEOUS | Status: DC

## 2019-09-03 MED ORDER — LIDOCAINE 2% (20 MG/ML) 5 ML SYRINGE
INTRAMUSCULAR | Status: AC
  Filled 2019-09-03: qty 5

## 2019-09-03 MED ORDER — POVIDONE-IODINE 10 % EX SWAB
2.0000 "application " | Freq: Once | CUTANEOUS | Status: AC
  Administered 2019-09-03: 2 via TOPICAL

## 2019-09-03 MED ORDER — IBUPROFEN 200 MG PO TABS: 600.0000 mg | ORAL_TABLET | Freq: Four times a day (QID) | ORAL | Status: DC

## 2019-09-03 MED ORDER — FENTANYL CITRATE (PF) 100 MCG/2ML IJ SOLN
INTRAMUSCULAR | Status: AC
  Filled 2019-09-03: qty 2

## 2019-09-03 MED ORDER — PROMETHAZINE HCL 25 MG/ML IJ SOLN: 6.2500 mg | INTRAMUSCULAR | Status: DC | PRN

## 2019-09-03 MED ORDER — MIDAZOLAM HCL 2 MG/2ML IJ SOLN
INTRAMUSCULAR | Status: AC
  Filled 2019-09-03: qty 2

## 2019-09-03 MED ORDER — PHENYLEPHRINE 40 MCG/ML (10ML) SYRINGE FOR IV PUSH (FOR BLOOD PRESSURE SUPPORT)
PREFILLED_SYRINGE | INTRAVENOUS | Status: AC
  Filled 2019-09-03: qty 10

## 2019-09-03 MED ORDER — PHENYLEPHRINE 40 MCG/ML (10ML) SYRINGE FOR IV PUSH (FOR BLOOD PRESSURE SUPPORT)
PREFILLED_SYRINGE | INTRAVENOUS | Status: DC | PRN
  Administered 2019-09-03 (×2): 80 ug via INTRAVENOUS

## 2019-09-03 MED ORDER — LIDOCAINE-EPINEPHRINE (PF) 1.5 %-1:200000 IJ SOLN
INTRAMUSCULAR | Status: DC | PRN
  Administered 2019-09-03: 10 mL via PERINEURAL

## 2019-09-03 MED ORDER — PROPOFOL 10 MG/ML IV BOLUS
INTRAVENOUS | Status: AC
  Filled 2019-09-03: qty 40

## 2019-09-03 MED ORDER — EPHEDRINE 5 MG/ML INJ
INTRAVENOUS | Status: AC
  Filled 2019-09-03: qty 10

## 2019-09-03 MED ORDER — OXYCODONE HCL 5 MG PO TABS
ORAL_TABLET | ORAL | Status: AC
  Filled 2019-09-03: qty 1

## 2019-09-03 MED ORDER — FENTANYL CITRATE (PF) 100 MCG/2ML IJ SOLN
50.0000 ug | INTRAMUSCULAR | Status: DC | PRN
  Administered 2019-09-03: 50 ug via INTRAVENOUS

## 2019-09-03 MED ORDER — PROPOFOL 10 MG/ML IV BOLUS
INTRAVENOUS | Status: DC | PRN
  Administered 2019-09-03: 180 mg via INTRAVENOUS

## 2019-09-03 MED ORDER — CEFAZOLIN SODIUM-DEXTROSE 2-4 GM/100ML-% IV SOLN
INTRAVENOUS | Status: AC
  Filled 2019-09-03: qty 100

## 2019-09-03 MED ORDER — ACETAMINOPHEN 325 MG PO TABS: 650.0000 mg | ORAL_TABLET | Freq: Four times a day (QID) | ORAL | Status: DC

## 2019-09-03 MED ORDER — LACTATED RINGERS IV SOLN: INTRAVENOUS | Status: DC

## 2019-09-03 MED ORDER — ACETAMINOPHEN 10 MG/ML IV SOLN
INTRAVENOUS | Status: AC
  Filled 2019-09-03: qty 100

## 2019-09-03 MED ORDER — OXYCODONE HCL 5 MG PO TABS
5.0000 mg | ORAL_TABLET | Freq: Once | ORAL | Status: AC | PRN
  Administered 2019-09-03: 5 mg via ORAL

## 2019-09-03 MED ORDER — FENTANYL CITRATE (PF) 100 MCG/2ML IJ SOLN
25.0000 ug | INTRAMUSCULAR | Status: DC | PRN
  Administered 2019-09-03 (×2): 50 ug via INTRAVENOUS

## 2019-09-03 MED ORDER — DEXAMETHASONE SODIUM PHOSPHATE 10 MG/ML IJ SOLN
INTRAMUSCULAR | Status: DC | PRN
  Administered 2019-09-03: 4 mg via INTRAVENOUS

## 2019-09-03 MED ORDER — MIDAZOLAM HCL 2 MG/2ML IJ SOLN
1.0000 mg | INTRAMUSCULAR | Status: DC | PRN
  Administered 2019-09-03: 09:00:00 1 mg via INTRAVENOUS

## 2019-09-03 MED ORDER — OXYCODONE HCL 5 MG PO TABS: 5.0000 mg | ORAL_TABLET | Freq: Four times a day (QID) | ORAL | 0 refills | Status: DC | PRN

## 2019-09-03 SURGICAL SUPPLY — 70 items
BAND RUBBER #18 3X1/16 STRL (MISCELLANEOUS) IMPLANT
BIT DRILL SOLID 2.0X40MM (BIT) IMPLANT
BIT DRILL SOLID 2.5X40MM (BIT) IMPLANT
BLADE MINI RND TIP GREEN BEAV (BLADE) IMPLANT
BLADE SURG 15 STRL LF DISP TIS (BLADE) ×1 IMPLANT
BLADE SURG 15 STRL SS (BLADE) ×2
BNDG COHESIVE 2X5 TAN STRL LF (GAUZE/BANDAGES/DRESSINGS) IMPLANT
BNDG COHESIVE 4X5 TAN STRL (GAUZE/BANDAGES/DRESSINGS) ×3 IMPLANT
BNDG ESMARK 4X9 LF (GAUZE/BANDAGES/DRESSINGS) ×3 IMPLANT
BNDG GAUZE ELAST 4 BULKY (GAUZE/BANDAGES/DRESSINGS) ×3 IMPLANT
BRUSH SCRUB EZ PLAIN DRY (MISCELLANEOUS) IMPLANT
CANISTER SUCT 1200ML W/VALVE (MISCELLANEOUS) ×3 IMPLANT
CHLORAPREP W/TINT 26 (MISCELLANEOUS) ×3 IMPLANT
CORD BIPOLAR FORCEPS 12FT (ELECTRODE) ×3 IMPLANT
COVER BACK TABLE 60X90IN (DRAPES) ×3 IMPLANT
COVER MAYO STAND STRL (DRAPES) ×3 IMPLANT
COVER WAND RF STERILE (DRAPES) IMPLANT
CUFF TOURN SGL QUICK 18X4 (TOURNIQUET CUFF) ×3 IMPLANT
CUFF TOURN SGL QUICK 24 (TOURNIQUET CUFF)
CUFF TRNQT CYL 24X4X16.5-23 (TOURNIQUET CUFF) IMPLANT
DRAPE C-ARM 42X72 X-RAY (DRAPES) ×3 IMPLANT
DRAPE EXTREMITY T 121X128X90 (DISPOSABLE) ×3 IMPLANT
DRAPE SURG 17X23 STRL (DRAPES) ×3 IMPLANT
DRILL SOLID 2.0X40MM (BIT)
DRILL SOLID 2.5X40MM (BIT)
DRSG ADAPTIC 3X8 NADH LF (GAUZE/BANDAGES/DRESSINGS) ×3 IMPLANT
DRSG EMULSION OIL 3X3 NADH (GAUZE/BANDAGES/DRESSINGS) IMPLANT
ELECT REM PT RETURN 9FT ADLT (ELECTROSURGICAL) ×3
ELECTRODE REM PT RTRN 9FT ADLT (ELECTROSURGICAL) ×1 IMPLANT
GAUZE SPONGE 4X4 12PLY STRL LF (GAUZE/BANDAGES/DRESSINGS) ×3 IMPLANT
GLOVE BIO SURGEON STRL SZ7.5 (GLOVE) ×3 IMPLANT
GLOVE BIOGEL PI IND STRL 7.0 (GLOVE) ×1 IMPLANT
GLOVE BIOGEL PI IND STRL 8 (GLOVE) ×1 IMPLANT
GLOVE BIOGEL PI INDICATOR 7.0 (GLOVE) ×2
GLOVE BIOGEL PI INDICATOR 8 (GLOVE) ×2
GLOVE ECLIPSE 6.5 STRL STRAW (GLOVE) ×3 IMPLANT
GLOVE SURG SS PI 7.0 STRL IVOR (GLOVE) ×3 IMPLANT
GOWN STRL REUS W/ TWL LRG LVL3 (GOWN DISPOSABLE) ×2 IMPLANT
GOWN STRL REUS W/TWL LRG LVL3 (GOWN DISPOSABLE) ×4
GOWN STRL REUS W/TWL XL LVL3 (GOWN DISPOSABLE) ×3 IMPLANT
GUIDE AIMING 1.5MM (WIRE) ×9 IMPLANT
NEEDLE HYPO 25X1 1.5 SAFETY (NEEDLE) IMPLANT
NS IRRIG 1000ML POUR BTL (IV SOLUTION) ×3 IMPLANT
PACK BASIN DAY SURGERY FS (CUSTOM PROCEDURE TRAY) ×3 IMPLANT
PADDING CAST ABS 4INX4YD NS (CAST SUPPLIES)
PADDING CAST ABS COTTON 4X4 ST (CAST SUPPLIES) IMPLANT
PENCIL SMOKE EVACUATOR (MISCELLANEOUS) ×3 IMPLANT
PLATE GEMINUS HOOK (Plate) ×3 IMPLANT
SCREW GEMINUS HOOK PLATE (Screw) ×3 IMPLANT
SKELETAL DYNAMICS DVR SET (Set) ×3 IMPLANT
SLEEVE SCD COMPRESS KNEE MED (MISCELLANEOUS) ×3 IMPLANT
SLING ARM FOAM STRAP LRG (SOFTGOODS) IMPLANT
SPLINT PLASTER CAST XFAST 3X15 (CAST SUPPLIES) IMPLANT
SPLINT PLASTER XTRA FASTSET 3X (CAST SUPPLIES)
STOCKINETTE 6  STRL (DRAPES) ×2
STOCKINETTE 6 STRL (DRAPES) ×1 IMPLANT
SUCTION FRAZIER HANDLE 10FR (MISCELLANEOUS) ×2
SUCTION TUBE FRAZIER 10FR DISP (MISCELLANEOUS) ×1 IMPLANT
SUT VIC AB 2-0 PS2 27 (SUTURE) ×3 IMPLANT
SUT VICRYL 4-0 PS2 18IN ABS (SUTURE) IMPLANT
SUT VICRYL RAPIDE 4-0 (SUTURE) IMPLANT
SUT VICRYL RAPIDE 4/0 PS 2 (SUTURE) ×3 IMPLANT
SYR 10ML LL (SYRINGE) IMPLANT
SYR BULB 3OZ (MISCELLANEOUS) ×3 IMPLANT
TOWEL GREEN STERILE FF (TOWEL DISPOSABLE) ×3 IMPLANT
TUBE CONNECTING 20'X1/4 (TUBING) ×1
TUBE CONNECTING 20X1/4 (TUBING) ×2 IMPLANT
UNDERPAD 30X36 HEAVY ABSORB (UNDERPADS AND DIAPERS) ×3 IMPLANT
WIRE FIX 1.5 STANDARD TIP (WIRE)
WIRE FIX 1.5 STD TIP (WIRE) IMPLANT

## 2019-09-03 NOTE — Transfer of Care (Signed)
Immediate Anesthesia Transfer of Care Note  Patient: [name]  Procedure(s) Performed: OPEN REDUCTION INTERNAL FIXATION (ORIF) DISTAL RADIAL FRACTURE (Left Wrist)  Patient Location: PACU  Anesthesia Type:GA combined with regional for post-op pain  Level of Consciousness: sedated and responds to stimulation  Airway & Oxygen Therapy: Patient Spontanous Breathing and Patient connected to nasal cannula oxygen  Post-op Assessment: Report given to RN and Post -op Vital signs reviewed and stable  Post vital signs: Reviewed and stable  Last Vitals:  Vitals Value Taken Time  BP 169/100 09/03/19 1108  Temp    Pulse 82 09/03/19 1109  Resp 20 09/03/19 1109  SpO2 97 % 09/03/19 1109  Vitals shown include unvalidated device data.  Last Pain:  Vitals:   09/03/19 0749  TempSrc: Oral  PainSc: 0-No pain      Patients Stated Pain Goal: 3 ([id number])  Complications: No apparent anesthesia complications

## 2019-09-03 NOTE — Progress Notes (Signed)
Assisted Dr. Rose with left, ultrasound guided, supraclavicular block. Side rails up, monitors on throughout procedure. See vital signs in flow sheet. Tolerated Procedure well. 

## 2019-09-03 NOTE — H&P (Signed)
[name] is an 69 y.o. male.   Chief Complaint: left wrist injury HPI: Displaced comminuted left intra-articular distal radius fracture, referred from [facility] emergency department to Dr. Aline Brochure, who subsequently evaluated the patient and fracture, noted its complexity, and referred him for hand surgical specialty consultation and treatment.  Past Medical History:  Diagnosis Date  . Diabetes mellitus   . HLD (hyperlipidemia)   . Hypertension     Past Surgical History:  Procedure Laterality Date  . APPENDECTOMY     ruptured appendix 13 years ago did not close wound  . HERNIA REPAIR     11 yrs ago     History reviewed. No pertinent family history. Social History:  reports that he has never smoked. He has never used smokeless tobacco. He reports that he does not drink alcohol or use drugs.  Allergies: No Known Allergies  Medications Prior to Admission  Medication Sig Dispense Refill  . amLODipine (NORVASC) 5 MG tablet Take 5 mg by mouth daily.    Marland Kitchen aspirin 81 MG tablet Take 81 mg by mouth daily.      . benazepril (LOTENSIN) 40 MG tablet Take 40 mg by mouth daily.    Marland Kitchen glipiZIDE (GLUCOTROL) 10 MG tablet Take 10 mg by mouth daily before breakfast.    . HYDROcodone-acetaminophen (NORCO/VICODIN) 5-325 MG tablet Take one tab po q 4 hrs prn pain (Patient taking differently: Take 1 tablet by mouth every 4 (four) hours as needed for moderate pain. ) 12 tablet 0  . levothyroxine (SYNTHROID) 100 MCG tablet Take 50 mcg by mouth daily before breakfast.    . simvastatin (ZOCOR) 20 MG tablet Take 20 mg by mouth daily.    Marland Kitchen atorvastatin (LIPITOR) 40 MG tablet Take 40 mg by mouth daily.      Results for orders placed or performed during the hospital encounter of 09/03/19 (from the past 48 hour(s))  Glucose, capillary     Status: Abnormal   Collection Time: 09/03/19  7:48 AM  Result Value Ref Range   Glucose-Capillary 166 (H) 70 - 99 mg/dL   No results found.  Review of Systems   All other systems reviewed and are negative.   Blood pressure 124/74, pulse 77, temperature 98.4 F (36.9 C), temperature source Oral, resp. rate 16, height 6' (1.829 m), weight 96 kg, SpO2 100 %. Physical Exam  Constitutional: He is oriented to person, place, and time. He appears well-developed and well-nourished.  HENT:  Head: Normocephalic and atraumatic.  Eyes: EOM are normal.  Cardiovascular: Intact distal pulses.  Respiratory: Effort normal.  GI: Soft.  Musculoskeletal:     Cervical back: Normal range of motion and neck supple.     Comments: Intact light touch sensibility in the radial, median, and ulnar nerve distributions with intact motor to the same.  Tenderness about the distal radius, no specific elbow tenderness  Neurological: He is alert and oriented to person, place, and time. He has normal reflexes.  Skin: Skin is warm and dry.  Psychiatric: He has a normal mood and affect. His behavior is normal.     Assessment/Plan Displaced comminuted left intra-articular distal radius fracture.  I discussed with him and his daughter in the office previously, again confirmed today, the indications for operative reduction and stabilization of the fracture to optimize anatomical restoration and functional recovery and consent was obtained.  The document was executed.  Jolyn Nap, MD 09/03/2019, 9:07 AM

## 2019-09-03 NOTE — Anesthesia Postprocedure Evaluation (Signed)
Anesthesia Post Note  Patient: [name]  Procedure(s) Performed: OPEN REDUCTION INTERNAL FIXATION (ORIF) DISTAL RADIAL FRACTURE (Left Wrist)     Patient location during evaluation: PACU Anesthesia Type: General Level of consciousness: awake and alert Pain management: pain level controlled Vital Signs Assessment: post-procedure vital signs reviewed and stable Respiratory status: spontaneous breathing, nonlabored ventilation, respiratory function stable and patient connected to nasal cannula oxygen Cardiovascular status: blood pressure returned to baseline and stable Postop Assessment: no apparent nausea or vomiting Anesthetic complications: no    Last Vitals:  Vitals:   09/03/19 1145 09/03/19 1200  BP: (!) 166/87 (!) 166/87  Pulse: 74 80  Resp: 14 20  Temp:  (!) 36.4 C  SpO2: 94% 96%    Last Pain:  Vitals:   09/03/19 1215  TempSrc:   PainSc: 3                  Virlan Kempker S

## 2019-09-03 NOTE — Interval H&P Note (Signed)
History and Physical Interval Note:  09/03/2019 9:10 AM  [name]  has presented today for surgery, with the diagnosis of DISPLACED LEFT DISTAL RADIUS FRACTURE.  The various methods of treatment have been discussed with the patient and family. After consideration of risks, benefits and other options for treatment, the patient has consented to  Procedure(s): OPEN REDUCTION INTERNAL FIXATION (ORIF) DISTAL RADIAL FRACTURE (Left) as a surgical intervention.  The patient's history has been reviewed, patient examined, no change in status, stable for surgery.  I have reviewed the patient's chart and labs.  Questions were answered to the patient's satisfaction.     Jolyn Nap

## 2019-09-03 NOTE — Anesthesia Procedure Notes (Signed)
Anesthesia Regional Block: Supraclavicular block   Pre-Anesthetic Checklist: ,, timeout performed, Correct Patient, Correct Site, Correct Laterality, Correct Procedure, Correct Position, site marked, Risks and benefits discussed,  Surgical consent,  Pre-op evaluation,  At surgeon's request and post-op pain management  Laterality: Left  Prep: chloraprep       Needles:  Injection technique: Single-shot  Needle Type: Echogenic Needle     Needle Length: 9cm      Additional Needles:   Procedures:,,,, ultrasound used (permanent image in chart),,,,  Narrative:  Start time: 09/03/2019 8:40 AM End time: 09/03/2019 8:49 AM Injection made incrementally with aspirations every 5 mL.  Performed by: Personally  Anesthesiologist: Myrtie Soman, MD  Additional Notes: Patient tolerated the procedure well without complications

## 2019-09-03 NOTE — Discharge Instructions (Signed)
Regional Anesthesia Blocks  1. Numbness or the inability to move the "blocked" extremity may last from 3-48 hours after placement. The length of time depends on the medication injected and your individual response to the medication. If the numbness is not going away after 48 hours, call your surgeon.  2. The extremity that is blocked will need to be protected until the numbness is gone and the  Strength has returned. Because you cannot feel it, you will need to take extra care to avoid injury. Because it may be weak, you may have difficulty moving it or using it. You may not know what position it is in without looking at it while the block is in effect.  3. For blocks in the legs and feet, returning to weight bearing and walking needs to be done carefully. You will need to wait until the numbness is entirely gone and the strength has returned. You should be able to move your leg and foot normally before you try and bear weight or walk. You will need someone to be with you when you first try to ensure you do not fall and possibly risk injury.  4. Bruising and tenderness at the needle site are common side effects and will resolve in a few days.  5. Persistent numbness or new problems with movement should be communicated to the surgeon or the [place] [phone] [place] [phone]). Post Anesthesia Home Care Instructions  Activity: Get plenty of rest for the remainder of the day. A responsible individual must stay with you for 24 hours following the procedure.  For the next 24 hours, DO NOT: -Drive a car -Paediatric nurse -Drink alcoholic beverages -Take any medication unless instructed by your physician -Make any legal decisions or sign important papers.  Meals: Start with liquid foods such as gelatin or soup. Progress to regular foods as tolerated. Avoid greasy, spicy, heavy foods. If nausea and/or vomiting occur, drink only clear liquids until the  nausea and/or vomiting subsides. Call your physician if vomiting continues.  Special Instructions/Symptoms: Your throat may feel dry or sore from the anesthesia or the breathing tube placed in your throat during surgery. If this causes discomfort, gargle with warm salt water. The discomfort should disappear within 24 hours.  If you had a scopolamine patch placed behind your ear for the management of post- operative nausea and/or vomiting:  1. The medication in the patch is effective for 72 hours, after which it should be removed.  Wrap patch in a tissue and discard in the trash. Wash hands thoroughly with soap and water. 2. You may remove the patch earlier than 72 hours if you experience unpleasant side effects which may include dry mouth, dizziness or visual disturbances. 3. Avoid touching the patch. Wash your hands with soap and water after contact with the patch.    Discharge Instructions   You have a dressing with a plaster splint incorporated in it. Move your fingers as much as possible, making a full fist and fully opening the fist. Elevate your hand to reduce pain & swelling of the digits.  Ice over the operative site may be helpful to reduce pain & swelling.  DO NOT USE HEAT. Pain medicine has been prescribed for you.  Take 650 mg Tylenol and 600 mg Ibuprofen every 6 hours. Use Oxycodone additionally as a rescue medicine only for severe pain.  Leave the dressing in place until you return to our office.  You may shower, but keep  the bandage clean & dry.  You may drive a car when you are off of prescription pain medications and can safely control your vehicle with both hands. Our office will call you to arrange follow-up   Please call [phone] during normal business hours or [phone] after hours for any problems. Including the following:  - excessive redness of the incisions - drainage for more than 4 days - fever of more than 101.5 F  *Please note that pain medications  will not be refilled after hours or on weekends.  Work Status: No lifting, gripping or grasping greater than paper and pencil tasks.

## 2019-09-03 NOTE — Op Note (Signed)
09/03/2019  9:10 AM  PATIENT:  [name]  69 y.o. male  PRE-OPERATIVE DIAGNOSIS: Displaced comminuted left intra-articular distal radius fracture  POST-OPERATIVE DIAGNOSIS:  Same  PROCEDURE: Open treatment of left intra-articular comminuted distal radius fracture, 3+ fragments, 25609  SURGEON: Rayvon Char. Grandville Silos, MD  PHYSICIAN ASSISTANT: Morley Kos, OPA-C  ANESTHESIA:  regional and general  SPECIMENS:  None  DRAINS: None  EBL:  less than 50 mL  PREOPERATIVE INDICATIONS:  [name] is a  69 y.o. male with comminuted displaced left intra-articular distal radius fracture.  The risks benefits and alternatives were discussed with the patient preoperatively including but not limited to the risks of infection, bleeding, nerve injury, cardiopulmonary complications, the need for revision surgery, among others, and the patient verbalized understanding and consented to proceed.  OPERATIVE IMPLANTS: [employer] Geminus plate/screws/pegs with volar ulnar add-on hook plate  OPERATIVE PROCEDURE: After receiving prophylactic antibiotics and a regional block, the patient was escorted to the operative theatre and placed in a supine position.  General anesthesia was administered.  A surgical "time-out" was performed during which the planned procedure, proposed operative site, and the correct patient identity were compared to the operative consent and agreement confirmed by the circulating nurse according to current facility policy. Following application of a tourniquet to the operative extremity, the exposed skin had already been prescribed in the holding area, so then was prepped with Chloraprep and draped in the usual sterile fashion. The limb was exsanguinated with an Esmarch bandage and the tourniquet inflated to approximately 122mmHg higher than systolic BP.   A sinusoidal-shaped incision was marked and made over the FCR axis and the distal forearm. The skin was incised sharply  with scalpel, subcutaneous tissues with blunt and spreading dissection. The FCR axis was exploited deeply. The pronator quadratus was reflected in an L-shaped ulnarly and the brachioradialis was almost divided in the region of injury, rendering it not possible for Z-plasty repair.  The fracture was inspected and provisionally reduced.  This was confirmed fluoroscopically.  This was done by placing a wide plate adjacent to the volar shaft, confirming its approximate location, and securing it first with K wires.  Then, the extended approach was required to better mobilize all the distal fragments, including a small stab wound on the dorsum through which instruments could be placed to help better manipulate intra-articular fragments.  The distal fragments were then reduced and secured to the plate construct with provisional K wires.  Once satisfied with the overall alignment, the provisional fixation was gradually removed and replaced with permanent fixation, including a combination distally of smooth pegs and threaded pegs, and locking and nonlocking screws proximally.  Final images were obtained and the DRUJ was examined for stability. It was found to be sufficiently stable. The wound was then copiously irrigated and the PQ was repaired with 2-0 Vicryl suture.  Tourniquet was released and additional hemostasis obtained and the skin was closed with 2-0 Vicryl deep dermal buried sutures followed by running 4-0 Vicryl Rapide horizontal mattress suture in the skin. A bulky dressing with a volar plaster component was applied and the patient was taken to the recovery room in stable condition.  DISPOSITION: The patient will be discharged home today with typical post-op instructions, returning to therapy around 10 days to have a custom volar splint fabricated with initiation of rehab, then 10 to 15 days postop with me in the office with new x-rays of the left wrist including incline lateral, out of splint.

## 2019-09-03 NOTE — Anesthesia Procedure Notes (Signed)
Procedure Name: LMA Insertion Date/Time: 09/03/2019 9:23 AM Performed by: Myna Bright, CRNA Pre-anesthesia Checklist: Patient identified, Emergency Drugs available, Suction available and Patient being monitored Patient Re-evaluated:Patient Re-evaluated prior to induction Oxygen Delivery Method: Circle system utilized Preoxygenation: Pre-oxygenation with 100% oxygen Induction Type: IV induction Ventilation: Mask ventilation without difficulty LMA: LMA inserted LMA Size: 4.0 Tube type: Oral Number of attempts: 1 Placement Confirmation: positive ETCO2 and breath sounds checked- equal and bilateral Tube secured with: Tape Dental Injury: Teeth and Oropharynx as per pre-operative assessment

## 2019-09-03 NOTE — Anesthesia Procedure Notes (Signed)
Anesthesia Procedure Image    

## 2019-09-03 NOTE — Anesthesia Preprocedure Evaluation (Signed)
Anesthesia Evaluation  Patient identified by MRN, date of birth, ID band Patient awake    Reviewed: Allergy & Precautions, NPO status , Patient's Chart, lab work & pertinent test results  Airway Mallampati: II  TM Distance: >3 FB Neck ROM: Full    Dental no notable dental hx.    Pulmonary neg pulmonary ROS,    Pulmonary exam normal breath sounds clear to auscultation       Cardiovascular hypertension, Normal cardiovascular exam+ dysrhythmias Atrial Fibrillation  Rhythm:Regular Rate:Normal     Neuro/Psych negative neurological ROS  negative psych ROS   GI/Hepatic negative GI ROS, Neg liver ROS,   Endo/Other  diabetes  Renal/GU Renal disease  negative genitourinary   Musculoskeletal negative musculoskeletal ROS (+)   Abdominal   Peds negative pediatric ROS (+)  Hematology negative hematology ROS (+)   Anesthesia Other Findings   Reproductive/Obstetrics negative OB ROS                             Anesthesia Physical Anesthesia Plan  ASA: III  Anesthesia Plan: General   Post-op Pain Management:  Regional for Post-op pain   Induction: Intravenous  PONV Risk Score and Plan: 2 and Ondansetron, Dexamethasone and Treatment may vary due to age or medical condition  Airway Management Planned: LMA  Additional Equipment:   Intra-op Plan:   Post-operative Plan: Extubation in OR  Informed Consent: I have reviewed the patients History and Physical, chart, labs and discussed the procedure including the risks, benefits and alternatives for the proposed anesthesia with the patient or authorized representative who has indicated his/her understanding and acceptance.     Dental advisory given  Plan Discussed with: CRNA and Surgeon  Anesthesia Plan Comments:         Anesthesia Quick Evaluation

## 2019-09-06 ENCOUNTER — Encounter: Payer: Self-pay | Admitting: *Deleted

## 2019-09-08 ENCOUNTER — Ambulatory Visit: Payer: Self-pay | Admitting: *Deleted

## 2019-09-15 ENCOUNTER — Ambulatory Visit: Payer: Self-pay | Attending: Orthopedic Surgery | Admitting: *Deleted

## 2019-09-15 ENCOUNTER — Encounter: Payer: Self-pay | Admitting: *Deleted

## 2019-09-15 ENCOUNTER — Other Ambulatory Visit: Payer: Self-pay

## 2019-09-15 DIAGNOSIS — R600 Localized edema: Secondary | ICD-10-CM | POA: Insufficient documentation

## 2019-09-15 DIAGNOSIS — M6281 Muscle weakness (generalized): Secondary | ICD-10-CM | POA: Insufficient documentation

## 2019-09-15 DIAGNOSIS — R278 Other lack of coordination: Secondary | ICD-10-CM | POA: Insufficient documentation

## 2019-09-15 DIAGNOSIS — M25532 Pain in left wrist: Secondary | ICD-10-CM | POA: Insufficient documentation

## 2019-09-15 NOTE — Therapy (Signed)
[facility] [address] [place], [place], [address] Phone: [phone]   Fax:  [phone]  Occupational Therapy Evaluation  Patient Details  Name: [name] MRN: [id number] Date of Birth: [birth year] Referring Provider (OT): Dr Milly Jakob   Encounter Date: 09/15/2019  OT End of Session - 09/15/19 0923    Visit Number  1    Number of Visits  8    Date for OT Re-Evaluation  10/13/19    Authorization Type  Pt does not have Medicare or Medicaid - Daughter, [place], stated that they may apply for emergency Medicaid in future. He was issued finiancial assistance paperwork from front office and is currently self pay.    OT Start Time  0810    OT Stop Time  0912    OT Time Calculation (min)  62 min    Activity Tolerance  Patient tolerated treatment well;No increased pain    Behavior During Therapy  WFL for tasks assessed/performed       Past Medical History:  Diagnosis Date  . Diabetes mellitus   . HLD (hyperlipidemia)   . Hypertension     Past Surgical History:  Procedure Laterality Date  . APPENDECTOMY     ruptured appendix 13 years ago did not close wound  . HERNIA REPAIR     11 yrs ago   . OPEN REDUCTION INTERNAL FIXATION (ORIF) DISTAL RADIAL FRACTURE Left 09/03/2019   Procedure: OPEN REDUCTION INTERNAL FIXATION (ORIF) DISTAL RADIAL FRACTURE;  Surgeon: Milly Jakob, MD;  Location: [facility];  Service: Orthopedics;  Laterality: Left;    There were no vitals filed for this visit.  Subjective Assessment - 09/15/19 0814    Subjective   Pt is a 69 y/o male whom fell while taking out the trash at home. He sustained a left communited intra-articular distal radius fracture and is now s/o ORIF on 09/03/19. He is currently 12 days post-op.    Patient is accompanied by:  Family member   Daughter - [place]   Pertinent History  Diabetes, HTN, Hyperlidpidemia. Please refer to full chart for  medical history    Patient Stated Goals  Get use of hand/arm back.    Currently in Pain?  Yes    Pain Score  8     Pain Location  Finger (Comment which one)    Pain Orientation  Left    Pain Descriptors / Indicators  Burning;Tingling    Pain Type  Acute pain    Pain Onset  1 to 4 weeks ago    Pain Frequency  Intermittent   Mostly at Noc   Pain Relieving Factors  Pain medicine    Multiple Pain Sites  No        [facility] OT Assessment - 09/15/19 0001      Assessment   Medical Diagnosis  Left Communituted intra-articuluar distal raadius fracture    Referring Provider (OT)  Dr Milly Jakob    Onset Date/Surgical Date  09/03/19   DOS: 09/03/2019; injury 08/21/2019   Hand Dominance  Right    Next MD Visit  09/16/19      Precautions   Precautions  None    Required Braces or Orthoses  Other Brace/Splint   Protective splint L UE     Restrictions   Weight Bearing Restrictions  Yes    LUE Weight Bearing  Non weight bearing      Balance Screen   Has the patient fallen in the past  6 months  Yes    How many times?  1    Has the patient had a decrease in activity level because of a fear of falling?   No    Is the patient reluctant to leave their home because of a fear of falling?   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      Mobility   Mobility Status  Independent      Written Expression   Dominant Hand  Right      Cognition   Overall Cognitive Status  Within Functional Limits for tasks assessed      Observation/Other Assessments   Observations  Pt presets in post op splint/cast.      Sensation   Light Touch  Appears Intact   Pt reports paraesthesias > at Noc     Additional Comments  Intact to LT at initial eval      Coordination   Gross Motor Movements are Fluid and Coordinated  Yes    Fine Motor Movements are Fluid and Coordinated  No               OT Treatments/Exercises (OP) - 09/15/19 0001      Splinting   Splinting  Pt bulky dressing was removed and he was fitted with a custon static volar protective splint. Pt and his daughter were educated in splinting use, care and precautions and written instructions were issued as well. They both verbalized understanding in clinic. Pt was with noted Min-mod edema both volar and dorsal wrist. His incision was dry and w/o drainage noted except for old serosanguinous post-op drainage. He was also instructed in a HEP for tendon gliding, opposition, thumb flexion and hold at based of small finger, and gentle AROM for wrist flexion and extension left hand/wrist. He was educated in place and hold fist with splint on to encourage MCP flexion. He was also educated in active ROM and stretch for shoulder and elbow left.         WEARING SCHEDULE:  Wear splint at ALL times except for hygiene care (May remove splint for exercises and then immediately place back on ONLY if directed by the therapist)  PURPOSE:  To prevent movement and for protection until injury can heal  CARE OF SPLINT:  Keep splint away from heat sources including: stove, radiator or furnace, or a car in sunlight. The splint can melt and will no longer fit you properly  Keep away from pets and children  Clean the splint with rubbing alcohol 1time per day.  * During this time, make sure you also clean your hand/arm as instructed by your therapist and/or perform dressing changes as needed. Then dry hand/arm completely before replacing splint. (When cleaning hand/arm, keep it immobilized in same position until splint is replaced)  PRECAUTIONS/POTENTIAL PROBLEMS: *If you notice or experience increased pain, swelling, numbness, or a  lingering reddened area from the splint: Contact your therapist immediately by calling [phone]. You must wear the splint for protection, but we will  get you scheduled for adjustments as quickly as possible.  (If only straps or hooks need to be replaced and NO adjustments to the splint need to be made, just call the office ahead and let them know you are coming in)  If you have any medical concerns or signs of infection, please call your doctor immediately  Flexor Tendon Gliding (Active Full Fist)    Comience con los dedos estirados, doble todas las articulaciones completando un puo.  Repita _10 ___ veces. Haga __3-4__ sesiones por da.  Opposition (Active)    Junte la punta del pulgar con la punta de cada dedo formando una "O". Repita __10__ veces. Haga _3-4___ sesiones por da.  Wrist Flexion / Extension    Sostenga los codos pegados al cuerpo y doblados a 56, con las palmas hacia abajo. Doble la [employer] de modo que los dedos apunten [place]. Luego doble las [employer] de modo que los dedos apunten [place]. Repita _10___ veces/sesin. Haga __4-5__ sesiones/semana. Posicin de las manos: Pulgares hacia arriba   Copyright  VHI. All rights reserved.        WEARING SCHEDULE:  Wear splint at ALL times except for hygiene care (May remove splint for exercises and then immediately place back on ONLY if directed by the therapist)  PURPOSE:  To prevent movement and for protection until injury can heal  CARE OF SPLINT:  Keep splint away from heat sources including: stove, radiator or furnace, or a car in sunlight. The splint can melt and will no longer fit you properly  Keep away from pets and children  Clean the splint with rubbing alcohol 1 time per day.  * During this time, make sure you also clean your hand/arm as instructed by your therapist and/or perform dressing changes as needed. Then dry hand/arm completely before replacing splint. (When cleaning  hand/arm, keep it immobilized in same position until splint is replaced)  PRECAUTIONS/POTENTIAL PROBLEMS: *If you notice or experience increased pain, swelling, numbness, or a lingering reddened area from the splint: Contact your therapist immediately by calling [phone]. You must wear the splint for protection, but we will get you scheduled for adjustments as quickly as possible.  (If only straps or hooks need to be replaced and NO adjustments to the splint need to be made, just call the office ahead and let them know you are coming in)  If you have any medical concerns or signs of infection, please call your doctor immediately    Make sure you move your shoulder and elbow to increase flexibility and decrease stiffness.  AROM: Wrist Flexion / Extension    Doble activamente la mueca derecha hacia adelante y atrs lo ms posible. Repita __10__ veces por rutina.  Realice [id number] sesiones por da.  Flexor Tendon Gliding (Active Full Fist)    Comience con los dedos estirados, doble todas las articulaciones completando un puo. Repita _10___ veces. Haga __4-6__ sesiones por da.  Opposition (Active)    Junte la punta del pulgar con la punta de cada dedo formando una "O". Repita __10__ veces. Haga __4-6__ sesiones por da.  Copyright  VHI. All rights reserved.     OT Education - 09/15/19 0920    Education Details  Splinting use, care and precautions. HEP for shoulder, elbow, gentle AROM flex/extension wrist, tendon gliding, place and hold fist, active opposition of digits    Person(s) Educated  Patient;Child(ren)   Daughter [place]   Methods  Explanation;Demonstration;Verbal cues;Handout    Comprehension  Verbalized understanding  OT Short Term Goals - 09/15/19 0944      OT SHORT TERM GOAL #1   Title  Pt will be Mod I Splint use, care and precautions L UE    Baseline  Req Min VC's    Time  4    Period  Weeks    Status  New    Target Date  10/13/19       OT SHORT TERM GOAL #2   Title  Pt will be Mod I HEP L UE    Baseline  Min VC required    Time  4    Period  Weeks    Status  New    Target Date  10/13/19      OT SHORT TERM GOAL #3   Title  Pt will be Mod I edema control techniques L UE    Baseline  VC's required Min-mod    Time  4    Period  Weeks    Status  New    Target Date  10/13/19        OT Long Term Goals - 09/15/19 0946      OT LONG TERM GOAL #1   Title  Pt will be Mod I upgraded HEP L UE    Baseline  Min VC's    Time  8    Period  Weeks    Status  New    Target Date  11/10/19      OT LONG TERM GOAL #2   Title  Pt will be Mod I all light ADL's and IADL's as simulated in clinic and per pt report using L UE as non-dominant hand.    Baseline  Min A required    Time  8    Period  Weeks    Status  New    Target Date  11/10/19      OT LONG TERM GOAL #3   Title  Pt will demonstrated functional Active ROM L UE/wrist as seen by goniometer assessment and simulation of light work related tasks    Baseline  Unable    Time  8    Period  Weeks    Status  New    Target Date  11/10/19      OT LONG TERM GOAL #4   Title  Pt will demonstrate at least 15# or more of grip strength as seen by JAMAR assessment in preparation for ADL and work related tasks.    Baseline  Unable    Time  8    Period  Weeks    Status  New    Target Date  11/10/19            Plan - 09/15/19 0925    Clinical Impression Statement  Pt is a 74 RHD male s/p fall while taking out the trash on 08/21/2019 (ICD 10 # S52.343). He sustained a left displaced communituted intra-articular distal radius fracture and underwent ORIF on 09/03/2019 (. He presents for out-pt OT for protective splinting and home exercise program per Dr Grandville Silos. He is currently 12 days post-op. Pt bulky dressing was removed and he was fitted with a custon static volar protective splint. Pt and his daughter were educated in splinting use, care and precautions and written  instructions were issued as well. They both verbalized understanding in clinic. Pt was with noted Min-mod edema both volar and dorsal wrist. His incision was dry and w/o drainage noted except for old serosanguinous post-op drainage. He was  also instructed in a HEP for tendon gliding, opposition, thumb flexion and hold at based of small finger, and gentle AROM for wrist flexion and extension left hand/wrist. He was educated in place and hold fist with splint on to encourage MCP flexion. He was also educated in active ROM and stretch for shoulder and elbow left. He will benefit from continued out-pt OT to address edema, decreased active ROM, sensibility changes, splinting and pt/family education. He plans to f/u with Dr Grandville Silos on 09/16/2019.    OT Occupational Profile and History  Problem Focused Assessment - Including review of records relating to presenting problem    Occupational performance deficits (Please refer to evaluation for details):  ADL's;IADL's    Body Structure / Function / Physical Skills  ADL;ROM;Edema;Scar mobility;Sensation;Mobility;Strength;Coordination;FMC;Pain;UE functional use    Rehab Potential  Good    Clinical Decision Making  Limited treatment options, no task modification necessary    Comorbidities Affecting Occupational Performance:  May have comorbidities impacting occupational performance    Modification or Assistance to Complete Evaluation   Min-Moderate modification of tasks or assist with assess necessary to complete eval    OT Frequency  1x / week    OT Duration  8 weeks    OT Treatment/Interventions  Self-care/ADL training;Fluidtherapy;Splinting;Therapeutic activities;Therapeutic exercise;Scar mobilization;Passive range of motion;Paraffin;Manual Therapy;Patient/family education    Plan  Splint check and adjustments, review and upgrade HEP L    Consulted and Agree with Plan of Care  Patient;Family member/caregiver    Family Member Consulted  Daughter - [place]        Patient will benefit from skilled therapeutic intervention in order to improve the following deficits and impairments:   Body Structure / Function / Physical Skills: ADL, ROM, Edema, Scar mobility, Sensation, Mobility, Strength, Coordination, FMC, Pain, UE functional use       Visit Diagnosis: Localized edema - Plan: Ot plan of care cert/re-cert  Pain in left wrist - Plan: Ot plan of care cert/re-cert  Muscle weakness (generalized) - Plan: Ot plan of care cert/re-cert  Other lack of coordination - Plan: Ot plan of care cert/re-cert    Problem List Patient Active Problem List   Diagnosis Date Noted  . Atrial fibrillation ([facility]) 10/21/2010  . Diabetes mellitus 10/21/2010  . Hypertension 10/21/2010  . Chronic kidney disease (CKD), stage IV (severe) ([facility]) 10/21/2010  . ABDOMINAL WALL HERNIA 06/20/2010    Carlynn Herald Kailene Steinhart Ardath Sax, OTR/L 09/15/2019, 9:56 AM  [facility] [address] [place], [place], [address] Phone: [phone]   Fax:  [phone]  Name: [name] MRN: [id number] Date of Birth: Jan 12, 1951

## 2019-09-15 NOTE — Patient Instructions (Addendum)
WEARING SCHEDULE:  Wear splint at ALL times except for hygiene care (May remove splint for exercises and then immediately place back on ONLY if directed by the therapist)  PURPOSE:  To prevent movement and for protection until injury can heal  CARE OF SPLINT:  Keep splint away from heat sources including: stove, radiator or furnace, or a car in sunlight. The splint can melt and will no longer fit you properly  Keep away from pets and children  Clean the splint with rubbing alcohol 1time per day.  * During this time, make sure you also clean your hand/arm as instructed by your therapist and/or perform dressing changes as needed. Then dry hand/arm completely before replacing splint. (When cleaning hand/arm, keep it immobilized in same position until splint is replaced)  PRECAUTIONS/POTENTIAL PROBLEMS: *If you notice or experience increased pain, swelling, numbness, or a lingering reddened area from the splint: Contact your therapist immediately by calling [phone]. You must wear the splint for protection, but we will get you scheduled for adjustments as quickly as possible.  (If only straps or hooks need to be replaced and NO adjustments to the splint need to be made, just call the office ahead and let them know you are coming in)  If you have any medical concerns or signs of infection, please call your doctor immediately  Flexor Tendon Gliding (Active Full Fist)    Comience con los dedos estirados, doble todas las articulaciones completando un puo.  Repita _10 ___ veces. Haga __3-4__ sesiones por da.  Opposition (Active)    Junte la punta del pulgar con la punta de cada dedo formando una "O". Repita __10__ veces. Haga _3-4___ sesiones por da.  Wrist Flexion / Extension    Sostenga los codos pegados al cuerpo y doblados a 13, con las palmas hacia abajo. Doble la [employer] de modo que los dedos apunten [place]. Luego doble las [employer] de modo que los dedos apunten  [place]. Repita _10___ veces/sesin. Haga __4-5__ sesiones/semana. Posicin de las manos: Pulgares hacia arriba   Copyright  VHI. All rights reserved.        WEARING SCHEDULE:  Wear splint at ALL times except for hygiene care (May remove splint for exercises and then immediately place back on ONLY if directed by the therapist)  PURPOSE:  To prevent movement and for protection until injury can heal  CARE OF SPLINT:  Keep splint away from heat sources including: stove, radiator or furnace, or a car in sunlight. The splint can melt and will no longer fit you properly  Keep away from pets and children  Clean the splint with rubbing alcohol 1 time per day.  * During this time, make sure you also clean your hand/arm as instructed by your therapist and/or perform dressing changes as needed. Then dry hand/arm completely before replacing splint. (When cleaning hand/arm, keep it immobilized in same position until splint is replaced)  PRECAUTIONS/POTENTIAL PROBLEMS: *If you notice or experience increased pain, swelling, numbness, or a lingering reddened area from the splint: Contact your therapist immediately by calling [phone]. You must wear the splint for protection, but we will get you scheduled for adjustments as quickly as possible.  (If only straps or hooks need to be replaced and NO adjustments to the splint need to be made, just call the office ahead and let them know you are coming in)  If you have any medical concerns or signs of infection, please call your doctor immediately    Make sure  you move your shoulder and elbow to increase flexibility and decrease stiffness.  AROM: Wrist Flexion / Extension    Doble activamente la mueca derecha hacia adelante y atrs lo ms posible. Repita __10__ veces por rutina.  Realice [id number] sesiones por da.  Flexor Tendon Gliding (Active Full Fist)    Comience con los dedos estirados, doble todas las articulaciones  completando un puo. Repita _10___ veces. Haga __4-6__ sesiones por da.  Opposition (Active)    Junte la punta del pulgar con la punta de cada dedo formando una "O". Repita __10__ veces. Haga __4-6__ sesiones por da.  Copyright  VHI. All rights reserved.

## 2019-09-22 ENCOUNTER — Ambulatory Visit: Payer: Self-pay | Attending: Orthopedic Surgery | Admitting: Occupational Therapy

## 2021-03-04 IMAGING — DX DG WRIST COMPLETE 3+V*L*
3 series · 3 of 3 positions shown · non-contrast
Comparison: None.

CLINICAL DATA: Fall.  Pain.

EXAM:
LEFT WRIST - COMPLETE 3+ VIEW

[wrist pa]
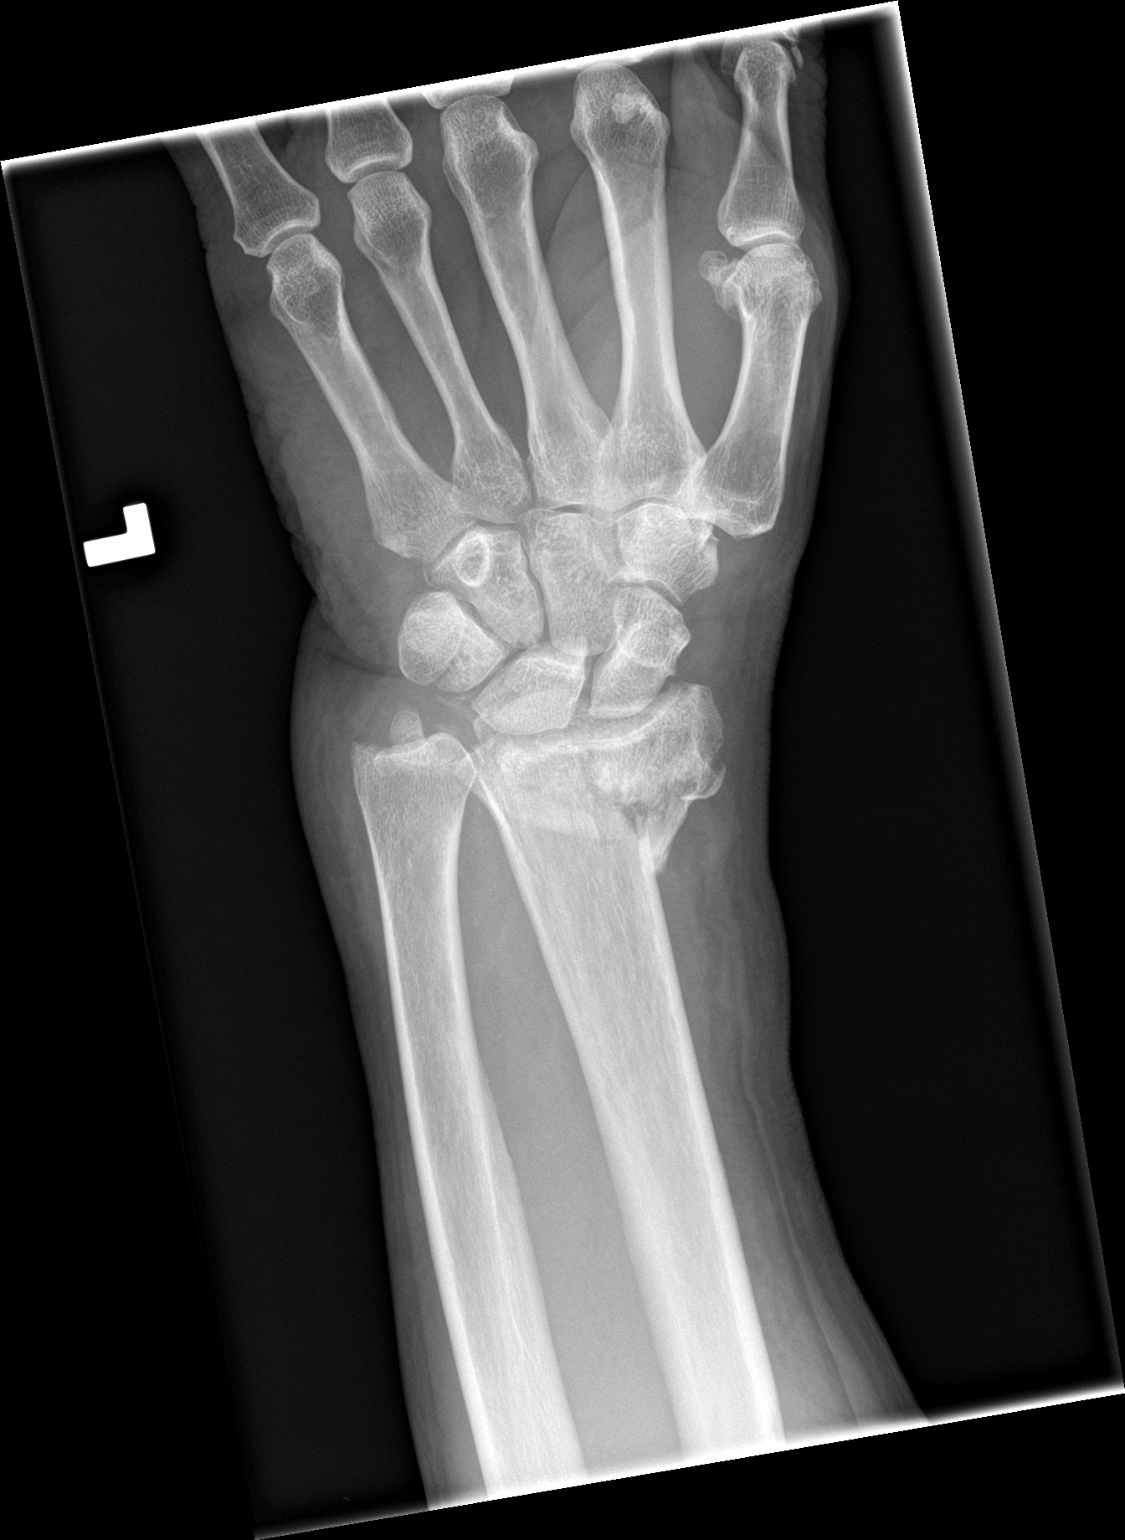

[wrist obl]
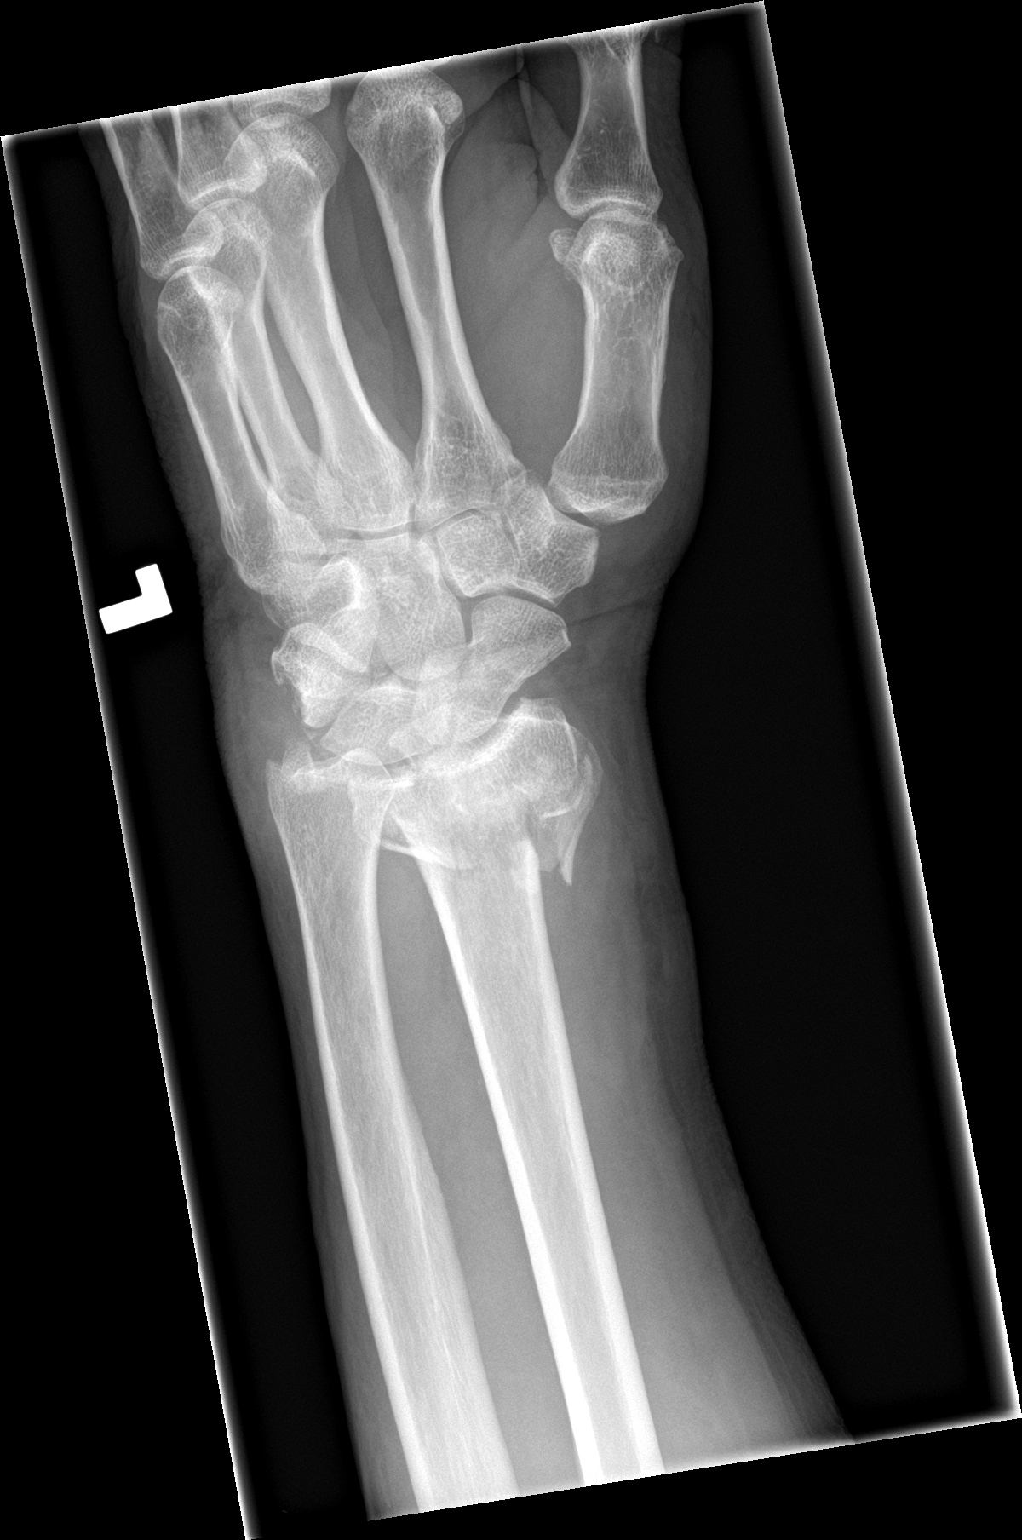

[wrist lat]
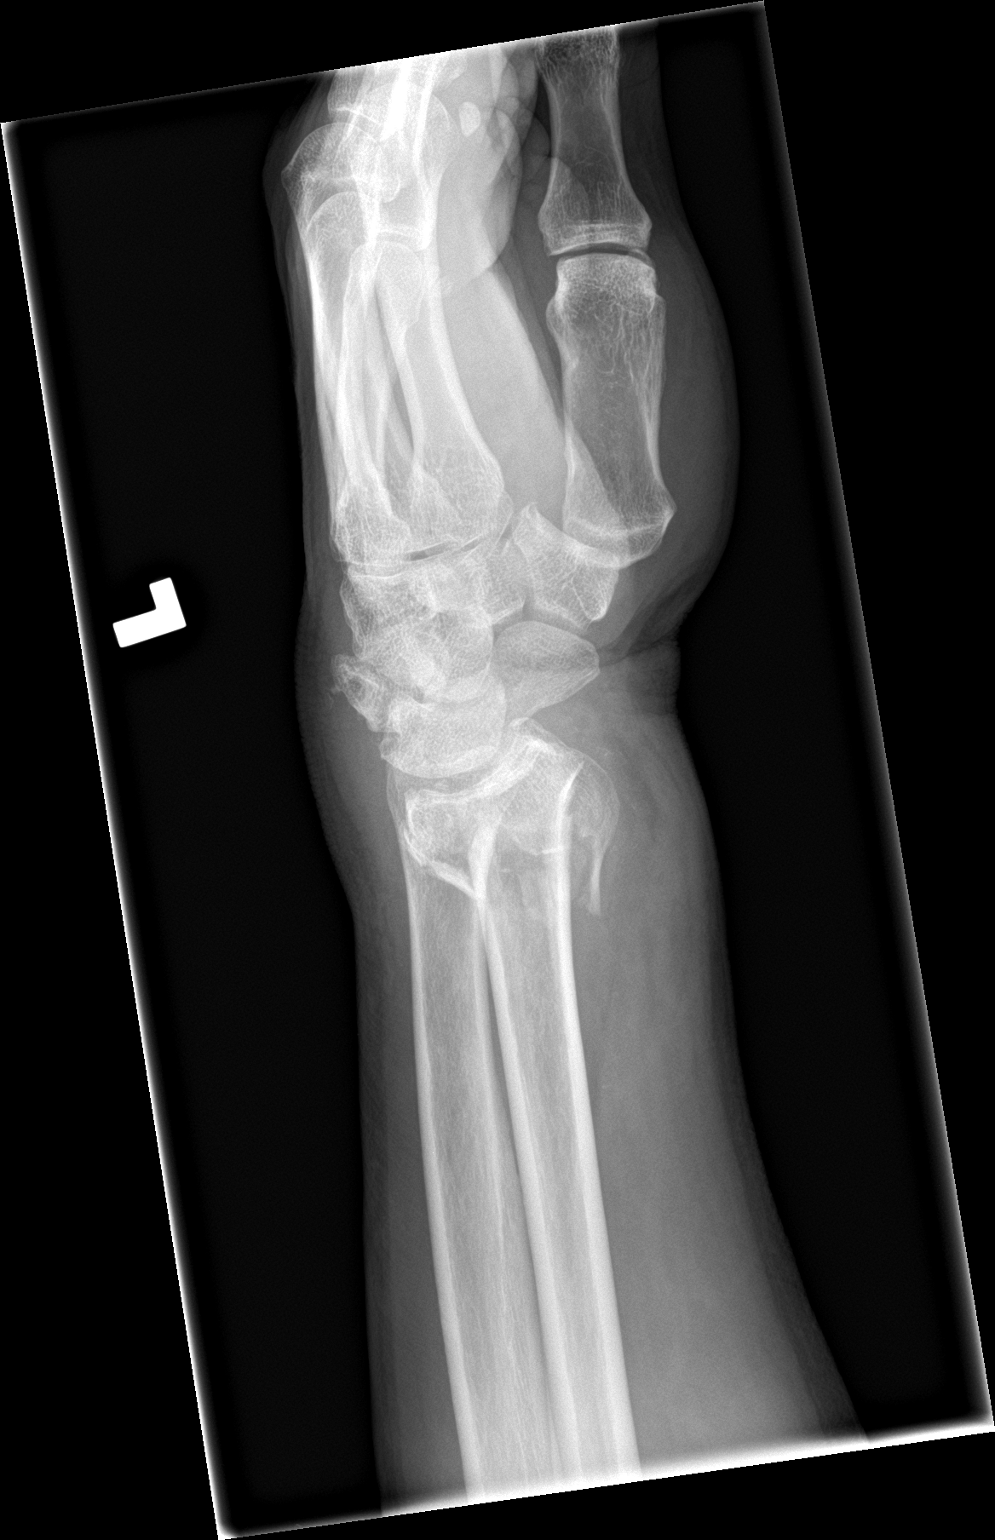

[3 of 3 positions shown; findings below may reference images not displayed]

FINDINGS: Comminuted, impacted distal radius fracture with intra-articular
extension. Dorsal displacement of distal fracture fragments. Ulnar
styloid displaced fracture. Soft tissue swelling.
IMPRESSION: Impacted comminuted distal radius fracture with intra-articular
extension.

Ulnar styloid fracture.

## 2022-12-05 ENCOUNTER — Other Ambulatory Visit: Payer: Self-pay

## 2022-12-05 ENCOUNTER — Emergency Department (HOSPITAL_COMMUNITY)
Admission: EM | Admit: 2022-12-05 | Discharge: 2022-12-06 | Disposition: A | Discharge status: Home / Self Care | Payer: Self-pay | Attending: Emergency Medicine | Admitting: Emergency Medicine

## 2022-12-05 DIAGNOSIS — N189 Chronic kidney disease, unspecified: Secondary | ICD-10-CM | POA: Insufficient documentation

## 2022-12-05 DIAGNOSIS — M7989 Other specified soft tissue disorders: Secondary | ICD-10-CM | POA: Insufficient documentation

## 2022-12-05 LAB — BASIC METABOLIC PANEL
Anion gap: 12 (ref 5–15)
BUN: 79 mg/dL — ABNORMAL HIGH (ref 8–23)
CO2: 18 mmol/L — ABNORMAL LOW (ref 22–32)
Calcium: 8 mg/dL — ABNORMAL LOW (ref 8.9–10.3)
Chloride: 108 mmol/L (ref 98–111)
Creatinine, Ser: 3.79 mg/dL — ABNORMAL HIGH (ref 0.61–1.24)
GFR, Estimated: 16 mL/min — ABNORMAL LOW (ref >60)
Glucose, Bld: 76 mg/dL (ref 70–99)
Potassium: 5.7 mmol/L — ABNORMAL HIGH (ref 3.5–5.1)
Sodium: 138 mmol/L (ref 135–145)

## 2022-12-05 LAB — CBC
HCT: 34.3 % — ABNORMAL LOW (ref 39.0–52.0)
Hemoglobin: 10.9 g/dL — ABNORMAL LOW (ref 13.0–17.0)
MCH: 30 pg (ref 26.0–34.0)
MCHC: 31.8 g/dL (ref 30.0–36.0)
MCV: 94.5 fL (ref 80.0–100.0)
Platelets: 417 10*3/uL — ABNORMAL HIGH (ref 150–400)
RBC: 3.63 MIL/uL — ABNORMAL LOW (ref 4.22–5.81)
RDW: 12.3 % (ref 11.5–15.5)
WBC: 7.8 10*3/uL (ref 4.0–10.5)
nRBC: 0 % (ref 0.0–0.2)

## 2022-12-05 NOTE — ED Provider Triage Note (Signed)
Emergency Medicine Provider Triage Evaluation Note  [name] , a 72 y.o. male  was evaluated in triage.  Pt complains of abnormal lab/right lower extremity swelling.  Patient states that he has been following with primary care outpatient with routine lab drawn approximately 3 months ago which showed GFR of around 19.  Reports having most recent GFR of 14.  Reports 2 to 3 weeks of right lower extremity swelling.  States he has been trying to set up appointment with nephrology but has no insurance.  Denies chest pain, shortness of breath, Abdo pain, nausea, vomiting, urinary symptoms, change in bowel habits.  Review of Systems  Positive: See above Negative:   Physical Exam  BP (!) 157/81 (BP Location: Left Arm)   Pulse 80   Temp 97.7 F (36.5 C) (Oral)   Resp 16   Ht 5\' 11"  (1.803 m)   Wt 98.4 kg   SpO2 98%   BMI 30.27 kg/m  Gen:   Awake, no distress  Resp:  Normal effort  MSK:   Moves extremities without difficulty  Other:  Right lower extremity edema  Medical Decision Making  Medically screening exam initiated at 6:58 PM.  Appropriate orders placed.  [name] was informed that the remainder of the evaluation will be completed by another provider, this initial triage assessment does not replace that evaluation, and the importance of remaining in the ED until their evaluation is complete.     Peter Garter, [place] 12/05/22 1901

## 2022-12-05 NOTE — ED Triage Notes (Signed)
Pt came in via POV d/t his PCP sending him here d/t his lab work showing his GFR was 14. He has also had a swollen Rt foot for the past 2-3 weeks & rates his intermittent pain in that 8/10. A/Ox4.

## 2022-12-06 ENCOUNTER — Emergency Department (HOSPITAL_COMMUNITY): Payer: Self-pay

## 2022-12-06 ENCOUNTER — Ambulatory Visit (HOSPITAL_COMMUNITY)
Admission: RE | Admit: 2022-12-06 | Discharge: 2022-12-06 | Disposition: A | Discharge status: Home / Self Care | Payer: Self-pay | Source: Ambulatory Visit | Attending: Emergency Medicine | Admitting: Emergency Medicine

## 2022-12-06 DIAGNOSIS — R609 Edema, unspecified: Secondary | ICD-10-CM

## 2022-12-06 DIAGNOSIS — M7989 Other specified soft tissue disorders: Secondary | ICD-10-CM | POA: Insufficient documentation

## 2022-12-06 DIAGNOSIS — M79671 Pain in right foot: Secondary | ICD-10-CM | POA: Insufficient documentation

## 2022-12-06 DIAGNOSIS — N189 Chronic kidney disease, unspecified: Secondary | ICD-10-CM | POA: Insufficient documentation

## 2022-12-06 MED ORDER — SODIUM ZIRCONIUM CYCLOSILICATE 10 G PO PACK
10.0000 g | PACK | Freq: Once | ORAL | Status: AC
  Administered 2022-12-06: 10 g via ORAL
  Filled 2022-12-06: qty 1

## 2022-12-06 NOTE — Discharge Instructions (Addendum)
You were evaluated in the Emergency Department and after careful evaluation, we did not find any emergent condition requiring admission or further testing in the hospital.  Your exam/testing today was overall reassuring.  We recommend follow-up with a nephrologist to discuss management of your kidney disease.  We also recommend returning for an ultrasound of your leg to make sure there is no blood clot.  Try to avoid potassium in the diet.  Please return to the Emergency Department if you experience any worsening of your condition.  Thank you for allowing [place] to be a part of your care.

## 2022-12-06 NOTE — ED Provider Notes (Signed)
[facility] Emergency Department Provider Note MRN:  [id number]  Arrival date & time: 12/06/22     Chief Complaint   Abnormal Labs and Swollen Rt foot   History of Present Illness   [name] is a 72 y.o. year-old male with a history of CKD presenting to the ED with chief complaint of abnormal labs.  Patient's doctors encouraging him to seek further care for his kidney function results.  Downtrending GFR over the past few weeks.  Also has a painful and swollen right foot/right lower leg.  Denies any other complaints.  Review of Systems  A thorough review of systems was obtained and all systems are negative except as noted in the HPI and PMH.   Patient's Health History   No past medical history on file.    No family history on file.  Social History   Socioeconomic History   Marital status: Married    Spouse name: Not on file   Number of children: Not on file   Years of education: Not on file   Highest education level: Not on file  Occupational History   Not on file  Tobacco Use   Smoking status: Not on file   Smokeless tobacco: Not on file  Substance and Sexual Activity   Alcohol use: Not on file   Drug use: Not on file   Sexual activity: Not on file  Other Topics Concern   Not on file  Social History Narrative   Not on file   Social Determinants of Health   Financial Resource Strain: Not on file  Food Insecurity: Not on file  Transportation Needs: Not on file  Physical Activity: Not on file  Stress: Not on file  Social Connections: Not on file  Intimate Partner Violence: Not on file     Physical Exam   Vitals:   12/05/22 2230 12/06/22 0158  BP: 120/84 121/85  Pulse: 76 79  Resp: 16 14  Temp: 97.6 F (36.4 C) 97.6 F (36.4 C)  SpO2: 100% 100%    CONSTITUTIONAL: Well-appearing, NAD NEURO/PSYCH:  Alert and oriented x 3, no focal deficits EYES:  eyes equal and reactive ENT/NECK:  no LAD, no JVD CARDIO: Regular  rate, well-perfused, normal S1 and S2 PULM:  CTAB no wheezing or rhonchi GI/GU:  non-distended, non-tender MSK/SPINE:  No gross deformities, no edema SKIN:  no rash, atraumatic   *Additional and/or pertinent findings included in MDM below  Diagnostic and Interventional Summary    EKG Interpretation  Date/Time:    Ventricular Rate:    PR Interval:    QRS Duration:   QT Interval:    QTC Calculation:   R Axis:     Text Interpretation:         Labs Reviewed  BASIC METABOLIC PANEL - Abnormal; Notable for the following components:      Result Value   Potassium 5.7 (*)    CO2 18 (*)    BUN 79 (*)    Creatinine, Ser 3.79 (*)    Calcium 8.0 (*)    GFR, Estimated 16 (*)    All other components within normal limits  CBC - Abnormal; Notable for the following components:   RBC 3.63 (*)    Hemoglobin 10.9 (*)    HCT 34.3 (*)    Platelets 417 (*)    All other components within normal limits  URINALYSIS, ROUTINE W REFLEX MICROSCOPIC    DG Ankle Complete Right  Final Result  CT ABDOMEN PELVIS WO CONTRAST  Final Result    VAS [place] LOWER EXTREMITY VENOUS (DVT) (7a-7p)    (Results Pending)  LE VENOUS    (Results Pending)    Medications  sodium zirconium cyclosilicate (LOKELMA) packet 10 g (10 g Oral Given 12/06/22 0114)     Procedures  /  Critical Care Procedures  ED Course and Medical Decision Making  Initial Impression and Ddx The foot does have swelling to the foot and ankle, mild tenderness but completely normal range of motion, mild tenderness to the right calf.  DVT is considered.  No signs of infection, doubt gout.  Past medical/surgical history that increases complexity of ED encounter: CKD  Interpretation of Diagnostics I personally reviewed the laboratory assessment and my interpretation is as follows: Impaired renal function with potassium of 5.7  CT abdomen pelvis is unremarkable, x-ray of the ankle is normal  Patient Reassessment and Ultimate  Disposition/Management     Patient is sitting comfortably in no acute distress, currently no indication for emergent dialysis.  Referred to nephrology, also encouraged to return for ultrasound of the leg in the morning.  Patient management required discussion with the following services or consulting groups:  None  Complexity of Problems Addressed Acute illness or injury that poses threat of life of bodily function  Additional Data Reviewed and Analyzed Further history obtained from: Further history from spouse/family member  Additional Factors Impacting ED Encounter Risk Consideration of hospitalization  Elmer Sow. Pilar Plate, MD [facility] Emergency Medicine [facility] [email or web address]  Final Clinical Impressions(s) / ED Diagnoses     ICD-10-CM   1. Foot swelling  M79.89       ED Discharge Orders          Ordered    LE VENOUS        12/06/22 0141             Discharge Instructions Discussed with and Provided to Patient:    Discharge Instructions      You were evaluated in the Emergency Department and after careful evaluation, we did not find any emergent condition requiring admission or further testing in the hospital.  Your exam/testing today was overall reassuring.  We recommend follow-up with a nephrologist to discuss management of your kidney disease.  We also recommend returning for an ultrasound of your leg to make sure there is no blood clot.  Try to avoid potassium in the diet.  Please return to the Emergency Department if you experience any worsening of your condition.  Thank you for allowing [place] to be a part of your care.       Sabas Sous, MD 12/06/22 [phone]

## 2023-01-20 ENCOUNTER — Encounter (HOSPITAL_COMMUNITY): Payer: Self-pay

## 2023-01-22 ENCOUNTER — Inpatient Hospital Stay (HOSPITAL_COMMUNITY): Payer: MEDICAID

## 2023-01-22 ENCOUNTER — Inpatient Hospital Stay (HOSPITAL_COMMUNITY)
Admit: 2023-01-22 | Discharge: 2023-02-03 | DRG: 264 | Disposition: A | Discharge status: Home / Self Care | Payer: MEDICAID | Source: Other Acute Inpatient Hospital | Attending: Internal Medicine | Admitting: Internal Medicine

## 2023-01-22 ENCOUNTER — Other Ambulatory Visit (HOSPITAL_COMMUNITY): Payer: Self-pay

## 2023-01-22 ENCOUNTER — Encounter (HOSPITAL_COMMUNITY): Payer: Self-pay

## 2023-01-22 DIAGNOSIS — N19 Unspecified kidney failure: Principal | ICD-10-CM

## 2023-01-22 DIAGNOSIS — Z7901 Long term (current) use of anticoagulants: Secondary | ICD-10-CM

## 2023-01-22 DIAGNOSIS — N186 End stage renal disease: Secondary | ICD-10-CM | POA: Diagnosis present

## 2023-01-22 DIAGNOSIS — I161 Hypertensive emergency: Secondary | ICD-10-CM | POA: Diagnosis present

## 2023-01-22 DIAGNOSIS — Z79890 Hormone replacement therapy (postmenopausal): Secondary | ICD-10-CM

## 2023-01-22 DIAGNOSIS — R0609 Other forms of dyspnea: Secondary | ICD-10-CM | POA: Insufficient documentation

## 2023-01-22 DIAGNOSIS — N184 Chronic kidney disease, stage 4 (severe): Secondary | ICD-10-CM

## 2023-01-22 DIAGNOSIS — N179 Acute kidney failure, unspecified: Secondary | ICD-10-CM | POA: Diagnosis present

## 2023-01-22 DIAGNOSIS — Z91199 Patient's noncompliance with other medical treatment and regimen due to unspecified reason: Secondary | ICD-10-CM | POA: Diagnosis not present

## 2023-01-22 DIAGNOSIS — E119 Type 2 diabetes mellitus without complications: Secondary | ICD-10-CM

## 2023-01-22 DIAGNOSIS — D631 Anemia in chronic kidney disease: Secondary | ICD-10-CM | POA: Diagnosis present

## 2023-01-22 DIAGNOSIS — E872 Acidosis, unspecified: Secondary | ICD-10-CM | POA: Diagnosis present

## 2023-01-22 DIAGNOSIS — Z79899 Other long term (current) drug therapy: Secondary | ICD-10-CM | POA: Diagnosis not present

## 2023-01-22 DIAGNOSIS — G9341 Metabolic encephalopathy: Secondary | ICD-10-CM

## 2023-01-22 DIAGNOSIS — I69354 Hemiplegia and hemiparesis following cerebral infarction affecting left non-dominant side: Secondary | ICD-10-CM

## 2023-01-22 DIAGNOSIS — Z8673 Personal history of transient ischemic attack (TIA), and cerebral infarction without residual deficits: Secondary | ICD-10-CM

## 2023-01-22 DIAGNOSIS — Z7982 Long term (current) use of aspirin: Secondary | ICD-10-CM | POA: Diagnosis not present

## 2023-01-22 DIAGNOSIS — Z8249 Family history of ischemic heart disease and other diseases of the circulatory system: Secondary | ICD-10-CM | POA: Diagnosis not present

## 2023-01-22 DIAGNOSIS — I4891 Unspecified atrial fibrillation: Secondary | ICD-10-CM | POA: Diagnosis present

## 2023-01-22 DIAGNOSIS — I480 Paroxysmal atrial fibrillation: Secondary | ICD-10-CM | POA: Diagnosis present

## 2023-01-22 DIAGNOSIS — I160 Hypertensive urgency: Secondary | ICD-10-CM

## 2023-01-22 DIAGNOSIS — I5033 Acute on chronic diastolic (congestive) heart failure: Secondary | ICD-10-CM | POA: Diagnosis present

## 2023-01-22 DIAGNOSIS — I10 Essential (primary) hypertension: Secondary | ICD-10-CM | POA: Diagnosis present

## 2023-01-22 DIAGNOSIS — J810 Acute pulmonary edema: Secondary | ICD-10-CM

## 2023-01-22 DIAGNOSIS — I4892 Unspecified atrial flutter: Secondary | ICD-10-CM | POA: Diagnosis not present

## 2023-01-22 DIAGNOSIS — E039 Hypothyroidism, unspecified: Secondary | ICD-10-CM | POA: Diagnosis present

## 2023-01-22 DIAGNOSIS — Z7984 Long term (current) use of oral hypoglycemic drugs: Secondary | ICD-10-CM

## 2023-01-22 DIAGNOSIS — E1122 Type 2 diabetes mellitus with diabetic chronic kidney disease: Secondary | ICD-10-CM | POA: Diagnosis present

## 2023-01-22 DIAGNOSIS — I132 Hypertensive heart and chronic kidney disease with heart failure and with stage 5 chronic kidney disease, or end stage renal disease: Secondary | ICD-10-CM | POA: Diagnosis present

## 2023-01-22 DIAGNOSIS — J9601 Acute respiratory failure with hypoxia: Secondary | ICD-10-CM | POA: Diagnosis present

## 2023-01-22 DIAGNOSIS — Z992 Dependence on renal dialysis: Secondary | ICD-10-CM | POA: Diagnosis not present

## 2023-01-22 DIAGNOSIS — I272 Pulmonary hypertension, unspecified: Secondary | ICD-10-CM | POA: Diagnosis present

## 2023-01-22 DIAGNOSIS — E785 Hyperlipidemia, unspecified: Secondary | ICD-10-CM | POA: Diagnosis present

## 2023-01-22 LAB — COMPREHENSIVE METABOLIC PANEL
ALT: 21 U/L (ref 0–44)
AST: 20 U/L (ref 15–41)
Albumin: 2.7 g/dL — ABNORMAL LOW (ref 3.5–5.0)
Alkaline Phosphatase: 74 U/L (ref 38–126)
Anion gap: 12 (ref 5–15)
BUN: 85 mg/dL — ABNORMAL HIGH (ref 8–23)
CO2: 12 mmol/L — ABNORMAL LOW (ref 22–32)
Calcium: 7.3 mg/dL — ABNORMAL LOW (ref 8.9–10.3)
Chloride: 116 mmol/L — ABNORMAL HIGH (ref 98–111)
Creatinine, Ser: 6.07 mg/dL — ABNORMAL HIGH (ref 0.61–1.24)
GFR, Estimated: 9 mL/min — ABNORMAL LOW (ref >60)
Glucose, Bld: 45 mg/dL — ABNORMAL LOW (ref 70–99)
Potassium: 4.1 mmol/L (ref 3.5–5.1)
Sodium: 140 mmol/L (ref 135–145)
Total Bilirubin: 0.7 mg/dL (ref 0.3–1.2)
Total Protein: 6.1 g/dL — ABNORMAL LOW (ref 6.5–8.1)

## 2023-01-22 LAB — APTT
aPTT: 38 seconds — ABNORMAL HIGH (ref 24–36)
aPTT: 47 seconds — ABNORMAL HIGH (ref 24–36)

## 2023-01-22 LAB — URINALYSIS, ROUTINE W REFLEX MICROSCOPIC
Bilirubin Urine: NEGATIVE
Glucose, UA: NEGATIVE mg/dL
Ketones, ur: NEGATIVE mg/dL
Leukocytes,Ua: NEGATIVE
Nitrite: NEGATIVE
Protein, ur: >=300 mg/dL — AB
Specific Gravity, Urine: 1.008 (ref 1.005–1.030)
pH: 5 (ref 5.0–8.0)

## 2023-01-22 LAB — CBC WITH DIFFERENTIAL/PLATELET
Abs Immature Granulocytes: 0.04 10*3/uL (ref 0.00–0.07)
Basophils Absolute: 0.1 10*3/uL (ref 0.0–0.1)
Basophils Relative: 1 %
Eosinophils Absolute: 0.1 10*3/uL (ref 0.0–0.5)
Eosinophils Relative: 1 %
HCT: 28.3 % — ABNORMAL LOW (ref 39.0–52.0)
Hemoglobin: 9.2 g/dL — ABNORMAL LOW (ref 13.0–17.0)
Immature Granulocytes: 0 %
Lymphocytes Relative: 15 %
Lymphs Abs: 1.4 10*3/uL (ref 0.7–4.0)
MCH: 30.3 pg (ref 26.0–34.0)
MCHC: 32.5 g/dL (ref 30.0–36.0)
MCV: 93.1 fL (ref 80.0–100.0)
Monocytes Absolute: 0.6 10*3/uL (ref 0.1–1.0)
Monocytes Relative: 7 %
Neutro Abs: 7 10*3/uL (ref 1.7–7.7)
Neutrophils Relative %: 76 %
Platelets: 209 10*3/uL (ref 150–400)
RBC: 3.04 MIL/uL — ABNORMAL LOW (ref 4.22–5.81)
RDW: 13.1 % (ref 11.5–15.5)
WBC: 9.1 10*3/uL (ref 4.0–10.5)
nRBC: 0 % (ref 0.0–0.2)

## 2023-01-22 LAB — GLUCOSE, CAPILLARY
Glucose-Capillary: 35 mg/dL — CL (ref 70–99)
Glucose-Capillary: 125 mg/dL — ABNORMAL HIGH (ref 70–99)
Glucose-Capillary: 53 mg/dL — ABNORMAL LOW (ref 70–99)
Glucose-Capillary: 99 mg/dL (ref 70–99)
Glucose-Capillary: 139 mg/dL — ABNORMAL HIGH (ref 70–99)
Glucose-Capillary: 139 mg/dL — ABNORMAL HIGH (ref 70–99)

## 2023-01-22 LAB — HEMOGLOBIN A1C
Hgb A1c MFr Bld: 5.6 % (ref 4.8–5.6)
Mean Plasma Glucose: 114.02 mg/dL

## 2023-01-22 LAB — IRON AND TIBC
Iron: 44 ug/dL — ABNORMAL LOW (ref 45–182)
Saturation Ratios: 20 % (ref 17.9–39.5)
TIBC: 216 ug/dL — ABNORMAL LOW (ref 250–450)
UIBC: 172 ug/dL

## 2023-01-22 LAB — TROPONIN I (HIGH SENSITIVITY)
Troponin I (High Sensitivity): 35 ng/L — ABNORMAL HIGH (ref <18)
Troponin I (High Sensitivity): 40 ng/L — ABNORMAL HIGH (ref <18)

## 2023-01-22 LAB — MAGNESIUM: Magnesium: 1.8 mg/dL (ref 1.7–2.4)

## 2023-01-22 LAB — HEPARIN LEVEL (UNFRACTIONATED): Heparin Unfractionated: >1.1 IU/mL — ABNORMAL HIGH (ref 0.30–0.70)

## 2023-01-22 LAB — BRAIN NATRIURETIC PEPTIDE: B Natriuretic Peptide: 1135.7 pg/mL — ABNORMAL HIGH (ref 0.0–100.0)

## 2023-01-22 LAB — LACTIC ACID, PLASMA
Lactic Acid, Venous: 0.7 mmol/L (ref 0.5–1.9)
Lactic Acid, Venous: 0.7 mmol/L (ref 0.5–1.9)

## 2023-01-22 LAB — PROTIME-INR
INR: 1.3 — ABNORMAL HIGH (ref 0.8–1.2)
Prothrombin Time: 16.8 seconds — ABNORMAL HIGH (ref 11.4–15.2)

## 2023-01-22 MED ORDER — ONDANSETRON HCL 4 MG/2ML IJ SOLN: 4.0000 mg | Freq: Four times a day (QID) | INTRAMUSCULAR | Status: DC | PRN

## 2023-01-22 MED ORDER — GLUCAGON HCL RDNA (DIAGNOSTIC) 1 MG IJ SOLR: 1.0000 mg | Freq: Once | INTRAMUSCULAR | Status: DC | PRN

## 2023-01-22 MED ORDER — TRAZODONE HCL 50 MG PO TABS
50.0000 mg | ORAL_TABLET | Freq: Every evening | ORAL | Status: DC | PRN
  Filled 2023-01-22: qty 1

## 2023-01-22 MED ORDER — SENNOSIDES-DOCUSATE SODIUM 8.6-50 MG PO TABS: 1.0000 | ORAL_TABLET | Freq: Every evening | ORAL | Status: DC | PRN

## 2023-01-22 MED ORDER — ENSURE ENLIVE PO LIQD
237.0000 mL | Freq: Two times a day (BID) | ORAL | Status: DC
  Administered 2023-01-23 – 2023-01-26 (×7): 237 mL via ORAL

## 2023-01-22 MED ORDER — METOPROLOL TARTRATE 5 MG/5ML IV SOLN
5.0000 mg | INTRAVENOUS | Status: DC | PRN
  Administered 2023-01-22: 5 mg via INTRAVENOUS
  Filled 2023-01-22: qty 5

## 2023-01-22 MED ORDER — HEPARIN (PORCINE) 25000 UT/250ML-% IV SOLN
2000.0000 [IU]/h | INTRAVENOUS | Status: DC
  Administered 2023-01-22: 1250 [IU]/h via INTRAVENOUS
  Administered 2023-01-23: 1550 [IU]/h via INTRAVENOUS
  Administered 2023-01-23: 1450 [IU]/h via INTRAVENOUS
  Administered 2023-01-24: 1550 [IU]/h via INTRAVENOUS
  Administered 2023-01-25: 1700 [IU]/h via INTRAVENOUS
  Administered 2023-01-26 – 2023-01-27 (×2): 1600 [IU]/h via INTRAVENOUS
  Administered 2023-01-27: 1850 [IU]/h via INTRAVENOUS
  Filled 2023-01-22 (×8): qty 250

## 2023-01-22 MED ORDER — DEXTROSE 50 % IV SOLN
25.0000 mL | Freq: Once | INTRAVENOUS | Status: AC
  Administered 2023-01-22: 25 mL via INTRAVENOUS

## 2023-01-22 MED ORDER — ACETAMINOPHEN 325 MG PO TABS
650.0000 mg | ORAL_TABLET | Freq: Four times a day (QID) | ORAL | Status: DC | PRN
  Administered 2023-01-24 – 2023-01-26 (×2): 650 mg via ORAL
  Filled 2023-01-22 (×3): qty 2

## 2023-01-22 MED ORDER — INSULIN ASPART 100 UNIT/ML IJ SOLN
0.0000 [IU] | Freq: Every day | INTRAMUSCULAR | Status: DC
  Administered 2023-01-27: 2 [IU] via SUBCUTANEOUS
  Administered 2023-01-30: 3 [IU] via SUBCUTANEOUS
  Administered 2023-01-31 – 2023-02-01 (×2): 2 [IU] via SUBCUTANEOUS

## 2023-01-22 MED ORDER — HYDRALAZINE HCL 20 MG/ML IJ SOLN
10.0000 mg | INTRAMUSCULAR | Status: DC | PRN
  Administered 2023-01-22: 10 mg via INTRAVENOUS
  Filled 2023-01-22: qty 1

## 2023-01-22 MED ORDER — FUROSEMIDE 10 MG/ML IJ SOLN
80.0000 mg | Freq: Two times a day (BID) | INTRAMUSCULAR | Status: DC
  Administered 2023-01-22 – 2023-01-29 (×14): 80 mg via INTRAVENOUS
  Filled 2023-01-22 (×14): qty 8

## 2023-01-22 MED ORDER — HYDRALAZINE HCL 20 MG/ML IJ SOLN
5.0000 mg | INTRAMUSCULAR | Status: DC | PRN
  Administered 2023-01-22: 5 mg via INTRAVENOUS
  Filled 2023-01-22: qty 1

## 2023-01-22 MED ORDER — AMLODIPINE BESYLATE 5 MG PO TABS
5.0000 mg | ORAL_TABLET | Freq: Every day | ORAL | Status: DC
  Administered 2023-01-22 – 2023-01-23 (×2): 5 mg via ORAL
  Filled 2023-01-22 (×2): qty 1

## 2023-01-22 MED ORDER — GUAIFENESIN 100 MG/5ML PO LIQD: 5.0000 mL | ORAL | Status: DC | PRN

## 2023-01-22 MED ORDER — DEXTROSE 50 % IV SOLN
INTRAVENOUS | Status: AC
  Filled 2023-01-22: qty 50

## 2023-01-22 MED ORDER — ADULT MULTIVITAMIN W/MINERALS CH
1.0000 | ORAL_TABLET | Freq: Every day | ORAL | Status: DC
  Administered 2023-01-22 – 2023-01-27 (×6): 1 via ORAL
  Filled 2023-01-22 (×6): qty 1

## 2023-01-22 MED ORDER — FUROSEMIDE 10 MG/ML IJ SOLN
80.0000 mg | Freq: Once | INTRAMUSCULAR | Status: AC
  Administered 2023-01-22: 80 mg via INTRAVENOUS
  Filled 2023-01-22: qty 8

## 2023-01-22 MED ORDER — IPRATROPIUM-ALBUTEROL 0.5-2.5 (3) MG/3ML IN SOLN: 3.0000 mL | RESPIRATORY_TRACT | Status: DC | PRN

## 2023-01-22 MED ORDER — INSULIN ASPART 100 UNIT/ML IJ SOLN
0.0000 [IU] | Freq: Three times a day (TID) | INTRAMUSCULAR | Status: DC
  Administered 2023-01-22: 2 [IU] via SUBCUTANEOUS
  Administered 2023-01-23: 5 [IU] via SUBCUTANEOUS
  Administered 2023-01-23: 2 [IU] via SUBCUTANEOUS
  Administered 2023-01-24 (×2): 3 [IU] via SUBCUTANEOUS
  Administered 2023-01-25: 5 [IU] via SUBCUTANEOUS
  Administered 2023-01-25 – 2023-01-26 (×3): 2 [IU] via SUBCUTANEOUS
  Administered 2023-01-26: 5 [IU] via SUBCUTANEOUS
  Administered 2023-01-26: 2 [IU] via SUBCUTANEOUS
  Administered 2023-01-27 (×2): 3 [IU] via SUBCUTANEOUS
  Administered 2023-01-28: 2 [IU] via SUBCUTANEOUS
  Administered 2023-01-28: 3 [IU] via SUBCUTANEOUS
  Administered 2023-01-29: 5 [IU] via SUBCUTANEOUS
  Administered 2023-01-29: 2 [IU] via SUBCUTANEOUS
  Administered 2023-01-30: 5 [IU] via SUBCUTANEOUS
  Administered 2023-01-30: 2 [IU] via SUBCUTANEOUS
  Administered 2023-01-30 – 2023-01-31 (×2): 3 [IU] via SUBCUTANEOUS
  Administered 2023-01-31 (×2): 2 [IU] via SUBCUTANEOUS
  Administered 2023-02-01 (×2): 3 [IU] via SUBCUTANEOUS
  Administered 2023-02-01: 5 [IU] via SUBCUTANEOUS
  Administered 2023-02-02 (×2): 3 [IU] via SUBCUTANEOUS
  Administered 2023-02-02: 2 [IU] via SUBCUTANEOUS
  Administered 2023-02-03 (×2): 3 [IU] via SUBCUTANEOUS

## 2023-01-22 MED ORDER — NITROGLYCERIN 2 % TD OINT
0.5000 [in_us] | TOPICAL_OINTMENT | Freq: Four times a day (QID) | TRANSDERMAL | Status: AC
  Administered 2023-01-22 – 2023-01-23 (×4): 0.5 [in_us] via TOPICAL
  Filled 2023-01-22 (×4): qty 1

## 2023-01-22 MED ORDER — METOPROLOL SUCCINATE ER 25 MG PO TB24
25.0000 mg | ORAL_TABLET | Freq: Every day | ORAL | Status: DC
  Administered 2023-01-22 – 2023-01-29 (×8): 25 mg via ORAL
  Filled 2023-01-22 (×9): qty 1

## 2023-01-22 MED ORDER — METOPROLOL TARTRATE 5 MG/5ML IV SOLN
5.0000 mg | INTRAVENOUS | Status: DC | PRN
  Administered 2023-01-25 – 2023-01-30 (×6): 5 mg via INTRAVENOUS
  Filled 2023-01-22 (×7): qty 5

## 2023-01-22 MED ORDER — ALBUTEROL SULFATE (2.5 MG/3ML) 0.083% IN NEBU: 2.5000 mg | INHALATION_SOLUTION | RESPIRATORY_TRACT | Status: DC | PRN

## 2023-01-22 NOTE — Progress Notes (Signed)
05:17 Patient's daughter stated that patient is getting confused, began telling her that he cannot breathe. RN assessed patient, attached SPO2 probe to patient, current SPO2 at this time was 96% on room air. Applied 2L of O2 for assurance and taught patient to breathe through his nose out with his mouth. Patient continues to have shallow/labored breathing.  Informed provider, Dr. Joneen Roach of this complaint. Order was placed to have bipap on, patient also has orders to be transferred at this time.   Report is given at 06:16.  Transferred patient at 06:40am.

## 2023-01-22 NOTE — Progress Notes (Cosign Needed Addendum)
ANTICOAGULATION CONSULT NOTE - Initial Consult  Pharmacy Consult for heparin Indication: atrial fibrillation (CVA 6/27)  No Known Allergies  Patient Measurements: Weight: 99.3 kg (218 lb 14.7 oz) Heparin Dosing Weight: 95.7 kg  Vital Signs: Temp: 97.9 F (36.6 C) (07/03 0646) Temp Source: Oral (07/03 0646) BP: 192/103 (07/03 0646) Pulse Rate: 73 (07/03 0646)  Labs: Recent Labs    01/22/23 0606 01/22/23 0608  HGB  --  9.2*  HCT  --  28.3*  PLT  --  209  CREATININE 6.07*  --   TROPONINIHS  --  35*    Estimated Creatinine Clearance: 12.8 mL/min (A) (by C-G formula based on SCr of 6.07 mg/dL (H)).   Medical History: Past Medical History:  Diagnosis Date   Diabetes mellitus    HLD (hyperlipidemia)    Hypertension     Medications:  Medications Prior to Admission  Medication Sig Dispense Refill Last Dose   metoprolol succinate (TOPROL-XL) 25 MG 24 hr tablet Take by mouth.      acetaminophen (TYLENOL) 325 MG tablet Take 2 tablets (650 mg total) by mouth every 6 (six) hours.      amLODipine (NORVASC) 5 MG tablet Take 5 mg by mouth daily.      aspirin 81 MG tablet Take 81 mg by mouth daily.        atorvastatin (LIPITOR) 40 MG tablet Take 40 mg by mouth daily.      benazepril (LOTENSIN) 40 MG tablet Take 40 mg by mouth daily.      glipiZIDE (GLUCOTROL) 10 MG tablet Take 10 mg by mouth daily before breakfast.      ibuprofen (ADVIL) 200 MG tablet Take 3 tablets (600 mg total) by mouth every 6 (six) hours.      levothyroxine (SYNTHROID) 100 MCG tablet Take 50 mcg by mouth daily before breakfast.      oxyCODONE (ROXICODONE) 5 MG immediate release tablet Take 1 tablet (5 mg total) by mouth every 6 (six) hours as needed for severe pain. 20 tablet 0    simvastatin (ZOCOR) 20 MG tablet Take 20 mg by mouth daily.      Scheduled:   furosemide  80 mg Intravenous Q12H   insulin aspart  0-15 Units Subcutaneous TID WC   insulin aspart  0-5 Units Subcutaneous QHS   Infusions:   PRN: acetaminophen, guaiFENesin, hydrALAZINE, ipratropium-albuterol, metoprolol tartrate, ondansetron (ZOFRAN) IV, senna-docusate, traZODone  Assessment:  Patient is a 73 year old male presenting with SOB. 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 Discharged stable from outside facility 7/1 on apixaban 2.5 BID (last dose 7/2 1900). Apixaban on hold, pharmacy consulted to start heparin for Afib. Currently in NSR. Last BP 192/103. Hgb 9.4, Plt 209. No symptoms of bleeding noted. With recent CVA, will have lower heparin level goal and will not bolus. Baseline heparin level elevated from recent apixaban, will monitor aPTT until correlating with heparin level.   Goal of Therapy:  Heparin level 0.3-0.5 units/ml aPTT 66-85 seconds Monitor platelets by anticoagulation protocol: Yes   Plan:  Start heparin infusion at 1250 units/hr Check aPTT in 8 hours Daily aPTT and heparin level Continue to monitor H&H and platelets Follow up restart and dose of apixaban  Jacklynn Barnacle, PharmD PGY1 Pharmacy Resident 01/22/2023,8:50 AM

## 2023-01-22 NOTE — Evaluation (Addendum)
Physical Therapy Evaluation Patient Details Name: [name] MRN: [id number] DOB: [birth year] Today's Date: 01/22/2023  History of Present Illness  72 y.o. male presents to [facility] hospital on 01/22/2023 with SOB and AMS, found to be in acute respiratory failure 2/2 pulmonary edema along with acute diastolic congestive heart failure. 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 PMH includes HTN, DMII, PAF, CKD IV.   Clinical Impression  Upon evaluation, patient received supine in bed with HOB elevated and family present bedside. Prior to admission to [facility], patient required assistance for functional mobility and ADLs; daughter reported patient still had residual weakness in RLE secondary to the stroke. Patient is supported by family who will be available to assist PRN following discharge. Today, patient demonstrated ability to perform bed mobility and transfers without physical assistance. Able to ambulate with RW without LOB; cued to improve step length on RLE. O2 Sat stable throughout session on RA. End of session PT provided handout and educated family on exercises in order to improve LE strength. Recommending discharge to home with post acute rehab services and family support.       Assistance Recommended at Discharge Set up Supervision/Assistance  If plan is discharge home, recommend the following:  Can travel by private vehicle  Help with stairs or ramp for entrance;Assist for transportation;A little help with walking and/or transfers        Equipment Recommendations None recommended by PT  Recommendations for Other Services  OT consult    Functional Status Assessment Patient has had a recent decline in their functional status and demonstrates the ability to make significant improvements in function in a reasonable and predictable amount of time.     Precautions / Restrictions Precautions Precautions: Fall Restrictions Weight  Bearing Restrictions: No      Mobility  Bed Mobility Overal bed mobility: Modified Independent             General bed mobility comments: Able to transition to EOB without physical assistance; required increased time.    Transfers Overall transfer level: Needs assistance Equipment used: Rolling walker (2 wheels) Transfers: Sit to/from Stand Sit to Stand: Supervision           General transfer comment: Close Supervision provided for safety; able to transfer without physical assistance. Verbal cues provided for hand placement for power up.    Ambulation/Gait Ambulation/Gait assistance: Min guard Gait Distance (Feet): 150 Feet Assistive device: Rolling walker (2 wheels) Gait Pattern/deviations: Step-to pattern, Step-through pattern, Decreased step length - right, Decreased weight shift to right, Decreased stride length Gait velocity: Decreased Gait velocity interpretation: 1.31 - 2.62 ft/sec, indicative of limited community ambulator   General Gait Details: Initially presented with step to pattern; improved with verbal cue to progress RLE past LLE. Presented with inconsistent stepping pattern and decreased heel strike on RLE.  Stairs            Wheelchair Mobility     Tilt Bed    Modified Rankin (Stroke Patients Only)       Balance Overall balance assessment: Needs assistance Sitting-balance support: Feet supported, Single extremity supported Sitting balance-Leahy Scale: Fair Sitting balance - Comments: Able to sit EOB with single UE support.   Standing balance support: Bilateral upper extremity supported, During functional activity, Reliant on assistive device for balance Standing balance-Leahy Scale: Fair Standing balance comment: Without UE during static balance; reliant on RW with dynamic activities  Pertinent Vitals/Pain Pain Assessment Pain Assessment: No/denies pain    Home Living Family/patient expects  to be discharged to:: Private residence Living Arrangements: Spouse/significant other;Children Available Help at Discharge: Family Type of Home: House Home Access: Stairs to enter Entrance Stairs-Rails: None Secretary/administrator of Steps: 3   Home Layout: One level Home Equipment: Agricultural consultant (2 wheels)      Prior Function Prior Level of Function : Independent/Modified Independent                     Hand Dominance   Dominant Hand: Right    Extremity/Trunk Assessment   Upper Extremity Assessment Upper Extremity Assessment: Overall WFL for tasks assessed    Lower Extremity Assessment Lower Extremity Assessment: RLE deficits/detail RLE Deficits / Details: Impairments with coordination and sequencing during gait. Inconsistent step pattern due to weakness. RLE Coordination: decreased gross motor    Cervical / Trunk Assessment Cervical / Trunk Assessment: Normal  Communication   Communication: Prefers language other than [place];Interpreter utilized (Daughter translated throughout session)  Cognition Arousal/Alertness: Awake/alert Behavior During Therapy: WFL for tasks assessed/performed Overall Cognitive Status: Within Functional Limits for tasks assessed                                 General Comments: Patient A&Ox4; able to follow one step commands consistently throughout session.        General Comments General comments (skin integrity, edema, etc.): Daughter present throughout session as Nurse, learning disability. Patient able to maintain O2 sat > 90% on RA; notified RN.    Exercises     Assessment/Plan    PT Assessment Patient needs continued PT services.   PT Problem List Decreased strength;Decreased range of motion;Decreased activity tolerance;Decreased balance;Decreased coordination;Decreased mobility;Decreased knowledge of use of DME       PT Treatment Interventions DME instruction;Stair training;Gait training;Functional mobility  training;Therapeutic activities;Therapeutic exercise;Neuromuscular re-education    PT Goals (Current goals can be found in the Care Plan section)  Acute Rehab PT Goals Patient Stated Goal: Patient would like to return home to family. PT Goal Formulation: With patient/family Time For Goal Achievement: 02/05/23 Potential to Achieve Goals: Good    Frequency Min 1X/week     Co-evaluation               AM-PAC PT "6 Clicks" Mobility  Outcome Measure Help needed turning from your back to your side while in a flat bed without using bedrails?: None Help needed moving from lying on your back to sitting on the side of a flat bed without using bedrails?: None Help needed moving to and from a bed to a chair (including a wheelchair)?: None Help needed standing up from a chair using your arms (e.g., wheelchair or bedside chair)?: None Help needed to walk in hospital room?: A Little Help needed climbing 3-5 steps with a railing? : A Little 6 Click Score: 22    End of Session Equipment Utilized During Treatment: Gait belt Activity Tolerance: Patient tolerated treatment well Patient left: in bed;with call bell/phone within reach;with nursing/sitter in room Nurse Communication: Mobility status PT Visit Diagnosis: Muscle weakness (generalized) (M62.81);Difficulty in walking, not elsewhere classified (R26.2)    Time: 1610-9604 PT Time Calculation (min) (ACUTE ONLY): 32 min   Charges:   PT Evaluation $PT Eval Low Complexity: 1 Low PT Treatments $Therapeutic Exercise: 8-22 mins PT General Charges $$ ACUTE PT VISIT: 1 Visit  Christene Lye, SPT Acute Rehabilitation Services [phone] Secure chat preferred    Christene Lye 01/22/2023, 6:16 PM

## 2023-01-22 NOTE — Consult Note (Addendum)
Reason for Consult:AKI/CKD stage IV Referring Physician: Nelson Chimes, MD  Terri Baelin Petrossian is an 72 y.o. 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  At [facility] CXR with pulmonary edema, BNP >25,000, Scr 4.63, CT of head was negative.  He was transferred to [facility] for further management.  We were consulted due to AKI/CKD stage IV.  The trend in Scr is seen below.    Of note, he did have AKI in 2012 and required temporary HD at that time.  He is followed at our office and his daughters who were at the bedside were aware that he was close to starting dialysis.    He denies any N/V/dysgeusia, anorexia, malaise.  He is breathing better today and is off of BiPAP.  Trend in Creatinine: Creatinine, Ser  Date/Time Value Ref Range Status  01/22/2023 06:06 AM 6.07 (H) 0.61 - 1.24 mg/dL Final  16/04/9603 54:09 PM 5.96 (H)    01/17/2023 4.63 (H)    12/05/2022 07:15 PM 3.79 (H) 0.61 - 1.24 mg/dL Final  81/19/1478 2.95 (H)    08/31/2019 12:00 PM 1.45 (H) 0.61 - 1.24 mg/dL Final  62/13/0865 78:46 AM 1.35 0.4 - 1.5 mg/dL Final  96/29/5284 13:24 AM 1.46 0.4 - 1.5 mg/dL Final  40/04/2724 36:64 AM 1.54 (H) 0.4 - 1.5 mg/dL Final  40/34/7425 95:63 AM 1.60 (H) 0.4 - 1.5 mg/dL Final  87/56/4332 95:18 AM 1.68 (H) 0.4 - 1.5 mg/dL Final  84/16/6063 01:60 AM 1.52 (H) 0.4 - 1.5 mg/dL Final  10/93/2355 73:22 AM 1.36 0.4 - 1.5 mg/dL Final  02/54/2706 23:76 AM 1.32 0.4 - 1.5 mg/dL Final  28/31/5176 16:07 AM 1.54 (H) 0.4 - 1.5 mg/dL Final  37/04/6268 48:54 AM 1.98 DELTA CHECK NOTED (H) 0.4 - 1.5 mg/dL Final  62/70/3500 93:81 AM 2.98 (H) 0.4 - 1.5 mg/dL Final  82/99/3716 96:78 AM 2.78 (H) 0.4 - 1.5 mg/dL Final  93/81/0175 10:25 PM 2.51 (H) 0.4 - 1.5 mg/dL Final  85/27/7824 23:53 PM 2.66 (H) 0.4 - 1.5 mg/dL Final  61/44/3154 00:86 AM 2.82 (H) 0.4 - 1.5 mg/dL Final  76/19/5093  26:71 AM 3.20 (H) 0.4 - 1.5 mg/dL Final  24/58/0998 33:82 AM 3.62 (H) 0.4 - 1.5 mg/dL Final  50/53/9767 34:19 AM 3.98 (H) 0.4 - 1.5 mg/dL Final  37/90/2409 73:53 AM 4.08 (H) 0.4 - 1.5 mg/dL Final  29/92/4268 34:19 AM 4.21 (H) 0.4 - 1.5 mg/dL Final  62/22/9798 92:11 AM 4.27 (H) 0.4 - 1.5 mg/dL Final  94/17/4081 44:81 AM 4.43 (H) 0.4 - 1.5 mg/dL Final  85/63/1497 02:63 AM 4.41 (H) 0.4 - 1.5 mg/dL Final  78/58/8502 77:41 AM 4.24 (H) 0.4 - 1.5 mg/dL Final  28/78/6767 20:94 AM 4.22 (H) 0.4 - 1.5 mg/dL Final  70/96/2836 62:94 AM 3.91 (H) 0.4 - 1.5 mg/dL Final  76/54/6503 54:65 AM 3.86 (H) 0.4 - 1.5 mg/dL Final  68/06/7516 00:17 AM 3.58 (H) 0.4 - 1.5 mg/dL Final  49/44/9675 91:63 AM 3.14 DELTA CHECK NOTED (H) 0.4 - 1.5 mg/dL Final  84/66/5993 57:01 AM 5.22 (H) 0.4 - 1.5 mg/dL Final  77/93/9030 09:23 AM 4.85 (H) 0.4 - 1.5 mg/dL Final  30/01/6225 33:35 AM 6.72 (H) 0.4 - 1.5 mg/dL Final  45/62/5638 93:73 AM 5.97 (H) 0.4 - 1.5 mg/dL Final  42/87/6811 57:26 AM 5.84 (H) 0.4 - 1.5 mg/dL Final  20/35/5974 16:38 AM  5.81 (H) 0.4 - 1.5 mg/dL Final  16/04/9603 54:09 PM 5.68 (H) 0.4 - 1.5 mg/dL Final  81/19/1478 29:56 PM 5.69 (H) 0.4 - 1.5 mg/dL Final  21/30/8657 84:69 AM 5.60 (H) 0.4 - 1.5 mg/dL Final  62/95/2841 32:44 AM 4.78 (H) 0.4 - 1.5 mg/dL Final  07/24/7251 66:44 AM 3.84 (H) 0.4 - 1.5 mg/dL Final  03/47/4259 56:38 PM 2.98 (H) 0.4 - 1.5 mg/dL Final  75/64/3329 51:88 AM 3.14 (H) 0.4 - 1.5 mg/dL Final  41/66/0630 16:01 PM 4.17 (H) 0.4 - 1.5 mg/dL Final  09/32/3557 32:20 AM 4.87 (H) 0.4 - 1.5 mg/dL Final  25/42/7062 37:62 AM 3.75 (H) 0.4 - 1.5 mg/dL Final  83/15/1761 60:73 PM 2.96 (H) 0.4 - 1.5 mg/dL Final  71/12/2692 85:46 AM 3.31 (H) 0.4 - 1.5 mg/dL Final  27/09/5007 38:18 PM 3.93 (H) 0.4 - 1.5 mg/dL Final  29/93/7169 67:89 AM 3.49 (H) 0.4 - 1.5 mg/dL Final    PMH:   Past Medical History:  Diagnosis Date   Diabetes mellitus    HLD (hyperlipidemia)    Hypertension     PSH:   Past  Surgical History:  Procedure Laterality Date   APPENDECTOMY     ruptured appendix 13 years ago did not close wound   HERNIA REPAIR     11 yrs ago    OPEN REDUCTION INTERNAL FIXATION (ORIF) DISTAL RADIAL FRACTURE Left 09/03/2019   Procedure: OPEN REDUCTION INTERNAL FIXATION (ORIF) DISTAL RADIAL FRACTURE;  Surgeon: Mack Hook, MD;  Location: [facility];  Service: Orthopedics;  Laterality: Left;    Allergies: No Known Allergies  Medications:   Prior to Admission medications   Medication Sig Start Date End Date Taking? Authorizing Provider  metoprolol succinate (TOPROL-XL) 25 MG 24 hr tablet Take by mouth. 01/20/23 02/19/23 Yes [provider]  acetaminophen (TYLENOL) 325 MG tablet Take 2 tablets (650 mg total) by mouth every 6 (six) hours. 09/03/19   Mack Hook, MD  amLODipine (NORVASC) 5 MG tablet Take 5 mg by mouth daily.    [provider]  aspirin 81 MG tablet Take 81 mg by mouth daily.      [provider]  atorvastatin (LIPITOR) 40 MG tablet Take 40 mg by mouth daily.    [provider]  benazepril (LOTENSIN) 40 MG tablet Take 40 mg by mouth daily.    [provider]  glipiZIDE (GLUCOTROL) 10 MG tablet Take 10 mg by mouth daily before breakfast.    [provider]  ibuprofen (ADVIL) 200 MG tablet Take 3 tablets (600 mg total) by mouth every 6 (six) hours. 09/03/19   Mack Hook, MD  levothyroxine (SYNTHROID) 100 MCG tablet Take 50 mcg by mouth daily before breakfast.    [provider]  oxyCODONE (ROXICODONE) 5 MG immediate release tablet Take 1 tablet (5 mg total) by mouth every 6 (six) hours as needed for severe pain. 09/03/19   Mack Hook, MD  simvastatin (ZOCOR) 20 MG tablet Take 20 mg by mouth daily.    [provider]    Inpatient medications:  amLODipine  5 mg Oral Daily   furosemide  80 mg Intravenous Q12H   insulin aspart  0-15 Units Subcutaneous TID WC   insulin aspart  0-5  Units Subcutaneous QHS   metoprolol succinate  25 mg Oral Daily   nitroGLYCERIN  0.5 inch Topical Q6H    Discontinued Meds:   Medications Discontinued During This Encounter  Medication Reason   albuterol (PROVENTIL) (2.5  MG/3ML) 0.083% nebulizer solution 2.5 mg    hydrALAZINE (APRESOLINE) injection 5 mg    metoprolol tartrate (LOPRESSOR) injection 5 mg     Social History:  reports that he has never smoked. He has never used smokeless tobacco. He reports that he does not drink alcohol and does not use drugs.  Family History:  No family history on file.  Pertinent items are noted in HPI. Weight change:  No intake or output data in the 24 hours ending 01/22/23 1245 BP (!) 192/100 (BP Location: Left Arm)   Pulse 73   Temp 97.9 F (36.6 C) (Oral)   Resp 19   Wt 99.3 kg   SpO2 100%   BMI 30.53 kg/m  Vitals:   01/22/23 0858 01/22/23 1006 01/22/23 1116 01/22/23 1209  BP:  (!) 172/108  (!) 192/100  Pulse: 71 75 67 73  Resp: (!) 29 20 17 19   Temp:  97.9 F (36.6 C)  97.9 F (36.6 C)  TempSrc:  Oral  Oral  SpO2:  100%  100%  Weight:         General appearance: cooperative, fatigued, and no distress Head: Normocephalic, without obvious abnormality, atraumatic Eyes: negative findings: lids and lashes normal, conjunctivae and sclerae normal, and corneas clear Resp: clear to auscultation bilaterally Cardio: regular rate and rhythm, S1, S2 normal, no murmur, click, rub or gallop GI: soft, non-tender; bowel sounds normal; no masses,  no organomegaly Extremities: extremities normal, atraumatic, no cyanosis or edema  Labs: Basic Metabolic Panel: Recent Labs  Lab 01/22/23 0606  NA 140  K 4.1  CL 116*  CO2 12*  GLUCOSE 45*  BUN 85*  CREATININE 6.07*  ALBUMIN 2.7*  CALCIUM 7.3*   Liver Function Tests: Recent Labs  Lab 01/22/23 0606  AST 20  ALT 21  ALKPHOS 74  BILITOT 0.7  PROT 6.1*  ALBUMIN 2.7*   No results for input(s): "LIPASE", "AMYLASE" in the last 168  hours. No results for input(s): "AMMONIA" in the last 168 hours. CBC: Recent Labs  Lab 01/22/23 0608  WBC 9.1  NEUTROABS 7.0  HGB 9.2*  HCT 28.3*  MCV 93.1  PLT 209   PT/INR: @LABRCNTIP (inr:5) Cardiac Enzymes: )No results for input(s): "CKTOTAL", "CKMB", "CKMBINDEX", "TROPONINI" in the last 168 hours. CBG: Recent Labs  Lab 01/22/23 0701 01/22/23 0730 01/22/23 1206 01/22/23 1241  GLUCAP 35* 125* 53* 99    Iron Studies: No results for input(s): "IRON", "TIBC", "TRANSFERRIN", "FERRITIN" in the last 168 hours.  Xrays/Other Studies: US RENAL  Result Date: 01/22/2023 CLINICAL DATA:  Acute kidney injury EXAM: RENAL / URINARY TRACT ULTRASOUND COMPLETE COMPARISON:  CT abdomen pelvis 12/06/2022 FINDINGS: Right Kidney: Renal measurements: 11.4 x 4.9 x 4.7 cm = volume: 136 mL. Mildly thinned, echogenic renal cortex. No hydronephrosis. Left Kidney: Renal measurements: 10.8 x 6.1 x 5.5 cm = volume: 190 mL. Mildly thinned, echogenic renal cortex. No hydronephrosis. Bladder: Appears normal for degree of bladder distention. Other: None. IMPRESSION: Thinned echogenic renal cortices consistent with chronic medical renal disease. Electronically Signed   By: Acquanetta Belling M.D.   On: 01/22/2023 10:27   DG CHEST PORT 1 VIEW  Result Date: 01/22/2023 CLINICAL DATA:  Shortness of breath EXAM: PORTABLE CHEST 1 VIEW COMPARISON:  10/24/2010 and overlapping portions CT abdomen 12/06/2022 FINDINGS: Upper normal heart size. Basilar and infrahilar patchy densities suspicious for acute pulmonary edema given the elevated BNP. Bibasilar pneumonia is a less likely differential diagnostic consideration. Mildly prominent right hilum, probably vascular, less likely  from adenopathy. 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 Lower thoracic spondylosis. Costophrenic angles partially excluded although no obvious blunting of the visualized portion observed. IMPRESSION: 1. Patchy basilar and infrahilar densities  suspicious for acute pulmonary edema given the elevated BNP. Bibasilar pneumonia is a less likely differential diagnostic consideration. 2. Mildly prominent right hilum, probably vascular, less likely from adenopathy. 3. 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 Electronically Signed   By: Gaylyn Rong M.D.   On: 01/22/2023 09:33     Assessment/Plan:  AKI/CKD stage IV - presumably due to decompensated CHF vs progressive CKD to stage V.  Will continue with IV lasix and follow his renal response to diuresis.  No urgent indication for dialysis at this time.  I did speak with his daughters, and discussed that if he does not respond, he will require initiation of HD which will likely be permanent.   Avoid nephrotoxic medications including NSAIDs and iodinated intravenous contrast exposure unless the latter is absolutely indicated.  Preferred narcotic agents for pain control are hydromorphone, fentanyl, and methadone. Morphine should not be used. Avoid Baclofen and avoid oral sodium phosphate and magnesium citrate based laxatives / bowel preps. Continue strict Input and Output monitoring. Will monitor the patient closely with you and intervene or adjust therapy as indicated by changes in clinical status/labs  Acute hypoxic respiratory failure - due to pulmonary edema.  ECHO pending.  Off of BiPAP.  Continue with diuresis and follow.  Bronchodilators per primary svc. Anemia of CKD stage IV - will check iron stores.  Hold off on ESA for now given acute CHF Acute on chronic CHF - diuresing.  ECHO pending. AGMA - due to #1.  Hold off on sodium bicarbonate until volume improves.  CKD-BMD - will check phos and iPTH Vascular access - he will need AVF/AVG this admission once he is more stable given his advanced CKD at baseline.     Julien Nordmann Mozella Rexrode 01/22/2023, 12:45 PM

## 2023-01-22 NOTE — Progress Notes (Signed)
Initial Nutrition Assessment  DOCUMENTATION CODES:   Not applicable  INTERVENTION:  Liberalize diet to a 2 gram sodium diet Snacks TID between meals to maintain consistency with home regimen Ensure Enlive po BID, each supplement provides 350 kcal and 20 grams of protein. Renal MVI with minerals daily  NUTRITION DIAGNOSIS:   Inadequate oral intake related to acute illness as evidenced by per patient/family report.  GOAL:   Patient will meet greater than or equal to 90% of their needs  MONITOR:   PO intake, Supplement acceptance, Labs, Weight trends, I & O's  REASON FOR ASSESSMENT:   Malnutrition Screening Tool    ASSESSMENT:   Pt admitted with SOB and altered mentation. PMH significant for HTN, T2DM, PAF on Eliquis, CKD stage IV, recent CVA.  Pt receiving medical management of acute respiratory failure secondary to pulmonary edema and acute diastolic CHF. Nephrology following for possible HD initiation. No urgent indication for HD at this time, continue with diuresis.   Spoke with pt utilizing daughter as interpreter. DM coordinator present upon entering room discussing diabetes medications.   Pt reports that since his most recent admission (6/27), he has been feeling hungry but starts to eat and loses his appetite. Portion sizes have decreased significantly since that time. He feels that this has led to a 6 lb weight loss.   Up until last admission his last admission, he was eating well. His typical intake includes multiple small meals/snacks throughout the day. His daughter reports that he does not like vegetables but will eat a small variety such as carrots, zucchini and green beans. He loves meats, fruit and jello.   Pt wears dentures which he did not have at time of visit but his wife plans to bring to the hospital. No additional concerns with chewing/swallowing at this time.   Meal completions: 7/3: 50% lunch  Unfortunately, there is limited documentation of weight  history on file to review within the last year. Weight on 5/16 documented to be 98.4 kg. Current weight 99.3 kg. Will continue to monitor trends throughout admission.   Pt at elevated nutrition risk in setting of reduced PO intake ~6 days and mild muscle depletions. Will continue to monitor and assess nutrition status throughout admission.   Edema: mild pitting BLE  Medications: lasix 80mg  q12h, SSI 0-15 units TID, SSI 0-5 units at bedtime  Labs: BUN 85, Cr 6.07, corrected Ca 8.34, GFR 9, CBG's 35-125 x3 hours, HgbA1c 5.6%  NUTRITION - FOCUSED PHYSICAL EXAM:  Flowsheet Row Most Recent Value  Orbital Region No depletion  Thoracic and Lumbar Region No depletion  Buccal Region No depletion  Temple Region No depletion  Clavicle Bone Region Mild depletion  Clavicle and Acromion Bone Region Mild depletion  Scapular Bone Region No depletion  Dorsal Hand No depletion  Patellar Region Severe depletion  Anterior Thigh Region Severe depletion  Posterior Calf Region Mild depletion  Edema (RD Assessment) Mild  [RUE]  Hair Reviewed  Eyes Reviewed  Mouth Other (Comment)  [edentulous]  Skin Reviewed  Nails Reviewed       Diet Order:   Diet Order             Diet 2 gram sodium Room service appropriate? Yes; Fluid consistency: Thin; Fluid restriction: 1200 mL Fluid  Diet effective now                   EDUCATION NEEDS:   Education needs have been addressed  Skin:  Skin Assessment: Reviewed RN  Assessment  Last BM:  7/2  Height:   Ht Readings from Last 1 Encounters:  12/05/22 5\' 11"  (1.803 m)    Weight:   Wt Readings from Last 1 Encounters:  01/22/23 99.3 kg   BMI:  Body mass index is 30.53 kg/m.  Estimated Nutritional Needs:   Kcal:  2100-2300  Protein:  105-120g  Fluid:  >/=2L  Drusilla Kanner, RDN, LDN Clinical Nutrition

## 2023-01-22 NOTE — Progress Notes (Signed)
PROGRESS NOTE    [name]  ZOX:[id number] DOB: 1951-06-07 DOA: 01/22/2023 PCP: System, Provider Not In   Brief Narrative:  72 year old with history of past medical history of HTN, DM2, paroxysmal A-fib on Eliquis, CKD stage IV, recent CVA admitted to [facility] on Friday and diagnosed with CVA with residual left upper and lower extremity weakness.  Eventually patient went home and then suddenly developed shortness of breath with change in mental status at home.  Patient was found to have elevated BNP, creatinine of 5.96.  CT of the head was negative therefore transferred for further management.  Nephrology team was also consulted.   Assessment & Plan:  Active Problems:   Atrial fibrillation (HCC)   T2DM (type 2 diabetes mellitus) (HCC)   Hypertension   H/O: CVA (cerebrovascular accident)   Acute metabolic encephalopathy   Acute renal failure superimposed on stage 4 chronic kidney disease (HCC)   Acute pulmonary edema (HCC)      Acute respiratory failure secondary to pulmonary edema Acute diastolic congestive heart failure.  Stage IV. Ef 55% - Will attempt to wean off BiPAP.  Nitropaste -Lasix 80 mg IV twice daily. - Chest x-ray suggestive of bilateral pulmonary edema. -Will order stat EKG.  Trend troponins -Bronchodilators, I-S/flutter valve -Echocardiogram (OSH on January 17 2023) = Ef 55%, G1DD. Mod Pulm HTN   Acute on chronic kidney disease, stage IV Metabolic acidosis -Suspect precipitated by volume overload.  Baseline creatinine 2.5, admission creatinine 6.07 - Nephrology consulted.   Urine assess and renal ultrasound negative, no retention noted on bladder scan.  Place Foley if necessary -Diuretics with caution   Hypertensive urgency - Getting ago rest of diuretics.  If he is able to stay off BiPAP, will order p.o. regimen.  Nitropaste as needed.  IV as needed   History of paroxysmal atrial fibrillation -Awaiting pharmacy med rec.  While patient is on  BiPAP and has poor oral intake, will start patient on Heparin Drip   T2DM P.o. meds on hold.  Sliding scale and Accu-Cheks   Hypothyroidism -P.o. medication on hold.   Recent CVA/ left sided weakness -P.o. medications on hold  Awaiting pharmacy to complete preadmission med rec.   DVT prophylaxis: Heparin Drip Code Status: Full code, confirmed by family Family Communication: Daughter at bedside Status is: Inpatient Continue hospital stay         Subjective: Seen at bedside, appears comfortable on BiPAP.  Tells me he feels much better compared to yesterday after overnight diuresis and using BiPAP.   Examination:  General exam: Appears calm and comfortable  Respiratory system: Clear to auscultation. Respiratory effort normal. Cardiovascular system: S1 & S2 heard, RRR. No JVD, murmurs, rubs, gallops or clicks. No pedal edema. Gastrointestinal system: Abdomen is nondistended, soft and nontender. No organomegaly or masses felt. Normal bowel sounds heard. Central nervous system: Alert and oriented. No focal neurological deficits. Extremities: Symmetric 5 x 5 power. Skin: No rashes, lesions or ulcers Psychiatry: Judgement and insight appear normal. Mood & affect appropriate.  Objective: Vitals:   01/22/23 0541 01/22/23 0550 01/22/23 0621 01/22/23 0646  BP: (!) 206/106  (!) 201/104 (!) 192/103  Pulse: 91 85 83 73  Resp: (!) 25 (!) 29  (!) 24  Temp:    97.9 F (36.6 C)  TempSrc:    Oral  SpO2: 98% 99% 100% 100%  Weight:    99.3 kg   No intake or output data in the 24 hours ending 01/22/23 8119 [employer]  01/22/23 0522 01/22/23 0646  Weight: 98.7 kg 99.3 kg    Scheduled Meds:  insulin aspart  0-15 Units Subcutaneous TID WC   insulin aspart  0-5 Units Subcutaneous QHS   Continuous Infusions:  Nutritional status     Body mass index is 30.53 kg/m.  Data Reviewed:   CBC: Recent Labs  Lab 01/22/23 0608  WBC 9.1  NEUTROABS 7.0  HGB 9.2*  HCT 28.3*   MCV 93.1  PLT 209   Basic Metabolic Panel: Recent Labs  Lab 01/22/23 0606  NA 140  K 4.1  CL 116*  CO2 12*  GLUCOSE 45*  BUN 85*  CREATININE 6.07*  CALCIUM 7.3*  MG 1.8   GFR: Estimated Creatinine Clearance: 12.8 mL/min (A) (by C-G formula based on SCr of 6.07 mg/dL (H)). Liver Function Tests: Recent Labs  Lab 01/22/23 0606  AST 20  ALT 21  ALKPHOS 74  BILITOT 0.7  PROT 6.1*  ALBUMIN 2.7*   No results for input(s): "LIPASE", "AMYLASE" in the last 168 hours. No results for input(s): "AMMONIA" in the last 168 hours. Coagulation Profile: No results for input(s): "INR", "PROTIME" in the last 168 hours. Cardiac Enzymes: No results for input(s): "CKTOTAL", "CKMB", "CKMBINDEX", "TROPONINI" in the last 168 hours. BNP (last 3 results) No results for input(s): "PROBNP" in the last 8760 hours. HbA1C: Recent Labs    01/22/23 0606  HGBA1C 5.6   CBG: Recent Labs  Lab 01/22/23 0701 01/22/23 0730  GLUCAP 35* 125*   Lipid Profile: No results for input(s): "CHOL", "HDL", "LDLCALC", "TRIG", "CHOLHDL", "LDLDIRECT" in the last 72 hours. Thyroid Function Tests: No results for input(s): "TSH", "T4TOTAL", "FREET4", "T3FREE", "THYROIDAB" in the last 72 hours. Anemia Panel: No results for input(s): "VITAMINB12", "FOLATE", "FERRITIN", "TIBC", "IRON", "RETICCTPCT" in the last 72 hours. Sepsis Labs: Recent Labs  Lab 01/22/23 0606  LATICACIDVEN 0.7    No results found for this or any previous visit (from the past 240 hour(s)).       Radiology Studies: No results found.         LOS: 0 days   Time spent= 35 mins    [name] Michael Maxcy, MD [facility]  If 7PM-7AM, please contact night-coverage  01/22/2023, 8:11 AM

## 2023-01-22 NOTE — Plan of Care (Signed)
  Problem: Clinical Measurements: Goal: Respiratory complications will improve Outcome: Progressing   Problem: Coping: Goal: Level of anxiety will decrease Outcome: Progressing   Problem: Pain Managment: Goal: General experience of comfort will improve Outcome: Progressing   Problem: Skin Integrity: Goal: Risk for impaired skin integrity will decrease Outcome: Progressing   Problem: Metabolic: Goal: Ability to maintain appropriate glucose levels will improve Outcome: Progressing   Problem: Education: Goal: Knowledge of General Education information will improve Description: Including pain rating scale, medication(s)/side effects and non-pharmacologic comfort measures Outcome: Not Progressing   Problem: Health Behavior/Discharge Planning: Goal: Ability to manage health-related needs will improve Outcome: Not Progressing

## 2023-01-22 NOTE — Progress Notes (Signed)
Patient for transfer to progressive unit as he needs continuous Bipap.

## 2023-01-22 NOTE — Progress Notes (Signed)
ANTICOAGULATION CONSULT NOTE  Pharmacy Consult for heparin Indication: atrial fibrillation (CVA 6/27)  No Known Allergies  Patient Measurements: Weight: 99.3 kg (218 lb 14.7 oz) Heparin Dosing Weight: 95.7 kg  Vital Signs: Temp: 97.4 F (36.3 C) (07/03 1922) Temp Source: Oral (07/03 1922) BP: 178/89 (07/03 1922) Pulse Rate: 78 (07/03 1922)  Labs: Recent Labs    01/22/23 0606 01/22/23 0608 01/22/23 0820 01/22/23 1012 01/22/23 2030  HGB  --  9.2*  --   --   --   HCT  --  28.3*  --   --   --   PLT  --  209  --   --   --   APTT  --   --   --  38* 47*  LABPROT  --   --   --  16.8*  --   INR  --   --   --  1.3*  --   HEPARINUNFRC  --   --   --  >1.10*  --   CREATININE 6.07*  --   --   --   --   TROPONINIHS  --  35* 40*  --   --      Estimated Creatinine Clearance: 12.8 mL/min (A) (by C-G formula based on SCr of 6.07 mg/dL (H)).   Assessment: Patient is a 72 year old male presenting with SOB. 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 Likely a new start HD patient.  Apixaban on hold and transitioned to heparin   Initial PTT 47 seconds  Goal of Therapy:  Heparin level 0.3-0.5 units/ml aPTT 66-85 seconds Monitor platelets by anticoagulation protocol: Yes   Plan:  Increase heparin to 1450 units / hr Next PTT  with AM labs  Thank you Okey Regal, PharmD 01/22/2023,9:19 PM

## 2023-01-22 NOTE — TOC Initial Note (Signed)
Transition of Care [facility]) - Initial/Assessment Note    Patient Details  Name: [name] MRN: [id number] Date of Birth: [birth year]  Transition of Care [facility]) CM/SW Contact:    Michael Haven, RN Phone Number: 01/22/2023, 3:23 PM  Clinical Narrative:                 NCM spoke with patient daughter at the bedside with him, who speaks [place], patient speaks [place].  She states he lives at home in a house with wife and adult children.  He has a PCP , [name] at [facility] in [place] [phone].  He currently has no HH services in place at this time.  He does have a walker at home. His daughter, [name] or [name] will transport him home at [employer].  His family is his support system.  He gets his medications from the [facility] in [place].  He is on eliquis pta , he gets this thru patient assistance in the mail for free.  Will await pt eval.  Family states they will prefer for physical therapy to give them instructions to do at home than to pay for HHPT.   Also they will need medication ast at discharge , with Match [employer].    Expected Discharge Plan: Home/Self Care Barriers to Discharge: Continued Medical Work up   Patient Goals and CMS Choice Patient states their goals for this hospitalization and ongoing recovery are:: return home with wife   Choice offered to / list presented to : NA      Expected Discharge Plan and Services In-house Referral: NA Discharge Planning Services: CM Consult Post Acute Care Choice: NA Living arrangements for the past 2 months: Single Family Home                 DME Arranged: N/A DME Agency: NA       HH Arranged: NA          Prior Living Arrangements/Services Living arrangements for the past 2 months: Single Family Home Lives with:: Spouse, Adult Children Patient language and need for interpreter reviewed:: Yes Do you feel safe going back to the place where you live?: Yes      Need for Family  Participation in Patient Care: Yes (Comment) Care giver support system in place?: Yes (comment) Current home services: DME (walker) Criminal Activity/Legal Involvement Pertinent to Current Situation/Hospitalization: No - Comment as needed  Activities of Daily Living Home Assistive Devices/Equipment: None ADL Screening (condition at time of admission) Patient's cognitive ability adequate to safely complete daily activities?: Yes Is the patient deaf or have difficulty hearing?: No Does the patient have difficulty seeing, even when wearing glasses/contacts?: No Does the patient have difficulty concentrating, remembering, or making decisions?: No Patient able to express need for assistance with ADLs?: Yes Does the patient have difficulty dressing or bathing?: Yes Independently performs ADLs?: No Communication: Needs assistance Is this a change from baseline?: Pre-admission baseline Dressing (OT): Needs assistance Is this a change from baseline?: Pre-admission baseline Grooming: Needs assistance Is this a change from baseline?: Pre-admission baseline Feeding: Needs assistance Is this a change from baseline?: Pre-admission baseline Bathing: Needs assistance Is this a change from baseline?: Pre-admission baseline Toileting: Needs assistance Is this a change from baseline?: Pre-admission baseline In/Out Bed: Needs assistance Is this a change from baseline?: Pre-admission baseline Walks in Home: Needs assistance Is this a change from baseline?: Pre-admission baseline Does the patient have difficulty walking or climbing stairs?: Yes Weakness  of Legs: None Weakness of Arms/Hands: None  Permission Sought/Granted                  Emotional Assessment Appearance:: Appears stated age     Orientation: : Oriented to Self, Oriented to Place, Oriented to  Time, Oriented to Situation Alcohol / Substance Use: Not Applicable Psych Involvement: No (comment)  Admission diagnosis:  FLUID  OVERLOAD, ACUTE KIDNEY INJURY Patient Active Problem List   Diagnosis Date Noted   H/O: CVA (cerebrovascular accident) 01/22/2023   Acute metabolic encephalopathy 01/22/2023   DOE (dyspnea on exertion) 01/22/2023   Acute renal failure superimposed on stage 4 chronic kidney disease (HCC) 01/22/2023   Acute pulmonary edema (HCC) 01/22/2023   Atrial fibrillation (HCC) 10/21/2010   T2DM (type 2 diabetes mellitus) (HCC) 10/21/2010   Hypertension 10/21/2010   Chronic kidney disease (CKD), stage IV (severe) (HCC) 10/21/2010   ABDOMINAL WALL HERNIA 06/20/2010   PCP:  System, Provider Not In Pharmacy:   [facility] [address],  - [address] [place] [address] Phone: [phone] Fax: [phone]     Social Determinants of Health (SDOH) Social History: SDOH Screenings   Food Insecurity: No Food Insecurity (01/22/2023)  Housing: Low Risk  (01/22/2023)  Transportation Needs: No Transportation Needs (01/22/2023)  Utilities: Not At Risk (01/22/2023)  Tobacco Use: Low Risk  (08/04/2020)   SDOH Interventions:     Readmission Risk Interventions     No data to display

## 2023-01-22 NOTE — Inpatient Diabetes Management (Addendum)
Inpatient Diabetes Program Recommendations  AACE/ADA: New Consensus Statement on Inpatient Glycemic Control   Target Ranges:  Prepandial:   less than 140 mg/dL      Peak postprandial:   less than 180 mg/dL (1-2 hours)      Critically ill patients:  140 - 180 mg/dL    Latest Reference Range & Units 01/22/23 07:01 01/22/23 07:30  Glucose-Capillary 70 - 99 mg/dL 35 (LL) 098 (H)    Latest Reference Range & Units 01/22/23 06:06  Glucose 70 - 99 mg/dL 45 (L)    Latest Reference Range & Units 01/22/23 06:06  Hemoglobin A1C 4.8 - 5.6 % 5.6   Review of Glycemic Control  Diabetes history: DM2 Outpatient Diabetes medications: Glipizide 10 mg daily Current orders for Inpatient glycemic control: Novolog 0-15 units TID with meals, Novolog 0-5 units QHS  Inpatient Diabetes Program Recommendations:    Insulin: May want to consider decreasing Novolog to 0-9 units TID with meals.  Outpatient DM: Given patient presented with hypoglycemia and A1C 5.6% on 01/22/23 (indicating an average glucose of 114 mg/dl), would recommend to decrease dose or discontinue Glipizide as an outpatient. If patient is prescribed DM medication at discharge will need medication with low risk of hypoglycemia.  Addendum 01/22/23@11 :45-Spoke with patient and patient's daughter at bedside. Patient's daughter translating for patient. Patient's daughter states that he does not have any insurance and that he goes to [place] [facility] [phone]) and usually goes at least once a month. When asked about DM medications he is taking at home, he can not remember the name of DM medication. Asked specifically if he was taking Glipizide and he stated he was not sure. Patient's daughter states that he has been on Metformin and his doctor told him to stop Metformin due to kidney function but patient did not understand he was suppose to stop taking it so he was taking it up until a month or so ago. Patient states that he  checks glucose in the morning and sometimes after meals. Patient reports that the lowest glucose he has seen on his glucometer is 80 mg/dl. Discussed glucose goals. Discussed how Glipizide works and increased risk of hypoglycemia. Patient reports that he does not have any issues with hypoglycemia that he is aware of. Inquired about how he felt this morning with glucose of 45 and 35 mg/dl. Patient states he "just didn't feel good, not seeing well but not sure what was wrong".  Discussed hypoglycemia, symptoms, and treatment. Patient's daughter states that patient has been having more frequent episodes of confusion at home but they did not know to check glucose.   Discussed acute dangers of hypoglycemia. Encouraged patient to check glucose when he feels different or has symptoms of hypoglycemia; also encouraged family to check glucose when patient seems confused. Discussed A1C of 5.6% on 01/22/23 indicating an average glucose of 114 mg/dl. Explained that it would be recommended to decrease dose or discontinue Glipizide at time of discharge but up to the attending providers as to what would be done. Explained that he may need a different DM medication for DM if glucose is elevated and the medication needs to have low or no risk of hypoglycemia. Patient has all needed supplies for glucose monitoring at home. Patient does not usually take glucometer to appointments. Asked patient to be sure to take his glucometer with him to appointments so provider can review the glucose values. Encouraged patient to follow up with clinic in [place] and that  TOC would be consulted in case he needs assistance with medications at discharge. Patient and daughter verbalized understanding and have no questions at this time. [facility] in [place]. Informed that patient is prescribed Glipizide 10 mg daily and has been on the medication for several years; they reported that Metformin 1000 mg BID was stopped by  provider on April 22,2024.  Thanks, Orlando Penner, RN, MSN, CDCES Diabetes Coordinator Inpatient Diabetes Program [phone] (Team Pager from 8am to 5pm)

## 2023-01-22 NOTE — Progress Notes (Signed)
Patient is not ready to go on bipap at this time.  

## 2023-01-22 NOTE — H&P (Signed)
PCP:   System, Provider Not In   Chief Complaint:  Shortness of breath  HPI: This is a 72 year old male with past medical history of HTN, T2DM, PAF on Eliquis, CKD stage IV and recent CVA.  He was admitted on Friday at [facility] diagnosis of CVA with residual left-sided upper and lower extremity weakness on a walker.  Patient had difficulty walking, plans to follow-up PT/OT.  Today he had sudden onset shortness of breath and altered mentation.  At [facility] his CXR showed CHF, BNP>25,000, creatinine 5.96, potassium 4.7, CO2 16.  CT head negative. Prior to discharge 6/28 from [facility], patient's BNP ~23,000, creatinine 4.63.  Patient was fluid overloaded.  He was treated with IV Lasix and stabilized. Transfer requested for fluid overload.  During my interview patient denied being short of breath.  However, he was patient is dyspneic when he moves and orthopneic.    Stat Lasix 80 mg IV x 1 ordered. Nephrology consulted, will see patient in AM.  Oxygen ordered to keep sats greater than 88%.  Patient primary Spanish-speaking.  Daughter at bedside fluent in [place].  Translation  Review of Systems:  Per HPI  Past Medical History: Past Medical History:  Diagnosis Date   Diabetes mellitus    HLD (hyperlipidemia)    Hypertension    Past Surgical History:  Procedure Laterality Date   APPENDECTOMY     ruptured appendix 13 years ago did not close wound   HERNIA REPAIR     11 yrs ago    OPEN REDUCTION INTERNAL FIXATION (ORIF) DISTAL RADIAL FRACTURE Left 09/03/2019   Procedure: OPEN REDUCTION INTERNAL FIXATION (ORIF) DISTAL RADIAL FRACTURE;  Surgeon: Mack Hook, MD;  Location: [facility];  Service: Orthopedics;  Laterality: Left;    Medications: Prior to Admission medications   Medication Sig Start Date End Date Taking? Authorizing Provider  acetaminophen (TYLENOL) 325 MG tablet Take 2 tablets (650 mg total) by mouth every 6 (six) hours. 09/03/19   Mack Hook, MD  amLODipine  (NORVASC) 5 MG tablet Take 5 mg by mouth daily.    [provider]  aspirin 81 MG tablet Take 81 mg by mouth daily.      [provider]  atorvastatin (LIPITOR) 40 MG tablet Take 40 mg by mouth daily.    [provider]  benazepril (LOTENSIN) 40 MG tablet Take 40 mg by mouth daily.    [provider]  glipiZIDE (GLUCOTROL) 10 MG tablet Take 10 mg by mouth daily before breakfast.    [provider]  ibuprofen (ADVIL) 200 MG tablet Take 3 tablets (600 mg total) by mouth every 6 (six) hours. 09/03/19   Mack Hook, MD  levothyroxine (SYNTHROID) 100 MCG tablet Take 50 mcg by mouth daily before breakfast.    [provider]  oxyCODONE (ROXICODONE) 5 MG immediate release tablet Take 1 tablet (5 mg total) by mouth every 6 (six) hours as needed for severe pain. 09/03/19   Mack Hook, MD  simvastatin (ZOCOR) 20 MG tablet Take 20 mg by mouth daily.    [provider]    Allergies:  No Known Allergies  Social History:  reports that he has never smoked. He has never used smokeless tobacco. He reports that he does not drink alcohol and does not use drugs.  Family History: HTN  Physical Exam: Vitals:   01/22/23 0348  BP: (!) (P) 198/98  Pulse: (P) 76  Resp: (P) 20  Temp: (P) 97.9 F (36.6 C)  TempSrc: (  P) Oral  SpO2: (P) 98%    General:  Alert and oriented times three, well developed and nourished, in respiratory distress Eyes: Pink conjunctiva, no scleral icterus ENT: Moist oral mucosa, neck supple, no thyromegaly Lungs: positive use of accessory muscles, No wheeze or crackles appreciated, Cardiovascular: Tachycardia, positive 2+ JVD, RRR, no murmurs. No carotid bruits, no JVD Abdomen: soft, positive BS, NTND, no organomegaly, not an acute abdomen GU: not examined Neuro: CN II - XII grossly intact Musculoskeletal: No edema appreciated, strength 5/5 all extremities Skin: Scabbing rash left upper extremity and lower  extremity secondary to fall prior to CVA, no subcutaneous crepitation, no decubitus Psych: appropriate patient  Labs on Admission:   Ordered and pending  Radiological Exams on Admission: No results found.  Assessment/Plan Present on Admission:  Acute pulmonary edema //acute respiratory distress -Stat 80 mg IV Lasix given -BiPAP ordered.  Respiratory consult placed -Oxygen ordered -Nephrologist on-call consulted and aware -Strict I's and O's, daily weights -Will reassess patient -Stat chest x-ray, BNP ordered -Nebulizers as needed -Transferring to progressive -2D echo ordered, troponins and EKG ordered   Acute on chronic kidney disease (CKD), stage IV (severe)  -IV Lasix 80 mg x 1 ordered stat -Likely precipitated by fluid overload and uncontrolled hypertension.  BP medications ordered  Uncontrolled hypertension -Current BP 196/98 -As needed Lopressor and hydralazine ordered -Hydralazine 10 mg daily on hold  Atrial fibrillation (HCC) -Currently in sinus but tachycardic -Start Coreg 6.25 p.o. twice daily -Last dose of Eliquis 01/21/2023 at 7 PM.  Will hold  T2DM -Sliding scale insulin initiated -Glipizide 10 mg daily on hold  Hypothyroidism -Synthroid 50 mg p.o. daily on hold  Recent CVA/ left sided weakness -Atorvastatin 80 mg p.o. q. of sleep, Eliquis twice daily on hold   Ebubechukwu Jedlicka 01/22/2023, 4:10 AM

## 2023-01-23 ENCOUNTER — Inpatient Hospital Stay (HOSPITAL_COMMUNITY): Payer: MEDICAID

## 2023-01-23 DIAGNOSIS — N185 Chronic kidney disease, stage 5: Secondary | ICD-10-CM

## 2023-01-23 DIAGNOSIS — I161 Hypertensive emergency: Secondary | ICD-10-CM

## 2023-01-23 DIAGNOSIS — R0603 Acute respiratory distress: Secondary | ICD-10-CM

## 2023-01-23 DIAGNOSIS — I509 Heart failure, unspecified: Secondary | ICD-10-CM

## 2023-01-23 LAB — ECHOCARDIOGRAM COMPLETE
AR max vel: 3.75 cm2
AV Area VTI: 3.68 cm2
AV Area mean vel: 3.62 cm2
AV Mean grad: 4 mmHg
AV Peak grad: 7.2 mmHg
Ao pk vel: 1.34 m/s
Area-P 1/2: 4.36 cm2
S' Lateral: 3.8 cm
Weight: 3421.54 oz

## 2023-01-23 LAB — GLUCOSE, CAPILLARY
Glucose-Capillary: 100 mg/dL — ABNORMAL HIGH (ref 70–99)
Glucose-Capillary: 137 mg/dL — ABNORMAL HIGH (ref 70–99)
Glucose-Capillary: 201 mg/dL — ABNORMAL HIGH (ref 70–99)
Glucose-Capillary: 121 mg/dL — ABNORMAL HIGH (ref 70–99)
Glucose-Capillary: 120 mg/dL — ABNORMAL HIGH (ref 70–99)

## 2023-01-23 LAB — APTT
aPTT: 64 seconds — ABNORMAL HIGH (ref 24–36)
aPTT: 67 seconds — ABNORMAL HIGH (ref 24–36)
aPTT: 58 seconds — ABNORMAL HIGH (ref 24–36)

## 2023-01-23 LAB — CBC
HCT: 26.8 % — ABNORMAL LOW (ref 39.0–52.0)
Hemoglobin: 8.8 g/dL — ABNORMAL LOW (ref 13.0–17.0)
MCH: 31.2 pg (ref 26.0–34.0)
MCHC: 32.8 g/dL (ref 30.0–36.0)
MCV: 95 fL (ref 80.0–100.0)
Platelets: 220 10*3/uL (ref 150–400)
RBC: 2.82 MIL/uL — ABNORMAL LOW (ref 4.22–5.81)
RDW: 13 % (ref 11.5–15.5)
WBC: 7.2 10*3/uL (ref 4.0–10.5)
nRBC: 0 % (ref 0.0–0.2)

## 2023-01-23 LAB — MAGNESIUM: Magnesium: 1.8 mg/dL (ref 1.7–2.4)

## 2023-01-23 LAB — RENAL FUNCTION PANEL
Albumin: 2.6 g/dL — ABNORMAL LOW (ref 3.5–5.0)
Anion gap: 12 (ref 5–15)
BUN: 91 mg/dL — ABNORMAL HIGH (ref 8–23)
CO2: 13 mmol/L — ABNORMAL LOW (ref 22–32)
Calcium: 7.2 mg/dL — ABNORMAL LOW (ref 8.9–10.3)
Chloride: 115 mmol/L — ABNORMAL HIGH (ref 98–111)
Creatinine, Ser: 6.29 mg/dL — ABNORMAL HIGH (ref 0.61–1.24)
GFR, Estimated: 9 mL/min — ABNORMAL LOW (ref >60)
Glucose, Bld: 121 mg/dL — ABNORMAL HIGH (ref 70–99)
Phosphorus: 6.3 mg/dL — ABNORMAL HIGH (ref 2.5–4.6)
Potassium: 4.6 mmol/L (ref 3.5–5.1)
Sodium: 140 mmol/L (ref 135–145)

## 2023-01-23 LAB — HEPARIN LEVEL (UNFRACTIONATED): Heparin Unfractionated: 0.97 IU/mL — ABNORMAL HIGH (ref 0.30–0.70)

## 2023-01-23 LAB — FOLATE: Folate: 7.8 ng/mL (ref >5.9)

## 2023-01-23 MED ORDER — HYDRALAZINE HCL 20 MG/ML IJ SOLN
10.0000 mg | INTRAMUSCULAR | Status: DC | PRN
  Administered 2023-01-27: 10 mg via INTRAVENOUS
  Filled 2023-01-23: qty 1

## 2023-01-23 MED ORDER — AMLODIPINE BESYLATE 5 MG PO TABS
5.0000 mg | ORAL_TABLET | Freq: Once | ORAL | Status: AC
  Administered 2023-01-23: 5 mg via ORAL
  Filled 2023-01-23: qty 1

## 2023-01-23 MED ORDER — AMLODIPINE BESYLATE 10 MG PO TABS
10.0000 mg | ORAL_TABLET | Freq: Every day | ORAL | Status: DC
  Administered 2023-01-24 – 2023-01-27 (×4): 10 mg via ORAL
  Filled 2023-01-23 (×4): qty 1

## 2023-01-23 NOTE — Progress Notes (Signed)
Upper extremity vein mapping study completed.   Please see CV Proc for preliminary results.   Jaxson Keener, RDMS, RVT  

## 2023-01-23 NOTE — Progress Notes (Signed)
Bipap not needed at this time. Pt resting comfortably. 

## 2023-01-23 NOTE — Progress Notes (Signed)
   01/23/23 1937  BiPAP/CPAP/SIPAP  Reason BIPAP/CPAP not in use Other(comment) (Bipap not indicated at this time.)

## 2023-01-23 NOTE — Progress Notes (Signed)
Patient ID: [name], male   DOB: May 03, 1951, 72 y.o.   MRN: [id number] S: Feels well and breathing better.  O:BP (!) 171/84 (BP Location: Left Arm)   Pulse 76   Temp 98.3 F (36.8 C) (Oral)   Resp 17   Wt 97 kg   SpO2 97%   BMI 29.83 kg/m   Intake/Output Summary (Last 24 hours) at 01/23/2023 0845 Last data filed at 01/23/2023 0500 Gross per 24 hour  Intake 887.15 ml  Output 2150 ml  Net -1262.85 ml   Intake/Output: I/O last 3 completed shifts: In: 887.2 [P.O.:660; I.V.:227.2] Out: 2400 [Urine:2400]  Intake/Output this shift:  No intake/output data recorded. Weight change: -1.7 kg Gen:NAD CVS: RRR Resp:CTA Abd: +BS, soft, NT/ND Ext: no edema  Recent Labs  Lab 01/22/23 0606 01/23/23 0025  NA 140 140  K 4.1 4.6  CL 116* 115*  CO2 12* 13*  GLUCOSE 45* 121*  BUN 85* 91*  CREATININE 6.07* 6.29*  ALBUMIN 2.7* 2.6*  CALCIUM 7.3* 7.2*  PHOS  --  6.3*  AST 20  --   ALT 21  --    Liver Function Tests: Recent Labs  Lab 01/22/23 0606 01/23/23 0025  AST 20  --   ALT 21  --   ALKPHOS 74  --   BILITOT 0.7  --   PROT 6.1*  --   ALBUMIN 2.7* 2.6*   No results for input(s): "LIPASE", "AMYLASE" in the last 168 hours. No results for input(s): "AMMONIA" in the last 168 hours. CBC: Recent Labs  Lab 01/22/23 0608 01/23/23 0025  WBC 9.1 7.2  NEUTROABS 7.0  --   HGB 9.2* 8.8*  HCT 28.3* 26.8*  MCV 93.1 95.0  PLT 209 220   Cardiac Enzymes: No results for input(s): "CKTOTAL", "CKMB", "CKMBINDEX", "TROPONINI" in the last 168 hours. CBG: Recent Labs  Lab 01/22/23 1206 01/22/23 1241 01/22/23 1659 01/22/23 2054 01/23/23 0609  GLUCAP 53* 99 139* 139* 100*    Iron Studies:  Recent Labs    01/22/23 1359  IRON 44*  TIBC 216*   Studies/Results: US RENAL  Result Date: 01/22/2023 CLINICAL DATA:  Acute kidney injury EXAM: RENAL / URINARY TRACT ULTRASOUND COMPLETE COMPARISON:  CT abdomen pelvis 12/06/2022 FINDINGS: Right Kidney: Renal measurements: 11.4  x 4.9 x 4.7 cm = volume: 136 mL. Mildly thinned, echogenic renal cortex. No hydronephrosis. Left Kidney: Renal measurements: 10.8 x 6.1 x 5.5 cm = volume: 190 mL. Mildly thinned, echogenic renal cortex. No hydronephrosis. Bladder: Appears normal for degree of bladder distention. Other: None. IMPRESSION: Thinned echogenic renal cortices consistent with chronic medical renal disease. Electronically Signed   By: Acquanetta Belling M.D.   On: 01/22/2023 10:27   DG CHEST PORT 1 VIEW  Result Date: 01/22/2023 CLINICAL DATA:  Shortness of breath EXAM: PORTABLE CHEST 1 VIEW COMPARISON:  10/24/2010 and overlapping portions CT abdomen 12/06/2022 FINDINGS: Upper normal heart size. Basilar and infrahilar patchy densities suspicious for acute pulmonary edema given the elevated BNP. Bibasilar pneumonia is a less likely differential diagnostic consideration. Mildly prominent right hilum, probably vascular, less likely from adenopathy. 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 Lower thoracic spondylosis. Costophrenic angles partially excluded although no obvious blunting of the visualized portion observed. IMPRESSION: 1. Patchy basilar and infrahilar densities suspicious for acute pulmonary edema given the elevated BNP. Bibasilar pneumonia is a less likely differential diagnostic consideration. 2. Mildly prominent right hilum, probably vascular, less likely from adenopathy. 3. 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 Electronically Signed  By: Gaylyn Rong M.D.   On: 01/22/2023 09:33    amLODipine  5 mg Oral Daily   feeding supplement  237 mL Oral BID BM   furosemide  80 mg Intravenous Q12H   insulin aspart  0-15 Units Subcutaneous TID WC   insulin aspart  0-5 Units Subcutaneous QHS   metoprolol succinate  25 mg Oral Daily   multivitamin with minerals  1 tablet Oral Daily    BMET    Component Value Date/Time   NA 140 01/23/2023 0025   K 4.6 01/23/2023 0025   CL 115 (H) 01/23/2023 0025   CO2  13 (L) 01/23/2023 0025   GLUCOSE 121 (H) 01/23/2023 0025   BUN 91 (H) 01/23/2023 0025   CREATININE 6.29 (H) 01/23/2023 0025   CALCIUM 7.2 (L) 01/23/2023 0025   CALCIUM (LL) 09/06/2010 2140    5.7 Result repeated and verified. CRITICAL RESULT CALLED TO, READ BACK BY AND VERIFIED WITH: L FRIESEN,RN 1423 09/07/10 D BRADLEY   GFRNONAA 9 (L) 01/23/2023 0025   GFRAA 56 (L) 08/31/2019 1200   CBC    Component Value Date/Time   WBC 7.2 01/23/2023 0025   RBC 2.82 (L) 01/23/2023 0025   HGB 8.8 (L) 01/23/2023 0025   HCT 26.8 (L) 01/23/2023 0025   PLT 220 01/23/2023 0025   MCV 95.0 01/23/2023 0025   MCH 31.2 01/23/2023 0025   MCHC 32.8 01/23/2023 0025   RDW 13.0 01/23/2023 0025   LYMPHSABS 1.4 01/22/2023 0608   MONOABS 0.6 01/22/2023 0608   EOSABS 0.1 01/22/2023 0608   BASOSABS 0.1 01/22/2023 0608     Assessment/Plan:  AKI/CKD stage IV - presumably due to decompensated CHF vs progressive CKD to stage V.  Will continue with IV lasix and follow his renal response to diuresis.  No urgent indication for dialysis at this time.  I did speak with his daughters, and discussed that if he does not respond, he will require initiation of HD which will likely be permanent.  Will consult VVS to evaluate for AVF/AVG placement during this hospitalization +/- TDC if needed.  Avoid nephrotoxic medications including NSAIDs and iodinated intravenous contrast exposure unless the latter is absolutely indicated.  Preferred narcotic agents for pain control are hydromorphone, fentanyl, and methadone. Morphine should not be used. Avoid Baclofen and avoid oral sodium phosphate and magnesium citrate based laxatives / bowel preps. Continue strict Input and Output monitoring. Will monitor the patient closely with you and intervene or adjust therapy as indicated by changes in clinical status/labs  Acute hypoxic respiratory failure - due to pulmonary edema.  ECHO pending.  Off of BiPAP.  Continue with diuresis and follow.   Bronchodilators per primary svc. Anemia of CKD stage IV - will check iron stores.  Hold off on ESA for now given acute CHF Acute on chronic CHF - diuresing.  ECHO pending. AGMA - due to #1.  Hold off on sodium bicarbonate until volume improves.  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  Irena Cords, MD [facility]

## 2023-01-23 NOTE — Consult Note (Signed)
Hospital Consult    Reason for Consult: Hemodialysis access Referring Physician: Dr. Arrie Aran MRN #:  [id number]  History of Present Illness: This is a 72 y.o. male here with recent stroke with known atrial fibrillation on heparin drip currently with worsening renal function.  Patient originally from [place] does not speak English but does comprehend conversation well.  He is right-hand dominant without previous left upper extremity or pacemaker surgery.  He has never had dialysis access before but was temporarily on hemodialysis over 10 years ago.  Past Medical History:  Diagnosis Date   Diabetes mellitus    HLD (hyperlipidemia)    Hypertension     Past Surgical History:  Procedure Laterality Date   APPENDECTOMY     ruptured appendix 13 years ago did not close wound   HERNIA REPAIR     11 yrs ago    OPEN REDUCTION INTERNAL FIXATION (ORIF) DISTAL RADIAL FRACTURE Left 09/03/2019   Procedure: OPEN REDUCTION INTERNAL FIXATION (ORIF) DISTAL RADIAL FRACTURE;  Surgeon: Mack Hook, MD;  Location: [facility];  Service: Orthopedics;  Laterality: Left;    No Known Allergies  Prior to Admission medications   Medication Sig Start Date End Date Taking? Authorizing Provider  acetaminophen (TYLENOL) 325 MG tablet Take 2 tablets (650 mg total) by mouth every 6 (six) hours. 09/03/19   Mack Hook, MD  amLODipine (NORVASC) 10 MG tablet Take 10 mg by mouth daily.    [provider]  apixaban (ELIQUIS) 2.5 MG TABS tablet Take 2.5 mg by mouth 2 (two) times daily. 01/20/23   [provider]  aspirin 81 MG tablet Take 81 mg by mouth daily.      [provider]  atorvastatin (LIPITOR) 40 MG tablet Take 40 mg by mouth daily.    [provider]  atorvastatin (LIPITOR) 80 MG tablet Take 80 mg by mouth daily. 01/21/23 02/20/23  [provider]  benazepril (LOTENSIN) 40 MG tablet Take 40 mg by mouth daily.    [provider]   glipiZIDE (GLUCOTROL) 10 MG tablet Take 10 mg by mouth daily before breakfast.    [provider]  ibuprofen (ADVIL) 200 MG tablet Take 3 tablets (600 mg total) by mouth every 6 (six) hours. 09/03/19   Mack Hook, MD  levothyroxine (SYNTHROID) 75 MCG tablet Take 75 mcg by mouth daily before breakfast.    [provider]  metoprolol succinate (TOPROL-XL) 25 MG 24 hr tablet Take by mouth. 01/20/23 02/19/23  [provider]  oxyCODONE (ROXICODONE) 5 MG immediate release tablet Take 1 tablet (5 mg total) by mouth every 6 (six) hours as needed for severe pain. 09/03/19   Mack Hook, MD  simvastatin (ZOCOR) 20 MG tablet Take 20 mg by mouth daily.    [provider]    Social History   Socioeconomic History   Marital status: Married    Spouse name: Not on file   Number of children: 5   Years of education: Not on file   Highest education level: Not on file  Occupational History    Employer: UNEMPLOYED  Tobacco Use   Smoking status: Never   Smokeless tobacco: Never  Vaping Use   Vaping Use: Never used  Substance and Sexual Activity   Alcohol use: No   Drug use: No   Sexual activity: Not on file  Other Topics Concern   Not on file  Social History Narrative   Not on file   Social Determinants of Health  Financial Resource Strain: Not on file  Food Insecurity: No Food Insecurity (01/22/2023)   Hunger Vital Sign    Worried About Running Out of Food in the Last Year: Never true    Ran Out of Food in the Last Year: Never true  Transportation Needs: No Transportation Needs (01/22/2023)   PRAPARE - Administrator, Civil Service (Medical): No    Lack of Transportation (Non-Medical): No  Physical Activity: Not on file  Stress: Not on file  Social Connections: Not on file  Intimate Partner Violence: Not At Risk (01/22/2023)   Humiliation, Afraid, Rape, and Kick questionnaire    Fear of Current or Ex-Partner: No    Emotionally Abused: No     Physically Abused: No    Sexually Abused: No    No family history on file.  Review of Systems  Constitutional: Negative.   HENT: Negative.    Eyes: Negative.   Respiratory: Negative.    Cardiovascular: Negative.   Gastrointestinal: Negative.   Musculoskeletal: Negative.   Skin: Negative.   Neurological: Negative.   Endo/Heme/Allergies:  Bruises/bleeds easily.  Psychiatric/Behavioral: Negative.       Physical Examination  Vitals:   01/23/23 0411 01/23/23 0716  BP: (!) 181/93 (!) 171/84  Pulse: 78 76  Resp: 20 17  Temp: 97.9 F (36.6 C) 98.3 F (36.8 C)  SpO2: 95% 97%   Body mass index is 29.83 kg/m.  Physical Exam HENT:     Head: Normocephalic.     Mouth/Throat:     Mouth: Mucous membranes are moist.  Cardiovascular:     Pulses:          Radial pulses are 2+ on the right side and 2+ on the left side.  Pulmonary:     Effort: Pulmonary effort is normal.  Musculoskeletal:     Right lower leg: No edema.     Left lower leg: No edema.  Skin:    General: Skin is warm.     Capillary Refill: Capillary refill takes less than 2 seconds.  Neurological:     General: No focal deficit present.     Mental Status: He is alert.  Psychiatric:        Mood and Affect: Mood normal.       CBC    Component Value Date/Time   WBC 7.2 01/23/2023 0025   RBC 2.82 (L) 01/23/2023 0025   HGB 8.8 (L) 01/23/2023 0025   HCT 26.8 (L) 01/23/2023 0025   PLT 220 01/23/2023 0025   MCV 95.0 01/23/2023 0025   MCH 31.2 01/23/2023 0025   MCHC 32.8 01/23/2023 0025   RDW 13.0 01/23/2023 0025   LYMPHSABS 1.4 01/22/2023 0608   MONOABS 0.6 01/22/2023 0608   EOSABS 0.1 01/22/2023 0608   BASOSABS 0.1 01/22/2023 0608    BMET    Component Value Date/Time   NA 140 01/23/2023 0025   K 4.6 01/23/2023 0025   CL 115 (H) 01/23/2023 0025   CO2 13 (L) 01/23/2023 0025   GLUCOSE 121 (H) 01/23/2023 0025   BUN 91 (H) 01/23/2023 0025   CREATININE 6.29 (H) 01/23/2023 0025   CALCIUM 7.2 (L)  01/23/2023 0025   CALCIUM (LL) 09/06/2010 2140    5.7 Result repeated and verified. CRITICAL RESULT CALLED TO, READ BACK BY AND VERIFIED WITH: L FRIESEN,RN 1423 09/07/10 D BRADLEY   GFRNONAA 9 (L) 01/23/2023 0025   GFRAA 56 (L) 08/31/2019 1200    COAGS: Lab Results  Component Value Date  INR 1.3 (H) 01/22/2023   INR 1.29 10/24/2010   INR 1.17 09/17/2010     Non-Invasive Vascular Imaging:   Vein mapping ordered  ASSESSMENT/PLAN: This is a 72 y.o. male with acute on chronic kidney disease consult for permanent dialysis access.  Vein mapping ordered.  Certainly if patient requires dialysis as an inpatient we will plan for tunneled dialysis catheter permanent access otherwise he will need less urgent permanent access either early next week or could be performed as an outpatient given the constraints of current CRNA situation at [facility].  Will follow-up vein mapping discussed with nephrology in the coming days.  Dinero Chavira C. Randie Heinz, MD [facility] Office: [phone] Pager: [phone]

## 2023-01-23 NOTE — Progress Notes (Signed)
ANTICOAGULATION CONSULT NOTE  Pharmacy Consult for heparin Indication: atrial fibrillation (CVA 6/27)  No Known Allergies  Patient Measurements: Weight: 97 kg (213 lb 13.5 oz) Heparin Dosing Weight: 95.7 kg  Vital Signs: Temp: 98.3 F (36.8 C) (07/04 0716) Temp Source: Oral (07/04 0716) BP: 171/84 (07/04 0716) Pulse Rate: 76 (07/04 0716)  Labs: Recent Labs    01/22/23 0606 01/22/23 1191 01/22/23 0820 01/22/23 1012 01/22/23 1012 01/22/23 2030 01/23/23 0025 01/23/23 0601  HGB  --  9.2*  --   --   --   --  8.8*  --   HCT  --  28.3*  --   --   --   --  26.8*  --   PLT  --  209  --   --   --   --  220  --   APTT  --   --   --  38*   < > 47* 64* 67*  LABPROT  --   --   --  16.8*  --   --   --   --   INR  --   --   --  1.3*  --   --   --   --   HEPARINUNFRC  --   --   --  >1.10*  --   --  0.97*  --   CREATININE 6.07*  --   --   --   --   --  6.29*  --   TROPONINIHS  --  35* 40*  --   --   --   --   --    < > = values in this interval not displayed.     Estimated Creatinine Clearance: 12.2 mL/min (A) (by C-G formula based on SCr of 6.29 mg/dL (H)).   Assessment: Patient is a 72 year old male presenting with SOB. 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 Likely a new start HD patient.  Apixaban on hold and transitioned to heparin   aPTT this morning is therapeutic at 67 seconds, No issues with bleeding or infusion reported. Hgb and platelets stable  Goal of Therapy:  Heparin level 0.3-0.5 units/ml aPTT 66-85 seconds Monitor platelets by anticoagulation protocol: Yes   Plan:  Continue heparin at 1450 un/hr Check anti-Xa level daily while on heparin Continue to monitor H&H and platelets   Thank you for allowing pharmacy to be a part of this patient's care.   Signe Colt, PharmD 01/23/2023 10:10 AM  **Pharmacist phone directory can be found on [email or web address] listed under [facility]**  Thank you for allowing pharmacy to be a part of  this patient's care.   Signe Colt, PharmD 01/23/2023 7:53 AM  **Pharmacist phone directory can be found on [email or web address] listed under [facility]**

## 2023-01-23 NOTE — Plan of Care (Signed)
  Problem: Activity: Goal: Risk for activity intolerance will decrease Outcome: Progressing   Problem: Nutrition: Goal: Adequate nutrition will be maintained Outcome: Progressing   Problem: Coping: Goal: Level of anxiety will decrease Outcome: Progressing   Problem: Pain Managment: Goal: General experience of comfort will improve Outcome: Progressing   Problem: Safety: Goal: Ability to remain free from injury will improve Outcome: Progressing   Problem: Skin Integrity: Goal: Risk for impaired skin integrity will decrease Outcome: Progressing   Problem: Metabolic: Goal: Ability to maintain appropriate glucose levels will improve Outcome: Progressing   Problem: Health Behavior/Discharge Planning: Goal: Ability to manage health-related needs will improve Outcome: Not Progressing

## 2023-01-23 NOTE — Progress Notes (Signed)
PROGRESS NOTE    [name]  ZOX:[id number] DOB: 1951-03-25 DOA: 01/22/2023 PCP: System, Provider Not In   Brief Narrative:  72 year old with history of past medical history of HTN, DM2, paroxysmal A-fib on Eliquis, CKD stage IV, recent CVA admitted to [facility] on Friday and diagnosed with CVA with residual left upper and lower extremity weakness. Eventually patient went home and then suddenly developed shortness of breath with change in mental status at home. Patient was found to have elevated BNP, creatinine of 5.96.  Hospitalist called for admission, nephrology team called in consult.  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 Ef 55% Hypertensive emergency -Blood pressure more appropriately controlled  -Able to wean off BiPAP, off nasal cannula as well today -Continue IV Lasix, metoprolol -EKG without profound ST changes -Troponin minimally elevated not consistent with ACS -Continue supportive care, nebs, oxygen   Acute on chronic kidney disease, stage IV Metabolic acidosis -Likely prerenal in the setting of hypertensive urgency -Hold home ACE inhibitor -Baseline creatinine around 2.5, currently 6.3 and not improving -Being evaluated by vascular surgery for possible fistula placement/discussion -Nephrology consulted   Hypertensive emergency -In the setting of AKI, respiratory distress and hypoxia -Improving with medication changes -increase amlodipine, as needed hydralazine   History of paroxysmal atrial fibrillation -Continue heparin drip, med rec still pending   Non-insulin-dependent  diabetes type 2, well-controlled -A1c 5.6, continue sliding scale insulin and hypoglycemic protocol   Hypothyroidism -Resume home Synthroid pending dose verification   Recent CVA/ left sided weakness -Start low-dose aspirin and statin until home medications can be verified   DVT prophylaxis: Heparin drip Code Status: Full Family Communication: Wife at bedside, daughter over the phone  Status is: Inpatient  Dispo: The patient is from: Home              Anticipated d/c is to: Home              Anticipated d/c date is: 24 to 48 hours              Patient currently not medically stable for discharge  Consultants:  Vascular surgery, nephrology  Procedures:  None  Antimicrobials:  None indicated  Subjective: No acute issues or events overnight patient denies nausea vomiting diarrhea constipation headache fevers chills or chest pain.  Reports respiratory status is markedly improved since admission  Objective: Vitals:   01/22/23 1922 01/23/23 0009 01/23/23 0411 01/23/23 0716  BP: (!) 178/89 (!) 178/94 (!) 181/93 (!) 171/84  Pulse: 78 74 78 76  Resp: (!) 21 (!) 21 20 17   Temp: (!) 97.4 F (36.3 C) 98 F (36.7 C) 97.9 F (36.6 C) 98.3 F (36.8 C)  TempSrc: Oral Oral Oral Oral  SpO2: 97% 96% 95% 97%  Weight:  97 kg      Intake/Output Summary (Last 24 hours) at 01/23/2023 0751 Last data filed at 01/23/2023 0500 Gross per 24 hour  Intake 887.15 ml  Output 2400 ml  Net -1512.85 ml   Filed Weights   01/22/23 0522 01/22/23 0646 01/23/23 0009  Weight: 98.7 kg 99.3 kg 97 kg    Examination:  General exam: Appears calm and comfortable  Respiratory system: Clear to auscultation. Respiratory effort normal. Cardiovascular system: S1 & S2 heard, RRR. No JVD, murmurs, rubs, gallops or clicks. No pedal edema. Gastrointestinal system: Abdomen is nondistended, soft and nontender. No organomegaly or masses felt. Normal bowel sounds heard. Central nervous system: Alert and oriented.  No focal neurological deficits. Extremities: Symmetric 5 x 5 power. Skin: No rashes, lesions or ulcers Psychiatry: Judgement and insight appear normal. Mood & affect appropriate.     Data Reviewed: I have personally reviewed following labs and imaging studies  CBC: Recent Labs  Lab 01/22/23 0608 01/23/23 0025  WBC 9.1 7.2  NEUTROABS 7.0  --   HGB 9.2* 8.8*  HCT 28.3* 26.8*  MCV 93.1 95.0  PLT 209 220   Basic Metabolic Panel: Recent Labs  Lab 01/22/23 0606 01/23/23 0025  NA 140 140  K 4.1 4.6  CL 116* 115*  CO2 12* 13*  GLUCOSE 45* 121*  BUN 85* 91*  CREATININE 6.07* 6.29*  CALCIUM 7.3* 7.2*  MG 1.8 1.8  PHOS  --  6.3*   GFR: Estimated Creatinine Clearance: 12.2 mL/min (A) (by C-G formula based on SCr of 6.29 mg/dL (H)). Liver Function Tests: Recent Labs  Lab 01/22/23 0606 01/23/23 0025  AST 20  --   ALT 21  --   ALKPHOS 74  --   BILITOT 0.7  --   PROT 6.1*  --   ALBUMIN 2.7* 2.6*   No results for input(s): "LIPASE", "AMYLASE" in the last 168 hours. No results for input(s): "AMMONIA" in the last 168 hours. Coagulation Profile: Recent Labs  Lab 01/22/23 1012  INR 1.3*   Cardiac Enzymes: No results for input(s): "CKTOTAL", "CKMB", "CKMBINDEX", "TROPONINI" in the last 168 hours. BNP (last 3 results) No results for input(s): "PROBNP" in the last 8760 hours. HbA1C: Recent Labs    01/22/23 0606  HGBA1C 5.6   CBG: Recent Labs  Lab 01/22/23 1206 01/22/23 1241 01/22/23 1659 01/22/23 2054 01/23/23 0609  GLUCAP 53* 99 139* 139* 100*   Lipid Profile: No results for input(s): "CHOL", "HDL", "LDLCALC", "TRIG", "CHOLHDL", "LDLDIRECT" in the last 72 hours. Thyroid Function Tests: No results for input(s): "TSH", "T4TOTAL", "FREET4", "T3FREE", "THYROIDAB" in the last 72 hours. Anemia Panel: Recent Labs    01/22/23 1359 01/23/23 0025  FOLATE  --  7.8  TIBC 216*  --   IRON 44*  --    Sepsis Labs: Recent Labs  Lab 01/22/23 0606 01/22/23 0820   LATICACIDVEN 0.7 0.7    No results found for this or any previous visit (from the past 240 hour(s)).       Radiology Studies: US RENAL  Result Date: 01/22/2023 CLINICAL DATA:  Acute kidney injury EXAM: RENAL / URINARY TRACT ULTRASOUND COMPLETE COMPARISON:  CT abdomen pelvis 12/06/2022 FINDINGS: Right Kidney: Renal measurements: 11.4 x 4.9 x 4.7 cm = volume: 136 mL. Mildly thinned, echogenic renal cortex. No hydronephrosis. Left Kidney: Renal measurements: 10.8 x 6.1 x 5.5 cm = volume: 190 mL. Mildly thinned, echogenic renal cortex. No hydronephrosis. Bladder: Appears normal for degree of bladder distention. Other: None. IMPRESSION: Thinned echogenic renal cortices consistent with chronic medical renal disease. Electronically Signed   By: Acquanetta Belling M.D.   On: 01/22/2023 10:27   DG CHEST PORT 1 VIEW  Result Date: 01/22/2023 CLINICAL DATA:  Shortness of breath EXAM: PORTABLE CHEST 1 VIEW COMPARISON:  10/24/2010 and overlapping portions CT abdomen 12/06/2022 FINDINGS: Upper normal heart size. Basilar and  infrahilar patchy densities suspicious for acute pulmonary edema given the elevated BNP. Bibasilar pneumonia is a less likely differential diagnostic consideration. Mildly prominent right hilum, probably vascular, less likely from adenopathy. 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 Lower thoracic spondylosis. Costophrenic angles partially excluded although no obvious blunting of the visualized portion observed. IMPRESSION: 1. Patchy basilar and infrahilar densities suspicious for acute pulmonary edema given the elevated BNP. Bibasilar pneumonia is a less likely differential diagnostic consideration. 2. Mildly prominent right hilum, probably vascular, less likely from adenopathy. 3. 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 Electronically Signed   By: Gaylyn Rong M.D.   On: 01/22/2023 09:33     Scheduled Meds:  amLODipine  5 mg Oral Daily   feeding supplement  237 mL  Oral BID BM   furosemide  80 mg Intravenous Q12H   insulin aspart  0-15 Units Subcutaneous TID WC   insulin aspart  0-5 Units Subcutaneous QHS   metoprolol succinate  25 mg Oral Daily   multivitamin with minerals  1 tablet Oral Daily   Continuous Infusions:  heparin 1,450 Units/hr (01/23/23 0500)     LOS: 1 day   Time spent: 55 min was spent at bedside discussing case with family, as well as reviewing chart  Azucena Fallen, DO [facility]  If 7PM-7AM, please contact night-coverage [email or web address]  01/23/2023, 7:51 AM

## 2023-01-23 NOTE — Evaluation (Signed)
Occupational Therapy Evaluation Patient Details Name: [name] MRN: [id number] DOB: October 09, 1950 Today's Date: 01/23/2023   History of Present Illness 72 y.o. male presents to [facility] hospital on 01/22/2023 with SOB and AMS, found to be in acute respiratory failure 2/2 pulmonary edema along with acute diastolic congestive heart failure. 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 PMH includes HTN, DMII, PAF, CKD IV.   Clinical Impression   Patient admitted for the diagnosis above.  PTA he lives with family, and has all needed assist at home.  Daughter and patient endorse the patient is very close to his baseline, and no acute OT needs exist.  Family expresses no need for post acute OT.  OT will sign off.      Recommendations for follow up therapy are one component of a multi-disciplinary discharge planning process, led by the attending physician.  Recommendations may be updated based on patient status, additional functional criteria and insurance authorization.   Assistance Recommended at Discharge Intermittent Supervision/Assistance  Patient can return home with the following Assist for transportation;Assistance with cooking/housework    Functional Status Assessment  Patient has not had a recent decline in their functional status  Equipment Recommendations  None recommended by OT    Recommendations for Other Services       Precautions / Restrictions Precautions Precautions: Fall Restrictions Weight Bearing Restrictions: No      Mobility Bed Mobility               General bed mobility comments: Up in the recliner    Transfers Overall transfer level: Needs assistance Equipment used: Rolling walker (2 wheels) Transfers: Sit to/from Stand, Bed to chair/wheelchair/BSC Sit to Stand: Modified independent (Device/Increase time)     Step pivot transfers: Supervision            Balance Overall balance  assessment: Mild deficits observed, not formally tested                                         ADL either performed or assessed with clinical judgement   ADL Overall ADL's : At baseline                                             Vision Patient Visual Report: No change from baseline       Perception     Praxis      Pertinent Vitals/Pain Pain Assessment Pain Assessment: No/denies pain     Hand Dominance Right   Extremity/Trunk Assessment Upper Extremity Assessment Upper Extremity Assessment: Overall WFL for tasks assessed   Lower Extremity Assessment Lower Extremity Assessment: Defer to PT evaluation   Cervical / Trunk Assessment Cervical / Trunk Assessment: Normal   Communication Communication Communication: Prefers language other than [place];Interpreter utilized   Cognition Arousal/Alertness: Awake/alert Behavior During Therapy: WFL for tasks assessed/performed Overall Cognitive Status: Within Functional Limits for tasks assessed                                       General Comments       Exercises     Shoulder Instructions      Home  Living Family/patient expects to be discharged to:: Private residence Living Arrangements: Spouse/significant other;Children Available Help at Discharge: Family Type of Home: House Home Access: Stairs to enter Secretary/administrator of Steps: 3 Entrance Stairs-Rails: None Home Layout: One level     Bathroom Shower/Tub: Producer, television/film/video: Standard Bathroom Accessibility: Yes How Accessible: Accessible via walker Home Equipment: Agricultural consultant (2 wheels)          Prior Functioning/Environment Prior Level of Function : Independent/Modified Independent                        OT Problem List: Impaired balance (sitting and/or standing)      OT Treatment/Interventions:      OT Goals(Current goals can be found in the care plan section)  Acute Rehab OT Goals Patient Stated Goal: Return home OT Goal Formulation: With patient Time For Goal Achievement: 01/31/23 Potential to Achieve Goals: Good  OT Frequency:      Co-evaluation              AM-PAC OT "6 Clicks" Daily Activity     Outcome Measure Help from another person eating meals?: None Help from another person taking care of personal grooming?: None Help from another person toileting, which includes using toliet, bedpan, or urinal?: A Little Help from another person bathing (including washing, rinsing, drying)?: A Little Help from another person to put on and taking off regular upper body clothing?: None Help from another person to put on and taking off regular lower body clothing?: A Little 6 Click Score: 21   End of Session Equipment Utilized During Treatment: Gait belt Nurse Communication: Mobility status  Activity Tolerance: Patient tolerated treatment well Patient left: in chair;with call bell/phone within reach;with family/visitor present  OT Visit Diagnosis: Unsteadiness on feet (R26.81)                Time: 1610-9604 OT Time Calculation (min): 17 min Charges:  OT General Charges $OT Visit: 1 Visit OT Evaluation $OT Eval Moderate Complexity: 1 Mod  01/23/2023  RP, OTR/L  Acute Rehabilitation Services  Office:  [phone]   Suzanna Obey 01/23/2023, 9:18 AM

## 2023-01-23 NOTE — Progress Notes (Signed)
   01/23/23 0320  BiPAP/CPAP/SIPAP  Reason BIPAP/CPAP not in use Non-compliant   V-60 at bedside, patient is not in any distress and does not want to go on at this time.

## 2023-01-23 NOTE — Progress Notes (Signed)
ANTICOAGULATION CONSULT NOTE  Pharmacy Consult for heparin Indication: atrial fibrillation (CVA 6/27)  No Known Allergies  Patient Measurements: Weight: 97 kg (213 lb 13.5 oz) Heparin Dosing Weight: 95.7 kg  Vital Signs: Temp: 97.8 F (36.6 C) (07/04 1203) Temp Source: Oral (07/04 1203) BP: 185/96 (07/04 1203) Pulse Rate: 77 (07/04 1203)  Labs: Recent Labs    01/22/23 0606 01/22/23 1610 01/22/23 0820 01/22/23 1012 01/22/23 2030 01/23/23 0025 01/23/23 0601 01/23/23 1403  HGB  --  9.2*  --   --   --  8.8*  --   --   HCT  --  28.3*  --   --   --  26.8*  --   --   PLT  --  209  --   --   --  220  --   --   APTT  --   --   --  38*   < > 64* 67* 58*  LABPROT  --   --   --  16.8*  --   --   --   --   INR  --   --   --  1.3*  --   --   --   --   HEPARINUNFRC  --   --   --  >1.10*  --  0.97*  --   --   CREATININE 6.07*  --   --   --   --  6.29*  --   --   TROPONINIHS  --  35* 40*  --   --   --   --   --    < > = values in this interval not displayed.     Estimated Creatinine Clearance: 12.2 mL/min (A) (by C-G formula based on SCr of 6.29 mg/dL (H)).   Assessment: Patient is a 72 year old male presenting with SOB. 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 Likely a new start HD patient.  Apixaban on hold and transitioned to heparin   PTT this PM 58 seconds  Goal of Therapy:  Heparin level 0.3-0.5 units/ml aPTT 66-85 seconds Monitor platelets by anticoagulation protocol: Yes   Plan:  Increase heparin to 1550 units / hr Next PTT  with AM labs  Thank you Okey Regal, PharmD 01/23/2023,2:46 PM

## 2023-01-24 ENCOUNTER — Encounter (HOSPITAL_COMMUNITY): Payer: Self-pay | Admitting: Family Medicine

## 2023-01-24 ENCOUNTER — Inpatient Hospital Stay (HOSPITAL_COMMUNITY): Payer: MEDICAID

## 2023-01-24 DIAGNOSIS — G9341 Metabolic encephalopathy: Secondary | ICD-10-CM

## 2023-01-24 DIAGNOSIS — N184 Chronic kidney disease, stage 4 (severe): Secondary | ICD-10-CM

## 2023-01-24 DIAGNOSIS — I10 Essential (primary) hypertension: Secondary | ICD-10-CM

## 2023-01-24 DIAGNOSIS — E1129 Type 2 diabetes mellitus with other diabetic kidney complication: Secondary | ICD-10-CM

## 2023-01-24 DIAGNOSIS — I482 Chronic atrial fibrillation, unspecified: Secondary | ICD-10-CM

## 2023-01-24 DIAGNOSIS — N179 Acute kidney failure, unspecified: Secondary | ICD-10-CM

## 2023-01-24 HISTORY — PX: IR US GUIDE VASC ACCESS RIGHT: IMG2390

## 2023-01-24 HISTORY — PX: IR FLUORO GUIDE CV LINE RIGHT: IMG2283

## 2023-01-24 LAB — GLUCOSE, CAPILLARY
Glucose-Capillary: 110 mg/dL — ABNORMAL HIGH (ref 70–99)
Glucose-Capillary: 104 mg/dL — ABNORMAL HIGH (ref 70–99)
Glucose-Capillary: 152 mg/dL — ABNORMAL HIGH (ref 70–99)
Glucose-Capillary: 170 mg/dL — ABNORMAL HIGH (ref 70–99)
Glucose-Capillary: 166 mg/dL — ABNORMAL HIGH (ref 70–99)

## 2023-01-24 LAB — CBC
HCT: 27.8 % — ABNORMAL LOW (ref 39.0–52.0)
Hemoglobin: 9.1 g/dL — ABNORMAL LOW (ref 13.0–17.0)
MCH: 30.3 pg (ref 26.0–34.0)
MCHC: 32.7 g/dL (ref 30.0–36.0)
MCV: 92.7 fL (ref 80.0–100.0)
Platelets: 229 10*3/uL (ref 150–400)
RBC: 3 MIL/uL — ABNORMAL LOW (ref 4.22–5.81)
RDW: 12.9 % (ref 11.5–15.5)
WBC: 7.7 10*3/uL (ref 4.0–10.5)
nRBC: 0 % (ref 0.0–0.2)

## 2023-01-24 LAB — MAGNESIUM: Magnesium: 1.8 mg/dL (ref 1.7–2.4)

## 2023-01-24 LAB — RENAL FUNCTION PANEL
Albumin: 2.6 g/dL — ABNORMAL LOW (ref 3.5–5.0)
Anion gap: 12 (ref 5–15)
BUN: 95 mg/dL — ABNORMAL HIGH (ref 8–23)
CO2: 14 mmol/L — ABNORMAL LOW (ref 22–32)
Calcium: 7.2 mg/dL — ABNORMAL LOW (ref 8.9–10.3)
Chloride: 114 mmol/L — ABNORMAL HIGH (ref 98–111)
Creatinine, Ser: 6.58 mg/dL — ABNORMAL HIGH (ref 0.61–1.24)
GFR, Estimated: 8 mL/min — ABNORMAL LOW (ref >60)
Glucose, Bld: 125 mg/dL — ABNORMAL HIGH (ref 70–99)
Phosphorus: 5.7 mg/dL — ABNORMAL HIGH (ref 2.5–4.6)
Potassium: 4.5 mmol/L (ref 3.5–5.1)
Sodium: 140 mmol/L (ref 135–145)

## 2023-01-24 LAB — HEPATITIS B SURFACE ANTIGEN: Hepatitis B Surface Ag: NONREACTIVE

## 2023-01-24 LAB — APTT: aPTT: 79 seconds — ABNORMAL HIGH (ref 24–36)

## 2023-01-24 LAB — HEPARIN LEVEL (UNFRACTIONATED): Heparin Unfractionated: 0.71 IU/mL — ABNORMAL HIGH (ref 0.30–0.70)

## 2023-01-24 MED ORDER — LIDOCAINE-EPINEPHRINE 1 %-1:100000 IJ SOLN
INTRAMUSCULAR | Status: AC
  Filled 2023-01-24: qty 1

## 2023-01-24 MED ORDER — ATORVASTATIN CALCIUM 40 MG PO TABS
40.0000 mg | ORAL_TABLET | Freq: Every day | ORAL | Status: DC
  Administered 2023-01-24 – 2023-02-03 (×11): 40 mg via ORAL
  Filled 2023-01-24 (×11): qty 1

## 2023-01-24 MED ORDER — LIDOCAINE-EPINEPHRINE 1 %-1:100000 IJ SOLN
20.0000 mL | Freq: Once | INTRAMUSCULAR | Status: AC
  Administered 2023-01-24: 20 mL via INTRADERMAL

## 2023-01-24 MED ORDER — CEFAZOLIN SODIUM-DEXTROSE 2-4 GM/100ML-% IV SOLN
2.0000 g | Freq: Once | INTRAVENOUS | Status: AC
  Administered 2023-01-24: 2 g via INTRAVENOUS

## 2023-01-24 MED ORDER — CEFAZOLIN SODIUM-DEXTROSE 2-4 GM/100ML-% IV SOLN
INTRAVENOUS | Status: AC
  Filled 2023-01-24: qty 100

## 2023-01-24 MED ORDER — HEPARIN SODIUM (PORCINE) 1000 UNIT/ML IJ SOLN
INTRAMUSCULAR | Status: AC
  Filled 2023-01-24: qty 10

## 2023-01-24 MED ORDER — MIDAZOLAM HCL 2 MG/2ML IJ SOLN
INTRAMUSCULAR | Status: AC
  Filled 2023-01-24: qty 2

## 2023-01-24 MED ORDER — MIDAZOLAM HCL 2 MG/2ML IJ SOLN
INTRAMUSCULAR | Status: AC | PRN
  Administered 2023-01-24: 1 mg via INTRAVENOUS

## 2023-01-24 MED ORDER — LEVOTHYROXINE SODIUM 75 MCG PO TABS
75.0000 ug | ORAL_TABLET | Freq: Every day | ORAL | Status: DC
  Administered 2023-01-25 – 2023-02-03 (×10): 75 ug via ORAL
  Filled 2023-01-24 (×10): qty 1

## 2023-01-24 MED ORDER — CHLORHEXIDINE GLUCONATE CLOTH 2 % EX PADS
6.0000 | MEDICATED_PAD | Freq: Every day | CUTANEOUS | Status: DC
  Administered 2023-01-24 – 2023-02-03 (×11): 6 via TOPICAL

## 2023-01-24 MED ORDER — HEPARIN SODIUM (PORCINE) 1000 UNIT/ML IJ SOLN
10000.0000 [IU] | Freq: Once | INTRAMUSCULAR | Status: AC
  Administered 2023-01-24: 3200 [IU]

## 2023-01-24 NOTE — Consult Note (Signed)
Chief Complaint: CKD. Request is for [facility].  Referring Physician(s): Sutton,Ernest  Supervising Physician: Roanna Banning  Patient Status: [facility] - In-pt  History of Present Illness: Calib Alden Rosner is a 72 y.o. male inpatient. History of HTN, HLD, DM, PAF (on eliquis), CVA, Presented to the [facility] R with [facility] and AMS. Found to be ina cute on chronic kidney disease thought to be due to decompensated CHF. Patient was transferred to [facility] for nephrology. Team is requesting a tunneled dialysis catheter for dialysis access.  Daughter at bedside. Currently without any significant complaints. Patient alert and laying in bed,calm. Denies any fevers, headache, chest pain, SOB, cough, abdominal pain, nausea, vomiting or bleeding. Return precautions and treatment recommendations and follow-up discussed with the Patient and his daughter. Both who are agreeable with the plan.    Past Medical History:  Diagnosis Date   Diabetes mellitus    HLD (hyperlipidemia)    Hypertension     Past Surgical History:  Procedure Laterality Date   APPENDECTOMY     ruptured appendix 13 years ago did not close wound   HERNIA REPAIR     11 yrs ago    OPEN REDUCTION INTERNAL FIXATION (ORIF) DISTAL RADIAL FRACTURE Left 09/03/2019   Procedure: OPEN REDUCTION INTERNAL FIXATION (ORIF) DISTAL RADIAL FRACTURE;  Surgeon: Mack Hook, MD;  Location: [facility];  Service: Orthopedics;  Laterality: Left;    Allergies: Patient has no known allergies.  Medications: Prior to Admission medications   Medication Sig Start Date End Date Taking? Authorizing Provider  apixaban (ELIQUIS) 2.5 MG TABS tablet Take 2.5 mg by mouth 2 (two) times daily. 01/20/23  Yes [provider]  atorvastatin (LIPITOR) 40 MG tablet Take 40 mg by mouth daily.   Yes [provider]  glipiZIDE (GLUCOTROL) 10 MG tablet Take 10 mg by mouth daily before breakfast.   Yes [provider]   hydrALAZINE (APRESOLINE) 100 MG tablet Take 100 mg by mouth daily.   Yes [provider]  levothyroxine (SYNTHROID) 75 MCG tablet Take 75 mcg by mouth daily before breakfast.   Yes [provider]  metoprolol succinate (TOPROL-XL) 25 MG 24 hr tablet Take 25 mg by mouth daily. 01/20/23 02/19/23 Yes [provider]     No family history on file.  Social History   Socioeconomic History   Marital status: Married    Spouse name: Not on file   Number of children: 5   Years of education: Not on file   Highest education level: Not on file  Occupational History    Employer: UNEMPLOYED  Tobacco Use   Smoking status: Never   Smokeless tobacco: Never  Vaping Use   Vaping Use: Never used  Substance and Sexual Activity   Alcohol use: No   Drug use: No   Sexual activity: Not on file  Other Topics Concern   Not on file  Social History Narrative   Not on file   Social Determinants of Health   Financial Resource Strain: Not on file  Food Insecurity: No Food Insecurity (01/22/2023)   Hunger Vital Sign    Worried About Running Out of Food in the Last Year: Never true    Ran Out of Food in the Last Year: Never true  Transportation Needs: No Transportation Needs (01/22/2023)   PRAPARE - Administrator, Civil Service (Medical): No    Lack of Transportation (Non-Medical): No  Physical Activity: Not on file  Stress: Not on  file  Social Connections: Not on file    Review of Systems: A 12 point ROS discussed and pertinent positives are indicated in the HPI above.  All other systems are negative.  Review of Systems  Constitutional:  Negative for fever.  HENT:  Negative for congestion.   Respiratory:  Negative for cough and shortness of breath.   Cardiovascular:  Negative for chest pain.  Gastrointestinal:  Negative for abdominal pain.  Neurological:  Negative for headaches.  Psychiatric/Behavioral:  Negative for behavioral problems and confusion.      Vital Signs: BP (!) 172/93 (BP Location: Right Arm)   Pulse 78   Temp 98.1 F (36.7 C) (Oral)   Resp 20   Wt 220 lb 14.4 oz (100.2 kg)   SpO2 95%   BMI 30.81 kg/m   Physical Exam Vitals and nursing note reviewed.  Constitutional:      Appearance: He is well-developed. He is obese.  HENT:     Head: Normocephalic.  Cardiovascular:     Rate and Rhythm: Normal rate and regular rhythm.     Heart sounds: Normal heart sounds.  Pulmonary:     Effort: Pulmonary effort is normal.     Breath sounds: Normal breath sounds.  Musculoskeletal:        General: Normal range of motion.     Cervical back: Normal range of motion.  Skin:    General: Skin is warm and dry.     Coloration: Skin is jaundiced.  Neurological:     General: No focal deficit present.     Mental Status: He is alert and oriented to person, place, and time.  Psychiatric:        Mood and Affect: Mood normal.        Behavior: Behavior normal.        Thought Content: Thought content normal.        Judgment: Judgment normal.     Imaging: VAS [place] UPPER EXT VEIN MAPPING (PRE-OP AVF)  Result Date: 01/23/2023 UPPER EXTREMITY VEIN MAPPING Patient Name:  URIJAH BITTING  Date of Exam:   01/23/2023 Medical Rec #: [id number]              Accession #:    [id number] Date of Birth: [birth year]              Patient Gender: M Patient Age:   75 years Exam Location:  [facility] Procedure:      VAS [place] UPPER EXT VEIN MAPPING (PRE-OP AVF) Referring Phys: Lemar Livings --------------------------------------------------------------------------------  Indications: Pre-access. History: CKD 5.  Limitations: Bandaging, caliber of vessels Comparison Study: No prior studies. Performing Technologist: Jean Rosenthal RDMS, RVT  Examination Guidelines: A complete evaluation includes B-mode imaging, spectral Doppler, color Doppler, and power Doppler as needed of all accessible portions of each vessel. Bilateral testing is considered an integral  part of a complete examination. Limited examinations for reoccurring indications may be performed as noted. +-----------------+-------------+----------+--------------+ Right Cephalic   Diameter (cm)Depth (cm)   Findings    +-----------------+-------------+----------+--------------+ Shoulder             0.10        0.63                  +-----------------+-------------+----------+--------------+ Prox upper arm       0.12        0.40                  +-----------------+-------------+----------+--------------+ Mid upper arm  0.08        0.19                  +-----------------+-------------+----------+--------------+ Dist upper arm                          not visualized +-----------------+-------------+----------+--------------+ Antecubital fossa                         Bandaging    +-----------------+-------------+----------+--------------+ Prox forearm         0.36        0.38                  +-----------------+-------------+----------+--------------+ Mid forearm          0.31        0.17                  +-----------------+-------------+----------+--------------+ Dist forearm         0.30        0.23                  +-----------------+-------------+----------+--------------+ Wrist                0.20        0.59                  +-----------------+-------------+----------+--------------+ +-----------------+-------------+----------+---------+ Right Basilic    Diameter (cm)Depth (cm)Findings  +-----------------+-------------+----------+---------+ Mid upper arm        0.46                         +-----------------+-------------+----------+---------+ Dist upper arm       0.44                         +-----------------+-------------+----------+---------+ Antecubital fossa    0.34               branching +-----------------+-------------+----------+---------+ Prox forearm         0.36                          +-----------------+-------------+----------+---------+ Mid forearm          0.17                         +-----------------+-------------+----------+---------+ Distal forearm       0.19                         +-----------------+-------------+----------+---------+ Wrist                0.15                         +-----------------+-------------+----------+---------+ +-----------------+-------------+----------+--------------+ Left Cephalic    Diameter (cm)Depth (cm)   Findings    +-----------------+-------------+----------+--------------+ Shoulder                                not visualized +-----------------+-------------+----------+--------------+ Prox upper arm                          not visualized +-----------------+-------------+----------+--------------+ Mid upper arm  not visualized +-----------------+-------------+----------+--------------+ Dist upper arm                          not visualized +-----------------+-------------+----------+--------------+ Antecubital fossa                       not visualized +-----------------+-------------+----------+--------------+ Prox forearm         0.20        0.32                  +-----------------+-------------+----------+--------------+ Mid forearm          0.14        0.54                  +-----------------+-------------+----------+--------------+ Dist forearm         0.14        0.40                  +-----------------+-------------+----------+--------------+ Wrist                0.12        0.36                  +-----------------+-------------+----------+--------------+ +-----------------+-------------+----------+---------+ Left Basilic     Diameter (cm)Depth (cm)Findings  +-----------------+-------------+----------+---------+ Prox upper arm       0.58                         +-----------------+-------------+----------+---------+ Mid upper arm        0.29                          +-----------------+-------------+----------+---------+ Dist upper arm       0.36                         +-----------------+-------------+----------+---------+ Antecubital fossa    0.24                         +-----------------+-------------+----------+---------+ Prox forearm         0.18               branching +-----------------+-------------+----------+---------+ Mid forearm          0.18                         +-----------------+-------------+----------+---------+ Distal forearm       0.17                         +-----------------+-------------+----------+---------+ Wrist                0.17                         +-----------------+-------------+----------+---------+ *See table(s) above for measurements and observations.   Diagnosing physician:    Preliminary    ECHOCARDIOGRAM COMPLETE  Result Date: 01/23/2023    ECHOCARDIOGRAM REPORT   Patient Name:   NESBIT MURRA Date of Exam: 01/23/2023 Medical Rec #:  [id number]             Height:       71.0 in Accession #:    [id number]            Weight:       213.8 lb  Date of Birth:  [birth year]             BSA:          2.169 m Patient Age:    39 years              BP:           171/84 mmHg Patient Gender: M                     HR:           84 bpm. Exam Location:  Inpatient Procedure: 2D Echo, Color Doppler and Cardiac Doppler Indications:    CHF  History:        Patient has no prior history of Echocardiogram examinations.                 CHF, PE, CKD and Stroke, Arrythmias:Atrial Fibrillation; Risk                 Factors:Hypertension and Diabetes.  Sonographer:    Milbert Coulter Referring Phys: 56 DEBBY CROSLEY  Sonographer Comments: Image acquisition challenging due to respiratory motion and Image acquisition challenging due to patient body habitus. IMPRESSIONS  1. Left ventricular ejection fraction, by estimation, is 55 to 60%. The left ventricle has normal function. The left ventricle has no  regional wall motion abnormalities. There is moderate left ventricular hypertrophy. Left ventricular diastolic parameters are consistent with Grade I diastolic dysfunction (impaired relaxation).  2. Right ventricular systolic function is normal. The right ventricular size is normal.  3. The mitral valve is normal in structure. No evidence of mitral valve regurgitation. No evidence of mitral stenosis.  4. Degree of AR not well interrogated No suprasternal notch doppler and poor color flow but does not seen severe Consider f/u imaging for aortic root enlargment with CTA or MRA . The aortic valve is tricuspid. There is mild calcification of the aortic valve. There is mild thickening of the aortic valve. Aortic valve regurgitation is mild. Aortic valve sclerosis is present, with no evidence of aortic valve stenosis.  5. Aortic dilatation noted. There is severe dilatation of the aortic root, measuring 48 mm.  6. The inferior vena cava is normal in size with greater than 50% respiratory variability, suggesting right atrial pressure of 3 mmHg. FINDINGS  Left Ventricle: Left ventricular ejection fraction, by estimation, is 55 to 60%. The left ventricle has normal function. The left ventricle has no regional wall motion abnormalities. The left ventricular internal cavity size was normal in size. There is  moderate left ventricular hypertrophy. Left ventricular diastolic parameters are consistent with Grade I diastolic dysfunction (impaired relaxation). Right Ventricle: The right ventricular size is normal. No increase in right ventricular wall thickness. Right ventricular systolic function is normal. Left Atrium: Left atrial size was normal in size. Right Atrium: Right atrial size was normal in size. Pericardium: There is no evidence of pericardial effusion. Mitral Valve: The mitral valve is normal in structure. No evidence of mitral valve regurgitation. No evidence of mitral valve stenosis. Tricuspid Valve: The tricuspid  valve is normal in structure. Tricuspid valve regurgitation is not demonstrated. No evidence of tricuspid stenosis. Aortic Valve: Degree of AR not well interrogated No suprasternal notch doppler and poor color flow but does not seen severe Consider f/u imaging for aortic root enlargment with CTA or MRA. The aortic valve is tricuspid. There is mild calcification of the  aortic valve. There is mild thickening of the aortic valve.  Aortic valve regurgitation is mild. Aortic valve sclerosis is present, with no evidence of aortic valve stenosis. Aortic valve mean gradient measures 4.0 mmHg. Aortic valve peak gradient measures 7.2 mmHg. Aortic valve area, by VTI measures 3.68 cm. Pulmonic Valve: The pulmonic valve was normal in structure. Pulmonic valve regurgitation is not visualized. No evidence of pulmonic stenosis. Aorta: Aortic dilatation noted. There is severe dilatation of the aortic root, measuring 48 mm. Venous: The inferior vena cava is normal in size with greater than 50% respiratory variability, suggesting right atrial pressure of 3 mmHg. IAS/Shunts: No atrial level shunt detected by color flow Doppler.  LEFT VENTRICLE PLAX 2D LVIDd:         5.10 cm   Diastology LVIDs:         3.80 cm   LV e' medial:    7.94 cm/s LV PW:         1.10 cm   LV E/e' medial:  8.6 LV IVS:        1.40 cm   LV e' lateral:   7.07 cm/s LVOT diam:     2.30 cm   LV E/e' lateral: 9.6 LV SV:         100 LV SV Index:   46 LVOT Area:     4.15 cm  RIGHT VENTRICLE RV S prime:     19.10 cm/s TAPSE (M-mode): 2.9 cm LEFT ATRIUM             Index        RIGHT ATRIUM           Index LA diam:        3.40 cm 1.57 cm/m   RA Area:     16.10 cm LA Vol (A2C):   54.0 ml 24.89 ml/m  RA Volume:   42.40 ml  19.54 ml/m LA Vol (A4C):   56.6 ml 26.09 ml/m LA Biplane Vol: 59.3 ml 27.33 ml/m  AORTIC VALVE AV Area (Vmax):    3.75 cm AV Area (Vmean):   3.62 cm AV Area (VTI):     3.68 cm AV Vmax:           134.00 cm/s AV Vmean:          92.700 cm/s AV VTI:             0.271 m AV Peak Grad:      7.2 mmHg AV Mean Grad:      4.0 mmHg LVOT Vmax:         121.00 cm/s LVOT Vmean:        80.700 cm/s LVOT VTI:          0.240 m LVOT/AV VTI ratio: 0.89  AORTA Ao Root diam: 4.70 cm Ao Asc diam:  4.80 cm MITRAL VALVE                TRICUSPID VALVE MV Area (PHT): 4.36 cm     TR Peak grad:   39.7 mmHg MV Decel Time: 174 msec     TR Vmax:        315.00 cm/s MV E velocity: 68.00 cm/s MV A velocity: 145.00 cm/s  SHUNTS MV E/A ratio:  0.47         Systemic VTI:  0.24 m                             Systemic Diam: 2.30 cm Charlton Haws MD Electronically signed by Theron Arista  Eden Emms MD Signature Date/Time: 01/23/2023/1:35:14 PM    Final    US RENAL  Result Date: 01/22/2023 CLINICAL DATA:  Acute kidney injury EXAM: RENAL / URINARY TRACT ULTRASOUND COMPLETE COMPARISON:  CT abdomen pelvis 12/06/2022 FINDINGS: Right Kidney: Renal measurements: 11.4 x 4.9 x 4.7 cm = volume: 136 mL. Mildly thinned, echogenic renal cortex. No hydronephrosis. Left Kidney: Renal measurements: 10.8 x 6.1 x 5.5 cm = volume: 190 mL. Mildly thinned, echogenic renal cortex. No hydronephrosis. Bladder: Appears normal for degree of bladder distention. Other: None. IMPRESSION: Thinned echogenic renal cortices consistent with chronic medical renal disease. Electronically Signed   By: Acquanetta Belling M.D.   On: 01/22/2023 10:27   DG CHEST PORT 1 VIEW  Result Date: 01/22/2023 CLINICAL DATA:  Shortness of breath EXAM: PORTABLE CHEST 1 VIEW COMPARISON:  10/24/2010 and overlapping portions CT abdomen 12/06/2022 FINDINGS: Upper normal heart size. Basilar and infrahilar patchy densities suspicious for acute pulmonary edema given the elevated BNP. Bibasilar pneumonia is a less likely differential diagnostic consideration. Mildly prominent right hilum, probably vascular, less likely from adenopathy. 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 Lower thoracic spondylosis. Costophrenic angles partially excluded although no obvious  blunting of the visualized portion observed. IMPRESSION: 1. Patchy basilar and infrahilar densities suspicious for acute pulmonary edema given the elevated BNP. Bibasilar pneumonia is a less likely differential diagnostic consideration. 2. Mildly prominent right hilum, probably vascular, less likely from adenopathy. 3. 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 Electronically Signed   By: Gaylyn Rong M.D.   On: 01/22/2023 09:33    Labs:  CBC: Recent Labs    12/05/22 1915 01/22/23 0608 01/23/23 0025 01/24/23 0059  WBC 7.8 9.1 7.2 7.7  HGB 10.9* 9.2* 8.8* 9.1*  HCT 34.3* 28.3* 26.8* 27.8*  PLT 417* 209 220 229    COAGS: Recent Labs    01/22/23 1012 01/22/23 2030 01/23/23 0025 01/23/23 0601 01/23/23 1403 01/24/23 0059  INR 1.3*  --   --   --   --   --   APTT 38*   < > 64* 67* 58* 79*   < > = values in this interval not displayed.    BMP: Recent Labs    12/05/22 1915 01/22/23 0606 01/23/23 0025 01/24/23 0059  NA 138 140 140 140  K 5.7* 4.1 4.6 4.5  CL 108 116* 115* 114*  CO2 18* 12* 13* 14*  GLUCOSE 76 45* 121* 125*  BUN 79* 85* 91* 95*  CALCIUM 8.0* 7.3* 7.2* 7.2*  CREATININE 3.79* 6.07* 6.29* 6.58*  GFRNONAA 16* 9* 9* 8*    LIVER FUNCTION TESTS: Recent Labs    01/22/23 0606 01/23/23 0025 01/24/23 0059  BILITOT 0.7  --   --   AST 20  --   --   ALT 21  --   --   ALKPHOS 74  --   --   PROT 6.1*  --   --   ALBUMIN 2.7* 2.6* 2.6*    Assessment and Plan:  72 yo. Male inpatient. History of HTN, HLD, DM, PAF ( on eliquis), CVA, Presented to the [facility] R with [facility] and AMS. Found to be ina cute on chronic kidney disease thought to be due to decompensated CHF. Patient was transferred to [facility] for nephrology. Team is requesting a tunneled dialysis catheter for dialysis access.   Per EPIC no line history. Chest xray from 7.3.24 shows no lines or drains.   BUN 95, Cr 6. NKDA. Patient has been NPO since 10:00  am.  Risks and benefits discussed with  the patient including, but not limited to bleeding, infection, vascular injury, pneumothorax which may require chest tube placement, air embolism or even death  All of the patient's questions were answered, patient is agreeable to proceed. Consent signed and in chart.   Thank you for this interesting consult.  I greatly enjoyed meeting Tonie Zevion Song and look forward to participating in their care.  A copy of this report was sent to the requesting provider on this date.  Electronically Signed: Alene Mires, NP 01/24/2023, 10:03 AM   I spent a total of 40 Minutes   in face to face in clinical consultation, greater than 50% of which was counseling/coordinating care for tunneled HD catheter placement

## 2023-01-24 NOTE — Sedation Documentation (Signed)
One medication only patient NPO at 1000

## 2023-01-24 NOTE — Progress Notes (Addendum)
Physical Therapy Treatment Patient Details Name: [name] MRN: [id number] DOB: Dec 07, 1950 Today's Date: 01/24/2023   History of Present Illness 72 y.o. male presents to [facility] hospital on 01/22/2023 with SOB and AMS, found to be in acute respiratory failure 2/2 pulmonary edema along with acute diastolic congestive heart failure. 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 PMH includes HTN, DMII, PAF, CKD IV.    PT Comments  Pt supine in bed on arrival this session.  He is motivated to mobilize.  Continues to show deficits on L side which cause a gt abnormality.  He continues to improves.  Required cueing for pacing to slow movements.  Plan remains for return home with f/u OPPT.        Assistance Recommended at Discharge Set up Supervision/Assistance  If plan is discharge home, recommend the following:  Can travel by private vehicle    Help with stairs or ramp for entrance;Assist for transportation;A little help with walking and/or transfers      Equipment Recommendations  None recommended by PT    Recommendations for Other Services OT consult     Precautions / Restrictions Precautions Precautions: Fall Restrictions Weight Bearing Restrictions: No     Mobility  Bed Mobility Overal bed mobility: Modified Independent             General bed mobility comments: Up in the recliner    Transfers Overall transfer level: Needs assistance Equipment used: Rolling walker (2 wheels) Transfers: Sit to/from Stand, Bed to chair/wheelchair/BSC Sit to Stand: Supervision           General transfer comment: Cues for hand placement to and from seated surface this session.  Pt attempting to sit before backed entirely seated surface.    Ambulation/Gait Ambulation/Gait assistance: Min guard Gait Distance (Feet): 150 Feet Assistive device: Rolling walker (2 wheels) Gait Pattern/deviations: Step-to pattern, Step-through  pattern, Decreased step length - right, Decreased stride length, Decreased weight shift to left, Decreased stance time - right, Trunk flexed Gait velocity: Decreased     General Gait Details: Cues for posture and keeping equal strides with step through pattern.   Stairs             Wheelchair Mobility     Tilt Bed    Modified Rankin (Stroke Patients Only)       Balance Overall balance assessment: Mild deficits observed, not formally tested Sitting-balance support: Feet supported, Single extremity supported Sitting balance-Leahy Scale: Fair       Standing balance-Leahy Scale: Fair                              Cognition Arousal/Alertness: Awake/alert Behavior During Therapy: WFL for tasks assessed/performed Overall Cognitive Status: Within Functional Limits for tasks assessed                                 General Comments: Patient A&Ox4; able to follow one step commands consistently throughout session.        Exercises General Exercises - Lower Extremity Long Arc Quad: AROM, Both, 10 reps, Seated Hip ABduction/ADduction: AROM, Both, 10 reps, Supine Straight Leg Raises: AROM, Both, 10 reps, Supine Hip Flexion/Marching: AROM, Both, 10 reps, Seated    General Comments        Pertinent Vitals/Pain Pain Assessment Pain Assessment: No/denies pain    Home  Living                          Prior Function            PT Goals (current goals can now be found in the care plan section) Acute Rehab PT Goals Patient Stated Goal: Patient would like to return home to family. Potential to Achieve Goals: Good Progress towards PT goals: Progressing toward goals    Frequency    Min 1X/week      PT Plan Current plan remains appropriate    Co-evaluation              AM-PAC PT "6 Clicks" Mobility   Outcome Measure  Help needed turning from your back to your side while in a flat bed without using bedrails?:  None Help needed moving from lying on your back to sitting on the side of a flat bed without using bedrails?: None Help needed moving to and from a bed to a chair (including a wheelchair)?: None Help needed standing up from a chair using your arms (e.g., wheelchair or bedside chair)?: None Help needed to walk in hospital room?: A Little Help needed climbing 3-5 steps with a railing? : A Little 6 Click Score: 22    End of Session Equipment Utilized During Treatment: Gait belt Activity Tolerance: Patient tolerated treatment well Patient left: in bed;with call bell/phone within reach;with nursing/sitter in room Nurse Communication: Mobility status PT Visit Diagnosis: Muscle weakness (generalized) (M62.81);Difficulty in walking, not elsewhere classified (R26.2)     Time: 1478-2956 PT Time Calculation (min) (ACUTE ONLY): 18 min  Charges:    $Gait Training: 8-22 mins PT General Charges $$ ACUTE PT VISIT: 1 Visit                     Bonney Leitz , PTA Acute Rehabilitation Services Office [phone]    Florestine Avers 01/24/2023, 12:41 PM

## 2023-01-24 NOTE — Progress Notes (Signed)
Heart Failure Navigator Progress Note  Assessed for Heart & Vascular TOC clinic readiness.  Patient does not meet criteria due to Advanced renal disease, VVS consulted for fistula placement this admission. No HF TOC   Navigator will sign off at this time.    Rhae Hammock, BSN, Scientist, clinical (histocompatibility and immunogenetics) Only

## 2023-01-24 NOTE — Procedures (Signed)
Vascular and Interventional Radiology Procedure Note  Patient: [name] DOB: 1951/07/12 Medical Record Number: [id number] Note Date/Time: 01/24/23 2:21 PM   Performing Physician: Roanna Banning, MD Assistant(s): None  Diagnosis: ESRD requiring Hemodialysis  Procedure: TUNNELED HEMODIALYSIS CATHETER PLACEMENT  Anesthesia: Conscious Sedation Complications: None Estimated Blood Loss: Minimal Specimens:  None  Findings:  Successful placement of right-sided, 19 cm (tip-to-cuff), tunneled hemodialysis catheter with the tip of the catheter in the proximal right atrium.  Plan: Catheter ready for use.  See detailed procedure note with images in PACS. The patient tolerated the procedure well without incident or complication and was returned to Floor Bed in stable condition.    Roanna Banning, MD [facility] Rad[facility]   Pager. [phone] Clinic. [phone]

## 2023-01-24 NOTE — Progress Notes (Signed)
Requested to see pt for out-pt HD needs at d/c. Will see pt on Monday.   Olivia Canter Renal Navigator [phone]

## 2023-01-24 NOTE — Progress Notes (Signed)
ANTICOAGULATION CONSULT NOTE  Pharmacy Consult for heparin Indication: atrial fibrillation (CVA 6/27)  No Known Allergies  Patient Measurements: Weight: 100.2 kg (220 lb 14.4 oz) Heparin Dosing Weight: 95.7 kg  Vital Signs: Temp: 97.5 F (36.4 C) (07/05 0514) Temp Source: Oral (07/05 0514) BP: 175/87 (07/05 0514) Pulse Rate: 70 (07/05 0514)  Labs: Recent Labs     0000 01/22/23 0606 01/22/23 1610 01/22/23 0820 01/22/23 1012 01/22/23 2030 01/23/23 0025 01/23/23 0601 01/23/23 1403 01/24/23 0059  HGB   < >  --  9.2*  --   --   --  8.8*  --   --  9.1*  HCT  --   --  28.3*  --   --   --  26.8*  --   --  27.8*  PLT  --   --  209  --   --   --  220  --   --  229  APTT  --   --   --   --  38*   < > 64* 67* 58* 79*  LABPROT  --   --   --   --  16.8*  --   --   --   --   --   INR  --   --   --   --  1.3*  --   --   --   --   --   HEPARINUNFRC  --   --   --   --  >1.10*  --  0.97*  --   --  0.71*  CREATININE  --  6.07*  --   --   --   --  6.29*  --   --  6.58*  TROPONINIHS  --   --  35* 40*  --   --   --   --   --   --    < > = values in this interval not displayed.     Estimated Creatinine Clearance: 11.9 mL/min (A) (by C-G formula based on SCr of 6.58 mg/dL (H)).   Assessment: Patient is a 72 year old male presenting with SOB. 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 Likely a new start HD patient. Will need AVF/AVG this admission. Vascular consulted.  Apixaban on hold and transitioned to heparin. Last dose of apixaban was 7/2 @ 1900.  aPTT this morning is therapeutic at 79 seconds, heparin level elevated from recent DOAC and not yet correlating with the aPTT. No issues with bleeding or infusion reported. Hgb and platelets stable  Goal of Therapy:  Heparin level 0.3-0.5 units/ml aPTT 66-85 seconds Monitor platelets by anticoagulation protocol: Yes   Plan:  Continue heparin at 1550 un/hr Check aPTT/anti-Xa level daily while on  heparin Continue to monitor H&H and platelets   Thank you for allowing pharmacy to be a part of this patient's care.   Signe Colt, PharmD 01/24/2023 8:01 AM  **Pharmacist phone directory can be found on [email or web address] listed under [facility]**

## 2023-01-24 NOTE — Progress Notes (Signed)
PROGRESS NOTE    [name]  MWU:[id number] DOB: 18-Oct-1950 DOA: 01/22/2023 PCP: System, Provider Not In   Brief Narrative:  72 year old with history of past medical history of HTN, DM2, paroxysmal A-fib on Eliquis, CKD stage IV, recent CVA admitted to [facility] on Friday and diagnosed with CVA with residual left upper and lower extremity weakness. Eventually patient went home and then suddenly developed shortness of breath with change in mental status at home. Patient was found to have elevated BNP, creatinine of 5.96.  Hospitalist called for admission, nephrology team called in consult.  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 EF 55% Hypertensive emergency -Blood pressure more appropriately controlled  -Able to wean off BiPAP soon after admission, currently on room air over the past 24 hours -Continue IV Lasix, metoprolol, amlodipine -EKG without profound ST changes -Troponin minimally elevated not consistent with ACS   Acute on chronic kidney disease, stage IV -likely approaching ESRD Metabolic acidosis -Likely prerenal in the setting of hypertensive urgency complicated by chronic uncontrolled hypertension and diabetes -Continue to hold home ACE inhibitor -Baseline creatinine around 2.5, currently 6.5 and continues to climb -Vascular surgery following, nephrology following.  Plan for temporary hemodialysis catheter placement today.  Scheduling of dialysis per nephrology. -Patient will need to be clipped prior to discharge   Hypertensive emergency -In the setting of AKI, respiratory distress  and hypoxia -complicating his chronic uncontrolled hypertension due to noncompliance -Avoid tight blood pressure control to ensure patient is able to tolerate dialysis/avoid hypotension -Blood pressure improving on amlodipine 10, IV Lasix 80 twice daily, metoprolol succinate 25 -Hoping initiation of dialysis will also improve patient's volume status and blood pressure   History of paroxysmal atrial fibrillation -Continue heparin drip, patient on Eliquis at home, on hold given above   Non-insulin-dependent diabetes type 2, well-controlled -A1c 5.6, continue sliding scale insulin and hypoglycemic protocol while in hospital -Resume glipizide and diabetic diet at discharge   Hypothyroidism -Continue home levothyroxine 75   Recent CVA/ left sided weakness -Start low-dose aspirin and statin  Home med rec does not include aspirin likely in the setting of Eliquis.   DVT prophylaxis: Heparin drip Code Status: Full Family Communication: Daughter at bedside, lengthy discussion with daughter and patient about current prognosis, complications as well as his medical history as there appears to be a wide gap in knowledge amongst different family members.  Status is: Inpatient  Dispo: The patient is from: Home              Anticipated d/c is to: Home              Anticipated d/c date is: 24 to 48 hours              Patient currently not medically stable for discharge  Consultants:  Vascular surgery, nephrology  Procedures:  None  Antimicrobials:  None indicated  Subjective: No acute issues or events overnight, continues to request discharge which we have to explain it is not warranted at this time due to above.  He otherwise denies nausea vomiting diarrhea constipation any fevers chills or chest pain.  Objective: Vitals:   01/23/23 1629 01/23/23 1944 01/24/23 0054 01/24/23 0514  BP: (!) 169/89 (!) 164/88 (!) 170/81 (!) 175/87  Pulse:  77 77 70  Resp:  18 18   Temp:  98 F (36.7 C)  97.7 F (36.5 C) (!) 97.5 F (36.4 C)  TempSrc:  Oral Oral Oral  SpO2:  98% 97% 97%  Weight:    100.2 kg    Intake/Output Summary (Last 24 hours) at 01/24/2023 0723 Last data filed at 01/24/2023 0519 Gross per 24 hour  Intake 401.91 ml  Output 1725 ml  Net -1323.09 ml    Filed Weights   01/22/23 0646 01/23/23 0009 01/24/23 0514  Weight: 99.3 kg 97 kg 100.2 kg    Examination:  General:  Pleasantly resting in bed, No acute distress. HEENT:  Normocephalic atraumatic.  Sclerae nonicteric, noninjected.  Extraocular movements intact bilaterally. Neck:  Without mass or deformity.  Trachea is midline. Lungs:  Clear to auscultate bilaterally without rhonchi, wheeze, or rales. Heart:  Regular rate and rhythm.  Without murmurs, rubs, or gallops. Abdomen:  Soft, nontender, nondistended.  Without guarding or rebound. Extremities: Without cyanosis, clubbing, edema, or obvious deformity. Skin:  Warm and dry, no erythema.  Data Reviewed: I have personally reviewed following labs and imaging studies  CBC: Recent Labs  Lab 01/22/23 0608 01/23/23 0025 01/24/23 0059  WBC 9.1 7.2 7.7  NEUTROABS 7.0  --   --   HGB 9.2* 8.8* 9.1*  HCT 28.3* 26.8* 27.8*  MCV 93.1 95.0 92.7  PLT 209 220 229    Basic Metabolic Panel: Recent Labs  Lab 01/22/23 0606 01/23/23 0025 01/24/23 0059  NA 140 140 140  K 4.1 4.6 4.5  CL 116* 115* 114*  CO2 12* 13* 14*  GLUCOSE 45* 121* 125*  BUN 85* 91* 95*  CREATININE 6.07* 6.29* 6.58*  CALCIUM 7.3* 7.2* 7.2*  MG 1.8 1.8 1.8  PHOS  --  6.3* 5.7*    GFR: Estimated Creatinine Clearance: 11.9 mL/min (A) (by C-G formula based on SCr of 6.58 mg/dL (H)). Liver Function Tests: Recent Labs  Lab 01/22/23 0606 01/23/23 0025 01/24/23 0059  AST 20  --   --   ALT 21  --   --   ALKPHOS 74  --   --   BILITOT 0.7  --   --   PROT 6.1*  --   --   ALBUMIN 2.7* 2.6* 2.6*    No results for input(s): "LIPASE", "AMYLASE" in the last 168 hours. No results for  input(s): "AMMONIA" in the last 168 hours. Coagulation Profile: Recent Labs  Lab 01/22/23 1012  INR 1.3*    Cardiac Enzymes: No results for input(s): "CKTOTAL", "CKMB", "CKMBINDEX", "TROPONINI" in the last 168 hours. BNP (last 3 results) No results for input(s): "PROBNP" in the last 8760 hours. HbA1C: Recent Labs    01/22/23 0606  HGBA1C 5.6    CBG: Recent Labs  Lab 01/23/23 1554 01/23/23 2023 01/23/23 2057 01/24/23 0517 01/24/23 0623  GLUCAP 201* 121* 120* 110* 104*    Lipid Profile: No results for input(s): "CHOL", "HDL", "LDLCALC", "TRIG", "CHOLHDL", "LDLDIRECT" in the last 72 hours. Thyroid Function Tests: No results for input(s): "TSH", "T4TOTAL", "FREET4", "T3FREE", "THYROIDAB" in the last 72 hours. Anemia Panel: Recent Labs    01/22/23 1359 01/23/23 0025  FOLATE  --  7.8  TIBC 216*  --   IRON 44*  --     Sepsis Labs: Recent Labs  Lab 01/22/23 0606 01/22/23  0820  LATICACIDVEN 0.7 0.7     No results found for this or any previous visit (from the past 240 hour(s)).       Radiology Studies: VAS [place] UPPER EXT VEIN MAPPING (PRE-OP AVF)  Result Date: 01/23/2023 UPPER EXTREMITY VEIN MAPPING Patient Name:  [name]  Date of Exam:   01/23/2023 Medical Rec #: [id number]              Accession #:    [id number] Date of Birth: 1950/09/17              Patient Gender: M Patient Age:   75 years Exam Location:  [facility] Procedure:      VAS [place] UPPER EXT VEIN MAPPING (PRE-OP AVF) Referring Phys: Lemar Livings --------------------------------------------------------------------------------  Indications: Pre-access. History: CKD 5.  Limitations: Bandaging, caliber of vessels Comparison Study: No prior studies. Performing Technologist: Jean Rosenthal RDMS, RVT  Examination Guidelines: A complete evaluation includes B-mode imaging, spectral Doppler, color Doppler, and power Doppler as needed of all accessible portions of each vessel. Bilateral testing is  considered an integral part of a complete examination. Limited examinations for reoccurring indications may be performed as noted. +-----------------+-------------+----------+--------------+ Right Cephalic   Diameter (cm)Depth (cm)   Findings    +-----------------+-------------+----------+--------------+ Shoulder             0.10        0.63                  +-----------------+-------------+----------+--------------+ Prox upper arm       0.12        0.40                  +-----------------+-------------+----------+--------------+ Mid upper arm        0.08        0.19                  +-----------------+-------------+----------+--------------+ Dist upper arm                          not visualized +-----------------+-------------+----------+--------------+ Antecubital fossa                         Bandaging    +-----------------+-------------+----------+--------------+ Prox forearm         0.36        0.38                  +-----------------+-------------+----------+--------------+ Mid forearm          0.31        0.17                  +-----------------+-------------+----------+--------------+ Dist forearm         0.30        0.23                  +-----------------+-------------+----------+--------------+ Wrist                0.20        0.59                  +-----------------+-------------+----------+--------------+ +-----------------+-------------+----------+---------+ Right Basilic    Diameter (cm)Depth (cm)Findings  +-----------------+-------------+----------+---------+ Mid upper arm        0.46                         +-----------------+-------------+----------+---------+ Dist upper arm  0.44                         +-----------------+-------------+----------+---------+ Antecubital fossa    0.34               branching +-----------------+-------------+----------+---------+ Prox forearm         0.36                          +-----------------+-------------+----------+---------+ Mid forearm          0.17                         +-----------------+-------------+----------+---------+ Distal forearm       0.19                         +-----------------+-------------+----------+---------+ Wrist                0.15                         +-----------------+-------------+----------+---------+ +-----------------+-------------+----------+--------------+ Left Cephalic    Diameter (cm)Depth (cm)   Findings    +-----------------+-------------+----------+--------------+ Shoulder                                not visualized +-----------------+-------------+----------+--------------+ Prox upper arm                          not visualized +-----------------+-------------+----------+--------------+ Mid upper arm                           not visualized +-----------------+-------------+----------+--------------+ Dist upper arm                          not visualized +-----------------+-------------+----------+--------------+ Antecubital fossa                       not visualized +-----------------+-------------+----------+--------------+ Prox forearm         0.20        0.32                  +-----------------+-------------+----------+--------------+ Mid forearm          0.14        0.54                  +-----------------+-------------+----------+--------------+ Dist forearm         0.14        0.40                  +-----------------+-------------+----------+--------------+ Wrist                0.12        0.36                  +-----------------+-------------+----------+--------------+ +-----------------+-------------+----------+---------+ Left Basilic     Diameter (cm)Depth (cm)Findings  +-----------------+-------------+----------+---------+ Prox upper arm       0.58                         +-----------------+-------------+----------+---------+ Mid upper arm        0.29                          +-----------------+-------------+----------+---------+  Dist upper arm       0.36                         +-----------------+-------------+----------+---------+ Antecubital fossa    0.24                         +-----------------+-------------+----------+---------+ Prox forearm         0.18               branching +-----------------+-------------+----------+---------+ Mid forearm          0.18                         +-----------------+-------------+----------+---------+ Distal forearm       0.17                         +-----------------+-------------+----------+---------+ Wrist                0.17                         +-----------------+-------------+----------+---------+ *See table(s) above for measurements and observations.   Diagnosing physician:    Preliminary    ECHOCARDIOGRAM COMPLETE  Result Date: 01/23/2023    ECHOCARDIOGRAM REPORT   Patient Name:   [name] Date of Exam: 01/23/2023 Medical Rec #:  [id number]             Height:       71.0 in Accession #:    [id number]            Weight:       213.8 lb Date of Birth:  12/21/1950             BSA:          2.169 m Patient Age:    72 years              BP:           171/84 mmHg Patient Gender: M                     HR:           84 bpm. Exam Location:  Inpatient Procedure: 2D Echo, Color Doppler and Cardiac Doppler Indications:    CHF  History:        Patient has no prior history of Echocardiogram examinations.                 CHF, PE, CKD and Stroke, Arrythmias:Atrial Fibrillation; Risk                 Factors:Hypertension and Diabetes.  Sonographer:    Milbert Coulter Referring Phys: 53 DEBBY CROSLEY  Sonographer Comments: Image acquisition challenging due to respiratory motion and Image acquisition challenging due to patient body habitus. IMPRESSIONS  1. Left ventricular ejection fraction, by estimation, is 55 to 60%. The left ventricle has normal function. The left ventricle has no  regional wall motion abnormalities. There is moderate left ventricular hypertrophy. Left ventricular diastolic parameters are consistent with Grade I diastolic dysfunction (impaired relaxation).  2. Right ventricular systolic function is normal. The right ventricular size is normal.  3. The mitral valve is normal in structure. No evidence of mitral valve regurgitation. No evidence of mitral stenosis.  4. Degree of AR not well interrogated No suprasternal notch doppler  and poor color flow but does not seen severe Consider f/u imaging for aortic root enlargment with CTA or MRA . The aortic valve is tricuspid. There is mild calcification of the aortic valve. There is mild thickening of the aortic valve. Aortic valve regurgitation is mild. Aortic valve sclerosis is present, with no evidence of aortic valve stenosis.  5. Aortic dilatation noted. There is severe dilatation of the aortic root, measuring 48 mm.  6. The inferior vena cava is normal in size with greater than 50% respiratory variability, suggesting right atrial pressure of 3 mmHg. FINDINGS  Left Ventricle: Left ventricular ejection fraction, by estimation, is 55 to 60%. The left ventricle has normal function. The left ventricle has no regional wall motion abnormalities. The left ventricular internal cavity size was normal in size. There is  moderate left ventricular hypertrophy. Left ventricular diastolic parameters are consistent with Grade I diastolic dysfunction (impaired relaxation). Right Ventricle: The right ventricular size is normal. No increase in right ventricular wall thickness. Right ventricular systolic function is normal. Left Atrium: Left atrial size was normal in size. Right Atrium: Right atrial size was normal in size. Pericardium: There is no evidence of pericardial effusion. Mitral Valve: The mitral valve is normal in structure. No evidence of mitral valve regurgitation. No evidence of mitral valve stenosis. Tricuspid Valve: The tricuspid  valve is normal in structure. Tricuspid valve regurgitation is not demonstrated. No evidence of tricuspid stenosis. Aortic Valve: Degree of AR not well interrogated No suprasternal notch doppler and poor color flow but does not seen severe Consider f/u imaging for aortic root enlargment with CTA or MRA. The aortic valve is tricuspid. There is mild calcification of the  aortic valve. There is mild thickening of the aortic valve. Aortic valve regurgitation is mild. Aortic valve sclerosis is present, with no evidence of aortic valve stenosis. Aortic valve mean gradient measures 4.0 mmHg. Aortic valve peak gradient measures 7.2 mmHg. Aortic valve area, by VTI measures 3.68 cm. Pulmonic Valve: The pulmonic valve was normal in structure. Pulmonic valve regurgitation is not visualized. No evidence of pulmonic stenosis. Aorta: Aortic dilatation noted. There is severe dilatation of the aortic root, measuring 48 mm. Venous: The inferior vena cava is normal in size with greater than 50% respiratory variability, suggesting right atrial pressure of 3 mmHg. IAS/Shunts: No atrial level shunt detected by color flow Doppler.  LEFT VENTRICLE PLAX 2D LVIDd:         5.10 cm   Diastology LVIDs:         3.80 cm   LV e' medial:    7.94 cm/s LV PW:         1.10 cm   LV E/e' medial:  8.6 LV IVS:        1.40 cm   LV e' lateral:   7.07 cm/s LVOT diam:     2.30 cm   LV E/e' lateral: 9.6 LV SV:         100 LV SV Index:   46 LVOT Area:     4.15 cm  RIGHT VENTRICLE RV S prime:     19.10 cm/s TAPSE (M-mode): 2.9 cm LEFT ATRIUM             Index        RIGHT ATRIUM           Index LA diam:        3.40 cm 1.57 cm/m   RA Area:     16.10 cm LA Vol (  A2C):   54.0 ml 24.89 ml/m  RA Volume:   42.40 ml  19.54 ml/m LA Vol (A4C):   56.6 ml 26.09 ml/m LA Biplane Vol: 59.3 ml 27.33 ml/m  AORTIC VALVE AV Area (Vmax):    3.75 cm AV Area (Vmean):   3.62 cm AV Area (VTI):     3.68 cm AV Vmax:           134.00 cm/s AV Vmean:          92.700 cm/s AV VTI:             0.271 m AV Peak Grad:      7.2 mmHg AV Mean Grad:      4.0 mmHg LVOT Vmax:         121.00 cm/s LVOT Vmean:        80.700 cm/s LVOT VTI:          0.240 m LVOT/AV VTI ratio: 0.89  AORTA Ao Root diam: 4.70 cm Ao Asc diam:  4.80 cm MITRAL VALVE                TRICUSPID VALVE MV Area (PHT): 4.36 cm     TR Peak grad:   39.7 mmHg MV Decel Time: 174 msec     TR Vmax:        315.00 cm/s MV E velocity: 68.00 cm/s MV A velocity: 145.00 cm/s  SHUNTS MV E/A ratio:  0.47         Systemic VTI:  0.24 m                             Systemic Diam: 2.30 cm Charlton Haws MD Electronically signed by Charlton Haws MD Signature Date/Time: 01/23/2023/1:35:14 PM    Final    US RENAL  Result Date: 01/22/2023 CLINICAL DATA:  Acute kidney injury EXAM: RENAL / URINARY TRACT ULTRASOUND COMPLETE COMPARISON:  CT abdomen pelvis 12/06/2022 FINDINGS: Right Kidney: Renal measurements: 11.4 x 4.9 x 4.7 cm = volume: 136 mL. Mildly thinned, echogenic renal cortex. No hydronephrosis. Left Kidney: Renal measurements: 10.8 x 6.1 x 5.5 cm = volume: 190 mL. Mildly thinned, echogenic renal cortex. No hydronephrosis. Bladder: Appears normal for degree of bladder distention. Other: None. IMPRESSION: Thinned echogenic renal cortices consistent with chronic medical renal disease. Electronically Signed   By: Acquanetta Belling M.D.   On: 01/22/2023 10:27     Scheduled Meds:  amLODipine  10 mg Oral Daily   feeding supplement  237 mL Oral BID BM   furosemide  80 mg Intravenous Q12H   insulin aspart  0-15 Units Subcutaneous TID WC   insulin aspart  0-5 Units Subcutaneous QHS   metoprolol succinate  25 mg Oral Daily   multivitamin with minerals  1 tablet Oral Daily   Continuous Infusions:  heparin 1,550 Units/hr (01/23/23 2036)     LOS: 2 days   Time spent: 55 min was spent at bedside discussing case with family, as well as reviewing chart  Azucena Fallen, DO [facility]  If 7PM-7AM, please contact  night-coverage [email or web address]  01/24/2023, 7:23 AM

## 2023-01-24 NOTE — Progress Notes (Signed)
  Progress Note    01/24/2023 12:29 PM * No surgery found *  Subjective: No overnight issues, patient heading to interventional radiology for catheter placement.  Vitals:   01/24/23 0821 01/24/23 1202  BP: (!) 172/93 (!) 151/85  Pulse: 78 71  Resp: 20 20  Temp: 98.1 F (36.7 C) 98.1 F (36.7 C)  SpO2: 95% 98%    Physical Exam: Awake alert and oriented Palpable radial artery pulses bilaterally  CBC    Component Value Date/Time   WBC 7.7 01/24/2023 0059   RBC 3.00 (L) 01/24/2023 0059   HGB 9.1 (L) 01/24/2023 0059   HCT 27.8 (L) 01/24/2023 0059   PLT 229 01/24/2023 0059   MCV 92.7 01/24/2023 0059   MCH 30.3 01/24/2023 0059   MCHC 32.7 01/24/2023 0059   RDW 12.9 01/24/2023 0059   LYMPHSABS 1.4 01/22/2023 0608   MONOABS 0.6 01/22/2023 0608   EOSABS 0.1 01/22/2023 0608   BASOSABS 0.1 01/22/2023 0608    BMET    Component Value Date/Time   NA 140 01/24/2023 0059   K 4.5 01/24/2023 0059   CL 114 (H) 01/24/2023 0059   CO2 14 (L) 01/24/2023 0059   GLUCOSE 125 (H) 01/24/2023 0059   BUN 95 (H) 01/24/2023 0059   CREATININE 6.58 (H) 01/24/2023 0059   CALCIUM 7.2 (L) 01/24/2023 0059   CALCIUM (LL) 09/06/2010 2140    5.7 Result repeated and verified. CRITICAL RESULT CALLED TO, READ BACK BY AND VERIFIED WITH: L FRIESEN,RN 1423 09/07/10 D BRADLEY   GFRNONAA 8 (L) 01/24/2023 0059   GFRAA 56 (L) 08/31/2019 1200    INR    Component Value Date/Time   INR 1.3 (H) 01/22/2023 1012     Intake/Output Summary (Last 24 hours) at 01/24/2023 1229 Last data filed at 01/24/2023 1000 Gross per 24 hour  Intake 641.91 ml  Output 2225 ml  Net -1583.09 ml     Assessment:  72 y.o. male is end-stage renal disease in need of permanent access  Plan: Interventional radiology for [facility] placement today  Plan will be for left arm AV fistula versus more likely graft early next week.  Catcher Dehoyos C. Randie Heinz, MD [facility] Office: [phone] Pager:  [phone]  01/24/2023 12:29 PM

## 2023-01-24 NOTE — Progress Notes (Signed)
Patient ID: Tavist Hudnall, male   DOB: [birth year], 72 y.o.   MRN: [id number] S: No complaints. O:BP (!) 172/93 (BP Location: Right Arm)   Pulse 78   Temp 98.1 F (36.7 C) (Oral)   Resp 20   Wt 100.2 kg   SpO2 95%   BMI 30.81 kg/m   Intake/Output Summary (Last 24 hours) at 01/24/2023 0937 Last data filed at 01/24/2023 0519 Gross per 24 hour  Intake 401.91 ml  Output 1725 ml  Net -1323.09 ml   Intake/Output: I/O last 3 completed shifts: In: 793.4 [P.O.:480; I.V.:313.4] Out: 2625 [Urine:2625]  Intake/Output this shift:  No intake/output data recorded. Weight change: 3.2 kg Gen: NAD CVS: RRR Resp:CTA Abd: +BS, soft, NT/ND Ext: no edema  Recent Labs  Lab 01/22/23 0606 01/23/23 0025 01/24/23 0059  NA 140 140 140  K 4.1 4.6 4.5  CL 116* 115* 114*  CO2 12* 13* 14*  GLUCOSE 45* 121* 125*  BUN 85* 91* 95*  CREATININE 6.07* 6.29* 6.58*  ALBUMIN 2.7* 2.6* 2.6*  CALCIUM 7.3* 7.2* 7.2*  PHOS  --  6.3* 5.7*  AST 20  --   --   ALT 21  --   --    Liver Function Tests: Recent Labs  Lab 01/22/23 0606 01/23/23 0025 01/24/23 0059  AST 20  --   --   ALT 21  --   --   ALKPHOS 74  --   --   BILITOT 0.7  --   --   PROT 6.1*  --   --   ALBUMIN 2.7* 2.6* 2.6*   No results for input(s): "LIPASE", "AMYLASE" in the last 168 hours. No results for input(s): "AMMONIA" in the last 168 hours. CBC: Recent Labs  Lab 01/22/23 0608 01/23/23 0025 01/24/23 0059  WBC 9.1 7.2 7.7  NEUTROABS 7.0  --   --   HGB 9.2* 8.8* 9.1*  HCT 28.3* 26.8* 27.8*  MCV 93.1 95.0 92.7  PLT 209 220 229   Cardiac Enzymes: No results for input(s): "CKTOTAL", "CKMB", "CKMBINDEX", "TROPONINI" in the last 168 hours. CBG: Recent Labs  Lab 01/23/23 1554 01/23/23 2023 01/23/23 2057 01/24/23 0517 01/24/23 0623  GLUCAP 201* 121* 120* 110* 104*    Iron Studies:  Recent Labs    01/22/23 1359  IRON 44*  TIBC 216*   Studies/Results: VAS [place] UPPER EXT VEIN MAPPING (PRE-OP AVF)  Result Date:  01/23/2023 UPPER EXTREMITY VEIN MAPPING Patient Name:  LAYN PLETT  Date of Exam:   01/23/2023 Medical Rec #: [id number]              Accession #:    [id number] Date of Birth: 28-Dec-1950              Patient Gender: M Patient Age:   64 years Exam Location:  [facility] Procedure:      VAS [place] UPPER EXT VEIN MAPPING (PRE-OP AVF) Referring Phys: Lemar Livings --------------------------------------------------------------------------------  Indications: Pre-access. History: CKD 5.  Limitations: Bandaging, caliber of vessels Comparison Study: No prior studies. Performing Technologist: Jean Rosenthal RDMS, RVT  Examination Guidelines: A complete evaluation includes B-mode imaging, spectral Doppler, color Doppler, and power Doppler as needed of all accessible portions of each vessel. Bilateral testing is considered an integral part of a complete examination. Limited examinations for reoccurring indications may be performed as noted. +-----------------+-------------+----------+--------------+ Right Cephalic   Diameter (cm)Depth (cm)   Findings    +-----------------+-------------+----------+--------------+ Shoulder  0.10        0.63                  +-----------------+-------------+----------+--------------+ Prox upper arm       0.12        0.40                  +-----------------+-------------+----------+--------------+ Mid upper arm        0.08        0.19                  +-----------------+-------------+----------+--------------+ Dist upper arm                          not visualized +-----------------+-------------+----------+--------------+ Antecubital fossa                         Bandaging    +-----------------+-------------+----------+--------------+ Prox forearm         0.36        0.38                  +-----------------+-------------+----------+--------------+ Mid forearm          0.31        0.17                   +-----------------+-------------+----------+--------------+ Dist forearm         0.30        0.23                  +-----------------+-------------+----------+--------------+ Wrist                0.20        0.59                  +-----------------+-------------+----------+--------------+ +-----------------+-------------+----------+---------+ Right Basilic    Diameter (cm)Depth (cm)Findings  +-----------------+-------------+----------+---------+ Mid upper arm        0.46                         +-----------------+-------------+----------+---------+ Dist upper arm       0.44                         +-----------------+-------------+----------+---------+ Antecubital fossa    0.34               branching +-----------------+-------------+----------+---------+ Prox forearm         0.36                         +-----------------+-------------+----------+---------+ Mid forearm          0.17                         +-----------------+-------------+----------+---------+ Distal forearm       0.19                         +-----------------+-------------+----------+---------+ Wrist                0.15                         +-----------------+-------------+----------+---------+ +-----------------+-------------+----------+--------------+ Left Cephalic    Diameter (cm)Depth (cm)   Findings    +-----------------+-------------+----------+--------------+ Shoulder  not visualized +-----------------+-------------+----------+--------------+ Prox upper arm                          not visualized +-----------------+-------------+----------+--------------+ Mid upper arm                           not visualized +-----------------+-------------+----------+--------------+ Dist upper arm                          not visualized +-----------------+-------------+----------+--------------+ Antecubital fossa                       not  visualized +-----------------+-------------+----------+--------------+ Prox forearm         0.20        0.32                  +-----------------+-------------+----------+--------------+ Mid forearm          0.14        0.54                  +-----------------+-------------+----------+--------------+ Dist forearm         0.14        0.40                  +-----------------+-------------+----------+--------------+ Wrist                0.12        0.36                  +-----------------+-------------+----------+--------------+ +-----------------+-------------+----------+---------+ Left Basilic     Diameter (cm)Depth (cm)Findings  +-----------------+-------------+----------+---------+ Prox upper arm       0.58                         +-----------------+-------------+----------+---------+ Mid upper arm        0.29                         +-----------------+-------------+----------+---------+ Dist upper arm       0.36                         +-----------------+-------------+----------+---------+ Antecubital fossa    0.24                         +-----------------+-------------+----------+---------+ Prox forearm         0.18               branching +-----------------+-------------+----------+---------+ Mid forearm          0.18                         +-----------------+-------------+----------+---------+ Distal forearm       0.17                         +-----------------+-------------+----------+---------+ Wrist                0.17                         +-----------------+-------------+----------+---------+ *See table(s) above for measurements and observations.   Diagnosing physician:    Preliminary    ECHOCARDIOGRAM COMPLETE  Result Date: 01/23/2023    ECHOCARDIOGRAM REPORT  Patient Name:   SHREE KERNER Date of Exam: 01/23/2023 Medical Rec #:  [id number]             Height:       71.0 in Accession #:    [id number]            Weight:        213.8 lb Date of Birth:  [birth year]             BSA:          2.169 m Patient Age:    72 years              BP:           171/84 mmHg Patient Gender: M                     HR:           84 bpm. Exam Location:  Inpatient Procedure: 2D Echo, Color Doppler and Cardiac Doppler Indications:    CHF  History:        Patient has no prior history of Echocardiogram examinations.                 CHF, PE, CKD and Stroke, Arrythmias:Atrial Fibrillation; Risk                 Factors:Hypertension and Diabetes.  Sonographer:    Milbert Coulter Referring Phys: 33 DEBBY CROSLEY  Sonographer Comments: Image acquisition challenging due to respiratory motion and Image acquisition challenging due to patient body habitus. IMPRESSIONS  1. Left ventricular ejection fraction, by estimation, is 55 to 60%. The left ventricle has normal function. The left ventricle has no regional wall motion abnormalities. There is moderate left ventricular hypertrophy. Left ventricular diastolic parameters are consistent with Grade I diastolic dysfunction (impaired relaxation).  2. Right ventricular systolic function is normal. The right ventricular size is normal.  3. The mitral valve is normal in structure. No evidence of mitral valve regurgitation. No evidence of mitral stenosis.  4. Degree of AR not well interrogated No suprasternal notch doppler and poor color flow but does not seen severe Consider f/u imaging for aortic root enlargment with CTA or MRA . The aortic valve is tricuspid. There is mild calcification of the aortic valve. There is mild thickening of the aortic valve. Aortic valve regurgitation is mild. Aortic valve sclerosis is present, with no evidence of aortic valve stenosis.  5. Aortic dilatation noted. There is severe dilatation of the aortic root, measuring 48 mm.  6. The inferior vena cava is normal in size with greater than 50% respiratory variability, suggesting right atrial pressure of 3 mmHg. FINDINGS  Left Ventricle: Left  ventricular ejection fraction, by estimation, is 55 to 60%. The left ventricle has normal function. The left ventricle has no regional wall motion abnormalities. The left ventricular internal cavity size was normal in size. There is  moderate left ventricular hypertrophy. Left ventricular diastolic parameters are consistent with Grade I diastolic dysfunction (impaired relaxation). Right Ventricle: The right ventricular size is normal. No increase in right ventricular wall thickness. Right ventricular systolic function is normal. Left Atrium: Left atrial size was normal in size. Right Atrium: Right atrial size was normal in size. Pericardium: There is no evidence of pericardial effusion. Mitral Valve: The mitral valve is normal in structure. No evidence of mitral valve regurgitation. No evidence of mitral valve stenosis. Tricuspid Valve: The tricuspid valve is normal in structure. Tricuspid valve  regurgitation is not demonstrated. No evidence of tricuspid stenosis. Aortic Valve: Degree of AR not well interrogated No suprasternal notch doppler and poor color flow but does not seen severe Consider f/u imaging for aortic root enlargment with CTA or MRA. The aortic valve is tricuspid. There is mild calcification of the  aortic valve. There is mild thickening of the aortic valve. Aortic valve regurgitation is mild. Aortic valve sclerosis is present, with no evidence of aortic valve stenosis. Aortic valve mean gradient measures 4.0 mmHg. Aortic valve peak gradient measures 7.2 mmHg. Aortic valve area, by VTI measures 3.68 cm. Pulmonic Valve: The pulmonic valve was normal in structure. Pulmonic valve regurgitation is not visualized. No evidence of pulmonic stenosis. Aorta: Aortic dilatation noted. There is severe dilatation of the aortic root, measuring 48 mm. Venous: The inferior vena cava is normal in size with greater than 50% respiratory variability, suggesting right atrial pressure of 3 mmHg. IAS/Shunts: No atrial  level shunt detected by color flow Doppler.  LEFT VENTRICLE PLAX 2D LVIDd:         5.10 cm   Diastology LVIDs:         3.80 cm   LV e' medial:    7.94 cm/s LV PW:         1.10 cm   LV E/e' medial:  8.6 LV IVS:        1.40 cm   LV e' lateral:   7.07 cm/s LVOT diam:     2.30 cm   LV E/e' lateral: 9.6 LV SV:         100 LV SV Index:   46 LVOT Area:     4.15 cm  RIGHT VENTRICLE RV S prime:     19.10 cm/s TAPSE (M-mode): 2.9 cm LEFT ATRIUM             Index        RIGHT ATRIUM           Index LA diam:        3.40 cm 1.57 cm/m   RA Area:     16.10 cm LA Vol (A2C):   54.0 ml 24.89 ml/m  RA Volume:   42.40 ml  19.54 ml/m LA Vol (A4C):   56.6 ml 26.09 ml/m LA Biplane Vol: 59.3 ml 27.33 ml/m  AORTIC VALVE AV Area (Vmax):    3.75 cm AV Area (Vmean):   3.62 cm AV Area (VTI):     3.68 cm AV Vmax:           134.00 cm/s AV Vmean:          92.700 cm/s AV VTI:            0.271 m AV Peak Grad:      7.2 mmHg AV Mean Grad:      4.0 mmHg LVOT Vmax:         121.00 cm/s LVOT Vmean:        80.700 cm/s LVOT VTI:          0.240 m LVOT/AV VTI ratio: 0.89  AORTA Ao Root diam: 4.70 cm Ao Asc diam:  4.80 cm MITRAL VALVE                TRICUSPID VALVE MV Area (PHT): 4.36 cm     TR Peak grad:   39.7 mmHg MV Decel Time: 174 msec     TR Vmax:        315.00 cm/s MV E velocity: 68.00 cm/s MV  A velocity: 145.00 cm/s  SHUNTS MV E/A ratio:  0.47         Systemic VTI:  0.24 m                             Systemic Diam: 2.30 cm Charlton Haws MD Electronically signed by Charlton Haws MD Signature Date/Time: 01/23/2023/1:35:14 PM    Final    US RENAL  Result Date: 01/22/2023 CLINICAL DATA:  Acute kidney injury EXAM: RENAL / URINARY TRACT ULTRASOUND COMPLETE COMPARISON:  CT abdomen pelvis 12/06/2022 FINDINGS: Right Kidney: Renal measurements: 11.4 x 4.9 x 4.7 cm = volume: 136 mL. Mildly thinned, echogenic renal cortex. No hydronephrosis. Left Kidney: Renal measurements: 10.8 x 6.1 x 5.5 cm = volume: 190 mL. Mildly thinned, echogenic renal cortex. No  hydronephrosis. Bladder: Appears normal for degree of bladder distention. Other: None. IMPRESSION: Thinned echogenic renal cortices consistent with chronic medical renal disease. Electronically Signed   By: Acquanetta Belling M.D.   On: 01/22/2023 10:27    amLODipine  10 mg Oral Daily   feeding supplement  237 mL Oral BID BM   furosemide  80 mg Intravenous Q12H   insulin aspart  0-15 Units Subcutaneous TID WC   insulin aspart  0-5 Units Subcutaneous QHS   metoprolol succinate  25 mg Oral Daily   multivitamin with minerals  1 tablet Oral Daily    BMET    Component Value Date/Time   NA 140 01/24/2023 0059   K 4.5 01/24/2023 0059   CL 114 (H) 01/24/2023 0059   CO2 14 (L) 01/24/2023 0059   GLUCOSE 125 (H) 01/24/2023 0059   BUN 95 (H) 01/24/2023 0059   CREATININE 6.58 (H) 01/24/2023 0059   CALCIUM 7.2 (L) 01/24/2023 0059   CALCIUM (LL) 09/06/2010 2140    5.7 Result repeated and verified. CRITICAL RESULT CALLED TO, READ BACK BY AND VERIFIED WITH: L FRIESEN,RN 1423 09/07/10 D BRADLEY   GFRNONAA 8 (L) 01/24/2023 0059   GFRAA 56 (L) 08/31/2019 1200   CBC    Component Value Date/Time   WBC 7.7 01/24/2023 0059   RBC 3.00 (L) 01/24/2023 0059   HGB 9.1 (L) 01/24/2023 0059   HCT 27.8 (L) 01/24/2023 0059   PLT 229 01/24/2023 0059   MCV 92.7 01/24/2023 0059   MCH 30.3 01/24/2023 0059   MCHC 32.7 01/24/2023 0059   RDW 12.9 01/24/2023 0059   LYMPHSABS 1.4 01/22/2023 0608   MONOABS 0.6 01/22/2023 0608   EOSABS 0.1 01/22/2023 0608   BASOSABS 0.1 01/22/2023 0608    Assessment/Plan:  AKI/CKD stage IV - presumably due to decompensated CHF vs progressive CKD to stage V.  He has diuresed over the past 2 days, however his BUN/Cr continue to climb.  Given advanced CKD at baseline, and recent CHF episode, will plan to initiate HD during this admission.  Will consult IR for [facility] and consult VVS to evaluate for AVF/AVG placement during this hospitalization.  Avoid nephrotoxic medications including NSAIDs and  iodinated intravenous contrast exposure unless the latter is absolutely indicated.  Preferred narcotic agents for pain control are hydromorphone, fentanyl, and methadone. Morphine should not be used. Avoid Baclofen and avoid oral sodium phosphate and magnesium citrate based laxatives / bowel preps. Continue strict Input and Output monitoring. Will monitor the patient closely with you and intervene or adjust therapy as indicated by changes in clinical status/labs  Acute hypoxic respiratory failure - due to pulmonary edema.  ECHO  pending.  Off of BiPAP.  Continue with diuresis and follow.  Bronchodilators per primary svc. Anemia of CKD stage IV - will check iron stores.  Hold off on ESA for now given acute CHF Acute on chronic CHF - diuresing.  ECHO EF 55-60%, Grade I DD. AGMA - due to #1.  Hold off on sodium bicarbonate until volume improves.  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  Irena Cords, MD [facility]

## 2023-01-25 ENCOUNTER — Other Ambulatory Visit: Payer: Self-pay

## 2023-01-25 LAB — CBC
HCT: 26.2 % — ABNORMAL LOW (ref 39.0–52.0)
Hemoglobin: 8.5 g/dL — ABNORMAL LOW (ref 13.0–17.0)
MCH: 29.9 pg (ref 26.0–34.0)
MCHC: 32.4 g/dL (ref 30.0–36.0)
MCV: 92.3 fL (ref 80.0–100.0)
Platelets: 222 10*3/uL (ref 150–400)
RBC: 2.84 MIL/uL — ABNORMAL LOW (ref 4.22–5.81)
RDW: 12.9 % (ref 11.5–15.5)
WBC: 8.7 10*3/uL (ref 4.0–10.5)
nRBC: 0 % (ref 0.0–0.2)

## 2023-01-25 LAB — HEPARIN LEVEL (UNFRACTIONATED)
Heparin Unfractionated: 0.25 IU/mL — ABNORMAL LOW (ref 0.30–0.70)
Heparin Unfractionated: 0.67 IU/mL (ref 0.30–0.70)

## 2023-01-25 LAB — RENAL FUNCTION PANEL
Albumin: 2.5 g/dL — ABNORMAL LOW (ref 3.5–5.0)
Anion gap: 10 (ref 5–15)
BUN: 98 mg/dL — ABNORMAL HIGH (ref 8–23)
CO2: 16 mmol/L — ABNORMAL LOW (ref 22–32)
Calcium: 7.1 mg/dL — ABNORMAL LOW (ref 8.9–10.3)
Chloride: 113 mmol/L — ABNORMAL HIGH (ref 98–111)
Creatinine, Ser: 7.05 mg/dL — ABNORMAL HIGH (ref 0.61–1.24)
GFR, Estimated: 8 mL/min — ABNORMAL LOW (ref >60)
Glucose, Bld: 156 mg/dL — ABNORMAL HIGH (ref 70–99)
Phosphorus: 5.9 mg/dL — ABNORMAL HIGH (ref 2.5–4.6)
Potassium: 4.5 mmol/L (ref 3.5–5.1)
Sodium: 139 mmol/L (ref 135–145)

## 2023-01-25 LAB — GLUCOSE, CAPILLARY
Glucose-Capillary: 125 mg/dL — ABNORMAL HIGH (ref 70–99)
Glucose-Capillary: 147 mg/dL — ABNORMAL HIGH (ref 70–99)
Glucose-Capillary: 236 mg/dL — ABNORMAL HIGH (ref 70–99)
Glucose-Capillary: 162 mg/dL — ABNORMAL HIGH (ref 70–99)

## 2023-01-25 LAB — MAGNESIUM: Magnesium: 1.7 mg/dL (ref 1.7–2.4)

## 2023-01-25 LAB — HEPATITIS B SURFACE ANTIBODY, QUANTITATIVE: Hep B S AB Quant (Post): <3.5 m[IU]/mL — ABNORMAL LOW

## 2023-01-25 LAB — APTT
aPTT: 38 seconds — ABNORMAL HIGH (ref 24–36)
aPTT: >200 seconds (ref 24–36)

## 2023-01-25 MED ORDER — SODIUM CHLORIDE 0.9 % IV SOLN
200.0000 mg | Freq: Every day | INTRAVENOUS | Status: AC
  Administered 2023-01-25 – 2023-01-26 (×2): 200 mg via INTRAVENOUS
  Filled 2023-01-25 (×2): qty 10

## 2023-01-25 MED ORDER — HEPARIN SODIUM (PORCINE) 1000 UNIT/ML IJ SOLN
INTRAMUSCULAR | Status: AC
  Administered 2023-01-25: 1000 [IU]
  Filled 2023-01-25: qty 4

## 2023-01-25 MED ORDER — DARBEPOETIN ALFA 60 MCG/0.3ML IJ SOSY
60.0000 ug | PREFILLED_SYRINGE | INTRAMUSCULAR | Status: DC
  Administered 2023-01-25 – 2023-02-01 (×2): 60 ug via SUBCUTANEOUS
  Filled 2023-01-25 (×2): qty 0.3

## 2023-01-25 NOTE — Procedures (Signed)
I was present at this dialysis session. I have reviewed the session itself and made appropriate changes.   Vital signs in last 24 hours:  Temp:  [97.3 F (36.3 C)-98.6 F (37 C)] 98.5 F (36.9 C) (07/06 0838) Pulse Rate:  [71-91] 72 (07/06 0858) Resp:  [15-20] 15 (07/06 0858) BP: (148-186)/(70-98) 171/98 (07/06 0858) SpO2:  [95 %-98 %] 97 % (07/06 0858) Weight:  [95.7 kg-96.1 kg] 95.7 kg (07/06 0832) Weight change: -4.1 kg Filed Weights   01/24/23 0514 01/25/23 0427 01/25/23 0832  Weight: 100.2 kg 96.1 kg 95.7 kg    Recent Labs  Lab 01/25/23 0040  NA 139  K 4.5  CL 113*  CO2 16*  GLUCOSE 156*  BUN 98*  CREATININE 7.05*  CALCIUM 7.1*  PHOS 5.9*    Recent Labs  Lab 01/22/23 0608 01/23/23 0025 01/24/23 0059 01/25/23 0040  WBC 9.1 7.2 7.7 8.7  NEUTROABS 7.0  --   --   --   HGB 9.2* 8.8* 9.1* 8.5*  HCT 28.3* 26.8* 27.8* 26.2*  MCV 93.1 95.0 92.7 92.3  PLT 209 220 229 222    Scheduled Meds:  amLODipine  10 mg Oral Daily   atorvastatin  40 mg Oral Daily   Chlorhexidine Gluconate Cloth  6 each Topical Q0600   feeding supplement  237 mL Oral BID BM   furosemide  80 mg Intravenous Q12H   heparin sodium (porcine)       insulin aspart  0-15 Units Subcutaneous TID WC   insulin aspart  0-5 Units Subcutaneous QHS   levothyroxine  75 mcg Oral QAC breakfast   metoprolol succinate  25 mg Oral Daily   multivitamin with minerals  1 tablet Oral Daily   Continuous Infusions:  heparin 1,700 Units/hr (01/25/23 0406)   PRN Meds:.acetaminophen, glucagon (human recombinant), guaiFENesin, heparin sodium (porcine), hydrALAZINE, ipratropium-albuterol, metoprolol tartrate, ondansetron (ZOFRAN) IV, senna-docusate, traZODone   Irena Cords,  MD 01/25/2023, 9:13 AM

## 2023-01-25 NOTE — Progress Notes (Signed)
ANTICOAGULATION CONSULT NOTE  Pharmacy Consult for heparin Indication: atrial fibrillation (CVA 6/27)  No Known Allergies  Patient Measurements: Weight: 95.7 kg (210 lb 15.7 oz) (Bed Scale) Heparin Dosing Weight: 95.7 kg  Vital Signs: Temp: 98.5 F (36.9 C) (07/06 0838) Temp Source: Oral (07/06 0800) BP: 171/98 (07/06 0858) Pulse Rate: 72 (07/06 0858)  Labs: Recent Labs    01/22/23 1012 01/22/23 2030 01/23/23 0025 01/23/23 0601 01/23/23 1403 01/24/23 0059 01/25/23 0040  HGB  --    < > 8.8*  --   --  9.1* 8.5*  HCT  --   --  26.8*  --   --  27.8* 26.2*  PLT  --   --  220  --   --  229 222  APTT 38*   < > 64*   < > 58* 79* 38*  LABPROT 16.8*  --   --   --   --   --   --   INR 1.3*  --   --   --   --   --   --   HEPARINUNFRC >1.10*  --  0.97*  --   --  0.71* 0.25*  CREATININE  --   --  6.29*  --   --  6.58* 7.05*   < > = values in this interval not displayed.     Estimated Creatinine Clearance: 10.9 mL/min (A) (by C-G formula based on SCr of 7.05 mg/dL (H)).   Assessment: Patient is a 72 year old male presenting with SOB. 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 New to HD. HD cath placed 7/5 and first session of dialysis took place the morning of 7/6. Pharmacy consulted to dose heparin.  Apixaban on hold and transitioned to heparin. Last dose of apixaban was 7/2 @ 1900.  Heparin level supratherapeutic for 0.3-0.5 units/mL goal at 0.67 and heparin infusion running at 1700 units/h.  APTT >200. Doesn't seem to correlate with previous labs. Will continue monitoring with heparin level.  No issues with bleeding or infusion reported and CBC stable.  Goal of Therapy:  Heparin level 0.3-0.5 units/ml aPTT 66-85 seconds Monitor platelets by anticoagulation protocol: Yes   Plan:  Decrease heparin infusion to 1600 units/h Check heparin level daily Continue to monitor CBC and for symptoms of bleeding   Thank you for allowing pharmacy to be  a part of this patient's care.   Stephenie Acres, PharmD PGY1 Pharmacy Resident 01/25/2023 9:11 AM  **Pharmacist phone directory can be found on [email or web address] listed under [facility]**

## 2023-01-25 NOTE — Plan of Care (Signed)

## 2023-01-25 NOTE — Progress Notes (Signed)
   01/25/23 1209  Vitals  Temp 98.7 F (37.1 C)  Pulse Rate (!) 120  Resp 17  BP 120/82  SpO2 98 %  O2 Device Room Air  Oxygen Therapy  Patient Activity (if Appropriate) In bed  Post Treatment  Dialyzer Clearance Clear  Duration of HD Treatment -hour(s) 2.5 hour(s)  Hemodialysis Intake (mL) 0 mL  Liters Processed 42.5  Fluid Removed (mL) 1000 mL  Tolerated HD Treatment Yes  Post-Hemodialysis Comments Pt tolerated Treatment. RN came and brought medicine to pt for increase HR from [facility] RN   Received patient in bed to unit.  Alert and oriented.  Informed consent signed and in chart.   TX duration:2.5  Patient tolerated well.  Transported back to the room  Alert, without acute distress.  Hand-off given to patient's nurse.   Access used: Yes  Access issues: No  Total UF removed: 1000 Medication(s) given: See MAR Post HD VS: See Above GRID Post HD weight: 94. 7 kg   [facility]

## 2023-01-25 NOTE — Progress Notes (Signed)
PROGRESS NOTE    [name]  DUK:[id number] DOB: 1950/09/19 DOA: 01/22/2023 PCP: System, Provider Not In   Brief Narrative:  72 year old with history of past medical history of HTN, DM2, paroxysmal A-fib on Eliquis, CKD stage IV, recent CVA admitted to [facility] on Friday and diagnosed with CVA with residual left upper and lower extremity weakness. Eventually patient went home and then suddenly developed shortness of breath with change in mental status at home. Patient was found to have elevated BNP, creatinine of 5.96.  Hospitalist called for admission, nephrology team called in consult.  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 EF 55% Hypertensive emergency -Blood pressure more appropriately controlled  -Continues on room air -Continue IV Lasix, metoprolol, amlodipine -EKG without profound ST changes -Troponin minimally elevated not consistent with ACS   Acute on chronic kidney disease, stage IV -likely approaching ESRD Metabolic acidosis -Likely prerenal in the setting of hypertensive urgency complicated by chronic uncontrolled hypertension and diabetes -Continue to hold home ACE inhibitor -Baseline creatinine around 2.5, currently 7 -Dialysis completed today without complication -Vascular surgery following -tentatively planned 7/9 fistula creation.  If this cannot be done prior to  discharge okay to follow-up outpatient with vascular surgery given dialysis catheter placement currently functional. -Patient will need to be clipped prior to discharge   Hypertensive emergency, resolved -In the setting of AKI, respiratory distress and hypoxia -complicating his chronic uncontrolled hypertension due to noncompliance -Avoid tight blood pressure control to ensure patient is able to tolerate dialysis/avoid hypotension -Blood pressure improving on amlodipine 10, IV Lasix 80 twice daily, metoprolol succinate 25 -Hoping initiation of dialysis will also improve patient's volume status and blood pressure   History of paroxysmal atrial fibrillation -Continue heparin drip, patient on Eliquis at home, on hold given above   Non-insulin-dependent diabetes type 2, well-controlled -A1c 5.6, continue sliding scale insulin and hypoglycemic protocol while in hospital -Resume glipizide and diabetic diet at discharge   Hypothyroidism -Continue home levothyroxine 75   Recent CVA/ left sided weakness -Start low-dose aspirin and statin  Home med rec does not include aspirin likely in the setting of Eliquis.   DVT prophylaxis: Heparin drip Code Status: Full Family Communication: Daughter at bedside, lengthy discussion with daughter and patient about current prognosis, complications as well as his medical history as there appears to be a wide gap in knowledge amongst different family members.  Status is: Inpatient  Dispo: The patient is from: Home              Anticipated d/c is to: Home              Anticipated d/c date is: 24 to 48 hours              Patient currently not medically stable for discharge  Consultants:  Vascular surgery, nephrology  Procedures:  Temporary dialysis catheter placed 7/5, first hemodialysis session 7/6  Antimicrobials:  None indicated  Subjective: No acute issues or events  overnight denies nausea vomiting diarrhea constipation headache fevers chills or  chest pain.  Patient reports transient episode of tachycardia and "dizziness" at the end of dialysis.  Objective: Vitals:   01/24/23 1516 01/24/23 2124 01/25/23 0134 01/25/23 0427  BP: (!) 160/84 (!) 151/70 (!) 165/76 (!) 167/77  Pulse: 91 73 75 83  Resp: 16 17 18 18   Temp: (!) 97.3 F (36.3 C) 97.8 F (36.6 C) 98.6 F (37 C) 98.1 F (36.7 C)  TempSrc: Oral Oral Oral Oral  SpO2: 98% 98% 97% 98%  Weight:    96.1 kg    Intake/Output Summary (Last 24 hours) at 01/25/2023 0701 Last data filed at 01/25/2023 0406 Gross per 24 hour  Intake 886.48 ml  Output 1100 ml  Net -213.52 ml    Filed Weights   01/23/23 0009 01/24/23 0514 01/25/23 0427  Weight: 97 kg 100.2 kg 96.1 kg    Examination:  General:  Pleasantly resting in bed, No acute distress. HEENT:  Normocephalic atraumatic.  Sclerae nonicteric, noninjected.  Extraocular movements intact bilaterally. Neck:  Without mass or deformity.  Trachea is midline. Lungs:  Clear to auscultate bilaterally without rhonchi, wheeze, or rales. Heart:  Regular rate and rhythm.  Without murmurs, rubs, or gallops. Abdomen:  Soft, nontender, nondistended.  Without guarding or rebound. Extremities: Without cyanosis, clubbing, edema, or obvious deformity. Skin:  Warm and dry, no erythema. Temporary dialysis catheter clean dry intact right anterior chest wall  Data Reviewed: I have personally reviewed following labs and imaging studies  CBC: Recent Labs  Lab 01/22/23 0608 01/23/23 0025 01/24/23 0059 01/25/23 0040  WBC 9.1 7.2 7.7 8.7  NEUTROABS 7.0  --   --   --   HGB 9.2* 8.8* 9.1* 8.5*  HCT 28.3* 26.8* 27.8* 26.2*  MCV 93.1 95.0 92.7 92.3  PLT 209 220 229 222    Basic Metabolic Panel: Recent Labs  Lab 01/22/23 0606 01/23/23 0025 01/24/23 0059 01/25/23 0040  NA 140 140 140 139  K 4.1 4.6 4.5 4.5  CL 116* 115* 114* 113*  CO2 12* 13* 14* 16*  GLUCOSE 45* 121* 125* 156*  BUN 85* 91* 95* 98*  CREATININE 6.07* 6.29* 6.58* 7.05*   CALCIUM 7.3* 7.2* 7.2* 7.1*  MG 1.8 1.8 1.8 1.7  PHOS  --  6.3* 5.7* 5.9*    GFR: Estimated Creatinine Clearance: 10.9 mL/min (A) (by C-G formula based on SCr of 7.05 mg/dL (H)). Liver Function Tests: Recent Labs  Lab 01/22/23 0606 01/23/23 0025 01/24/23 0059 01/25/23 0040  AST 20  --   --   --   ALT 21  --   --   --   ALKPHOS 74  --   --   --   BILITOT 0.7  --   --   --   PROT 6.1*  --   --   --   ALBUMIN 2.7* 2.6* 2.6* 2.5*    No results for input(s): "LIPASE", "AMYLASE" in the last 168 hours. No results for input(s): "AMMONIA" in the last 168 hours. Coagulation Profile: Recent Labs  Lab 01/22/23 1012  INR 1.3*    No results for input(s): "HGBA1C" in the last 72 hours.  CBG: Recent Labs  Lab 01/24/23 0623 01/24/23 1159 01/24/23 1609 01/24/23 2125 01/25/23 0556  GLUCAP 104* 152* 170* 166* 125*     Anemia Panel: Recent Labs    01/22/23 1359 01/23/23 0025  FOLATE  --  7.8  TIBC 216*  --   IRON 44*  --  Sepsis Labs: Recent Labs  Lab 01/22/23 0606 01/22/23 0820  LATICACIDVEN 0.7 0.7     No results found for this or any previous visit (from the past 240 hour(s)).       Radiology Studies: IR Fluoro Guide CV Line Right  Result Date: 01/24/2023 INDICATION: dialysis catheter placement EXAM: TUNNELED CENTRAL VENOUS HEMODIALYSIS CATHETER PLACEMENT WITH ULTRASOUND AND FLUOROSCOPIC GUIDANCE MEDICATIONS: Ancef 1 gm IV . The antibiotic was given in an appropriate time interval prior to skin puncture. ANESTHESIA/SEDATION: Local anesthetic and single agent sedation was employed during this procedure. A total of Versed 1 mg was administered intravenously. The patient's level of consciousness and vital signs were monitored continuously by radiology nursing throughout the procedure under my direct supervision. FLUOROSCOPY TIME:  Fluoroscopic dose; 0.1 mGy COMPLICATIONS: None immediate. PROCEDURE: Informed written consent was obtained from the patient and/or  patient's representative after a discussion of the risks, benefits, and alternatives to treatment. Questions regarding the procedure were encouraged and answered. The RIGHT neck and chest were prepped with chlorhexidine in a sterile fashion, and a sterile drape was applied covering the operative field. Maximum barrier sterile technique with sterile gowns and gloves were used for the procedure. A timeout was performed prior to the initiation of the procedure. After creating a small venotomy incision, a micropuncture kit was utilized to access the internal jugular vein. Real-time ultrasound guidance was utilized for vascular access including the acquisition of a permanent ultrasound image documenting patency of the accessed vessel. The microwire was utilized to measure appropriate catheter length. A stiff Glidewire was advanced to the level of the IVC and the micropuncture sheath was exchanged for a peel-away sheath. A palindrome tunneled hemodialysis catheter measuring 19 cm from tip to cuff was tunneled in a retrograde fashion from the anterior chest wall to the venotomy incision. The catheter was then placed through the peel-away sheath with tips ultimately positioned within the superior aspect of the right atrium. Final catheter positioning was confirmed and documented with a spot radiographic image. The catheter aspirates and flushes normally. The catheter was flushed with appropriate volume heparin dwells. The catheter exit site was secured with a 2-0 Ethilon retention suture. The venotomy incision was closed with Dermabond. Dressings were applied. The patient tolerated the procedure well without immediate post procedural complication. IMPRESSION: Successful placement of 19 cm tip to cuff tunneled hemodialysis catheter via the RIGHT internal jugular vein The tip of the catheter is positioned at the superior cavo-atrial junction. The catheter is ready for immediate use. Roanna Banning, MD [facility] Radiology Electronically Signed   By: Roanna Banning M.D.   On: 01/24/2023 16:22   IR US Guide Vasc Access Right  Result Date: 01/24/2023 INDICATION: dialysis catheter placement EXAM: TUNNELED CENTRAL VENOUS HEMODIALYSIS CATHETER PLACEMENT WITH ULTRASOUND AND FLUOROSCOPIC GUIDANCE MEDICATIONS: Ancef 1 gm IV . The antibiotic was given in an appropriate time interval prior to skin puncture. ANESTHESIA/SEDATION: Local anesthetic and single agent sedation was employed during this procedure. A total of Versed 1 mg was administered intravenously. The patient's level of consciousness and vital signs were monitored continuously by radiology nursing throughout the procedure under my direct supervision. FLUOROSCOPY TIME:  Fluoroscopic dose; 0.1 mGy COMPLICATIONS: None immediate. PROCEDURE: Informed written consent was obtained from the patient and/or patient's representative after a discussion of the risks, benefits, and alternatives to treatment. Questions regarding the procedure were encouraged and answered. The RIGHT neck and chest were prepped with chlorhexidine in a sterile fashion, and  a sterile drape was applied covering the operative field. Maximum barrier sterile technique with sterile gowns and gloves were used for the procedure. A timeout was performed prior to the initiation of the procedure. After creating a small venotomy incision, a micropuncture kit was utilized to access the internal jugular vein. Real-time ultrasound guidance was utilized for vascular access including the acquisition of a permanent ultrasound image documenting patency of the accessed vessel. The microwire was utilized to measure appropriate catheter length. A stiff Glidewire was advanced to the level of the IVC and the micropuncture sheath was exchanged for a peel-away sheath. A palindrome tunneled hemodialysis catheter measuring 19 cm from tip to cuff was tunneled in a retrograde fashion from the anterior  chest wall to the venotomy incision. The catheter was then placed through the peel-away sheath with tips ultimately positioned within the superior aspect of the right atrium. Final catheter positioning was confirmed and documented with a spot radiographic image. The catheter aspirates and flushes normally. The catheter was flushed with appropriate volume heparin dwells. The catheter exit site was secured with a 2-0 Ethilon retention suture. The venotomy incision was closed with Dermabond. Dressings were applied. The patient tolerated the procedure well without immediate post procedural complication. IMPRESSION: Successful placement of 19 cm tip to cuff tunneled hemodialysis catheter via the RIGHT internal jugular vein The tip of the catheter is positioned at the superior cavo-atrial junction. The catheter is ready for immediate use. Roanna Banning, MD [facility] Radiology Electronically Signed   By: Roanna Banning M.D.   On: 01/24/2023 16:22   VAS [place] UPPER EXT VEIN MAPPING (PRE-OP AVF)  Result Date: 01/24/2023 UPPER EXTREMITY VEIN MAPPING Patient Name:  [name]  Date of Exam:   01/23/2023 Medical Rec #: [id number]              Accession #:    [id number] Date of Birth: 24-Sep-1950              Patient Gender: M Patient Age:   42 years Exam Location:  [facility] Procedure:      VAS [place] UPPER EXT VEIN MAPPING (PRE-OP AVF) Referring Phys: Lemar Livings --------------------------------------------------------------------------------  Indications: Pre-access. History: CKD 5.  Limitations: Bandaging, caliber of vessels Comparison Study: No prior studies. Performing Technologist: Jean Rosenthal RDMS, RVT  Examination Guidelines: A complete evaluation includes B-mode imaging, spectral Doppler, color Doppler, and power Doppler as needed of all accessible portions of each vessel. Bilateral testing is considered an integral part of a complete examination. Limited  examinations for reoccurring indications may be performed as noted. +-----------------+-------------+----------+--------------+ Right Cephalic   Diameter (cm)Depth (cm)   Findings    +-----------------+-------------+----------+--------------+ Shoulder             0.10        0.63                  +-----------------+-------------+----------+--------------+ Prox upper arm       0.12        0.40                  +-----------------+-------------+----------+--------------+ Mid upper arm        0.08        0.19                  +-----------------+-------------+----------+--------------+ Dist upper arm  not visualized +-----------------+-------------+----------+--------------+ Antecubital fossa                         Bandaging    +-----------------+-------------+----------+--------------+ Prox forearm         0.36        0.38                  +-----------------+-------------+----------+--------------+ Mid forearm          0.31        0.17                  +-----------------+-------------+----------+--------------+ Dist forearm         0.30        0.23                  +-----------------+-------------+----------+--------------+ Wrist                0.20        0.59                  +-----------------+-------------+----------+--------------+ +-----------------+-------------+----------+---------+ Right Basilic    Diameter (cm)Depth (cm)Findings  +-----------------+-------------+----------+---------+ Mid upper arm        0.46                         +-----------------+-------------+----------+---------+ Dist upper arm       0.44                         +-----------------+-------------+----------+---------+ Antecubital fossa    0.34               branching +-----------------+-------------+----------+---------+ Prox forearm         0.36                         +-----------------+-------------+----------+---------+ Mid forearm           0.17                         +-----------------+-------------+----------+---------+ Distal forearm       0.19                         +-----------------+-------------+----------+---------+ Wrist                0.15                         +-----------------+-------------+----------+---------+ +-----------------+-------------+----------+--------------+ Left Cephalic    Diameter (cm)Depth (cm)   Findings    +-----------------+-------------+----------+--------------+ Shoulder                                not visualized +-----------------+-------------+----------+--------------+ Prox upper arm                          not visualized +-----------------+-------------+----------+--------------+ Mid upper arm                           not visualized +-----------------+-------------+----------+--------------+ Dist upper arm                          not visualized +-----------------+-------------+----------+--------------+ Antecubital fossa  not visualized +-----------------+-------------+----------+--------------+ Prox forearm         0.20        0.32                  +-----------------+-------------+----------+--------------+ Mid forearm          0.14        0.54                  +-----------------+-------------+----------+--------------+ Dist forearm         0.14        0.40                  +-----------------+-------------+----------+--------------+ Wrist                0.12        0.36                  +-----------------+-------------+----------+--------------+ +-----------------+-------------+----------+---------+ Left Basilic     Diameter (cm)Depth (cm)Findings  +-----------------+-------------+----------+---------+ Prox upper arm       0.58                         +-----------------+-------------+----------+---------+ Mid upper arm        0.29                          +-----------------+-------------+----------+---------+ Dist upper arm       0.36                         +-----------------+-------------+----------+---------+ Antecubital fossa    0.24                         +-----------------+-------------+----------+---------+ Prox forearm         0.18               branching +-----------------+-------------+----------+---------+ Mid forearm          0.18                         +-----------------+-------------+----------+---------+ Distal forearm       0.17                         +-----------------+-------------+----------+---------+ Wrist                0.17                         +-----------------+-------------+----------+---------+ *See table(s) above for measurements and observations.  Diagnosing physician: Lemar Livings MD Electronically signed by Lemar Livings MD on 01/24/2023 at 12:45:37 PM.    Final    ECHOCARDIOGRAM COMPLETE  Result Date: 01/23/2023    ECHOCARDIOGRAM REPORT   Patient Name:   [name] Date of Exam: 01/23/2023 Medical Rec #:  [id number]             Height:       71.0 in Accession #:    [id number]            Weight:       213.8 lb Date of Birth:  Aug 30, 1950             BSA:          2.169 m Patient Age:    69 years  BP:           171/84 mmHg Patient Gender: M                     HR:           84 bpm. Exam Location:  Inpatient Procedure: 2D Echo, Color Doppler and Cardiac Doppler Indications:    CHF  History:        Patient has no prior history of Echocardiogram examinations.                 CHF, PE, CKD and Stroke, Arrythmias:Atrial Fibrillation; Risk                 Factors:Hypertension and Diabetes.  Sonographer:    Milbert Coulter Referring Phys: 37 DEBBY CROSLEY  Sonographer Comments: Image acquisition challenging due to respiratory motion and Image acquisition challenging due to patient body habitus. IMPRESSIONS  1. Left ventricular ejection fraction, by estimation, is 55 to 60%. The left ventricle  has normal function. The left ventricle has no regional wall motion abnormalities. There is moderate left ventricular hypertrophy. Left ventricular diastolic parameters are consistent with Grade I diastolic dysfunction (impaired relaxation).  2. Right ventricular systolic function is normal. The right ventricular size is normal.  3. The mitral valve is normal in structure. No evidence of mitral valve regurgitation. No evidence of mitral stenosis.  4. Degree of AR not well interrogated No suprasternal notch doppler and poor color flow but does not seen severe Consider f/u imaging for aortic root enlargment with CTA or MRA . The aortic valve is tricuspid. There is mild calcification of the aortic valve. There is mild thickening of the aortic valve. Aortic valve regurgitation is mild. Aortic valve sclerosis is present, with no evidence of aortic valve stenosis.  5. Aortic dilatation noted. There is severe dilatation of the aortic root, measuring 48 mm.  6. The inferior vena cava is normal in size with greater than 50% respiratory variability, suggesting right atrial pressure of 3 mmHg. FINDINGS  Left Ventricle: Left ventricular ejection fraction, by estimation, is 55 to 60%. The left ventricle has normal function. The left ventricle has no regional wall motion abnormalities. The left ventricular internal cavity size was normal in size. There is  moderate left ventricular hypertrophy. Left ventricular diastolic parameters are consistent with Grade I diastolic dysfunction (impaired relaxation). Right Ventricle: The right ventricular size is normal. No increase in right ventricular wall thickness. Right ventricular systolic function is normal. Left Atrium: Left atrial size was normal in size. Right Atrium: Right atrial size was normal in size. Pericardium: There is no evidence of pericardial effusion. Mitral Valve: The mitral valve is normal in structure. No evidence of mitral valve regurgitation. No evidence of mitral  valve stenosis. Tricuspid Valve: The tricuspid valve is normal in structure. Tricuspid valve regurgitation is not demonstrated. No evidence of tricuspid stenosis. Aortic Valve: Degree of AR not well interrogated No suprasternal notch doppler and poor color flow but does not seen severe Consider f/u imaging for aortic root enlargment with CTA or MRA. The aortic valve is tricuspid. There is mild calcification of the  aortic valve. There is mild thickening of the aortic valve. Aortic valve regurgitation is mild. Aortic valve sclerosis is present, with no evidence of aortic valve stenosis. Aortic valve mean gradient measures 4.0 mmHg. Aortic valve peak gradient measures 7.2 mmHg. Aortic valve area, by VTI measures 3.68 cm. Pulmonic Valve: The pulmonic valve was normal in structure. Pulmonic  valve regurgitation is not visualized. No evidence of pulmonic stenosis. Aorta: Aortic dilatation noted. There is severe dilatation of the aortic root, measuring 48 mm. Venous: The inferior vena cava is normal in size with greater than 50% respiratory variability, suggesting right atrial pressure of 3 mmHg. IAS/Shunts: No atrial level shunt detected by color flow Doppler.  LEFT VENTRICLE PLAX 2D LVIDd:         5.10 cm   Diastology LVIDs:         3.80 cm   LV e' medial:    7.94 cm/s LV PW:         1.10 cm   LV E/e' medial:  8.6 LV IVS:        1.40 cm   LV e' lateral:   7.07 cm/s LVOT diam:     2.30 cm   LV E/e' lateral: 9.6 LV SV:         100 LV SV Index:   46 LVOT Area:     4.15 cm  RIGHT VENTRICLE RV S prime:     19.10 cm/s TAPSE (M-mode): 2.9 cm LEFT ATRIUM             Index        RIGHT ATRIUM           Index LA diam:        3.40 cm 1.57 cm/m   RA Area:     16.10 cm LA Vol (A2C):   54.0 ml 24.89 ml/m  RA Volume:   42.40 ml  19.54 ml/m LA Vol (A4C):   56.6 ml 26.09 ml/m LA Biplane Vol: 59.3 ml 27.33 ml/m  AORTIC VALVE AV Area (Vmax):    3.75 cm AV Area (Vmean):   3.62 cm AV Area (VTI):     3.68 cm AV Vmax:            134.00 cm/s AV Vmean:          92.700 cm/s AV VTI:            0.271 m AV Peak Grad:      7.2 mmHg AV Mean Grad:      4.0 mmHg LVOT Vmax:         121.00 cm/s LVOT Vmean:        80.700 cm/s LVOT VTI:          0.240 m LVOT/AV VTI ratio: 0.89  AORTA Ao Root diam: 4.70 cm Ao Asc diam:  4.80 cm MITRAL VALVE                TRICUSPID VALVE MV Area (PHT): 4.36 cm     TR Peak grad:   39.7 mmHg MV Decel Time: 174 msec     TR Vmax:        315.00 cm/s MV E velocity: 68.00 cm/s MV A velocity: 145.00 cm/s  SHUNTS MV E/A ratio:  0.47         Systemic VTI:  0.24 m                             Systemic Diam: 2.30 cm Charlton Haws MD Electronically signed by Charlton Haws MD Signature Date/Time: 01/23/2023/1:35:14 PM    Final      Scheduled Meds:  amLODipine  10 mg Oral Daily   atorvastatin  40 mg Oral Daily   Chlorhexidine Gluconate Cloth  6 each Topical Q0600   feeding supplement  237 mL  Oral BID BM   furosemide  80 mg Intravenous Q12H   insulin aspart  0-15 Units Subcutaneous TID WC   insulin aspart  0-5 Units Subcutaneous QHS   levothyroxine  75 mcg Oral QAC breakfast   metoprolol succinate  25 mg Oral Daily   multivitamin with minerals  1 tablet Oral Daily   Continuous Infusions:  heparin 1,700 Units/hr (01/25/23 0406)     LOS: 3 days   Time spent: 55 min was spent at bedside discussing case with family, as well as reviewing chart  Azucena Fallen, DO [facility]  If 7PM-7AM, please contact night-coverage [email or web address]  01/25/2023, 7:01 AM

## 2023-01-25 NOTE — Progress Notes (Signed)
ANTICOAGULATION CONSULT NOTE - Follow Up Consult  Pharmacy Consult for heparin Indication: atrial fibrillation in setting of recent stroke  Labs: Recent Labs     0000 01/22/23 0606 01/22/23 0608 01/22/23 0820 01/22/23 1012 01/22/23 2030 01/23/23 0025 01/23/23 0601 01/23/23 1403 01/24/23 0059 01/25/23 0040  HGB   < >  --  9.2*  --   --   --  8.8*  --   --  9.1* 8.5*  HCT   < >  --  28.3*  --   --   --  26.8*  --   --  27.8* 26.2*  PLT   < >  --  209  --   --   --  220  --   --  229 222  APTT  --   --   --   --  38*   < > 64*   < > 58* 79* 38*  LABPROT  --   --   --   --  16.8*  --   --   --   --   --   --   INR  --   --   --   --  1.3*  --   --   --   --   --   --   HEPARINUNFRC   < >  --   --   --  >1.10*  --  0.97*  --   --  0.71* 0.25*  CREATININE  --  6.07*  --   --   --   --  6.29*  --   --  6.58*  --   TROPONINIHS  --   --  35* 40*  --   --   --   --   --   --   --    < > = values in this interval not displayed.    Assessment: 72yo male subtherapeutic on heparin after two PTTs at goal on current rate; no infusion issues or signs of bleeding per RN.  Goal of Therapy:  Heparin level 0.3-0.5 units/ml aPTT 66-85 seconds   Plan:  Increase heparin infusion by 1-2 units/kg/hr to 1700 units/hr. Check level in 8 hours.   Vernard Gambles, PharmD, BCPS 01/25/2023 1:35 AM

## 2023-01-25 NOTE — Plan of Care (Signed)
  Problem: Education: Goal: Knowledge of General Education information will improve Description: Including pain rating scale, medication(s)/side effects and non-pharmacologic comfort measures Outcome: Progressing   Problem: Health Behavior/Discharge Planning: Goal: Ability to manage health-related needs will improve Outcome: Progressing   Problem: Clinical Measurements: Goal: Ability to maintain clinical measurements within normal limits will improve Outcome: Progressing Goal: Will remain free from infection Outcome: Progressing Goal: Respiratory complications will improve Outcome: Progressing   Problem: Activity: Goal: Risk for activity intolerance will decrease Outcome: Progressing   Problem: Nutrition: Goal: Adequate nutrition will be maintained Outcome: Progressing   Problem: Coping: Goal: Level of anxiety will decrease Outcome: Progressing   

## 2023-01-26 ENCOUNTER — Encounter (HOSPITAL_COMMUNITY): Payer: Self-pay | Admitting: Family Medicine

## 2023-01-26 LAB — CBC
HCT: 24.8 % — ABNORMAL LOW (ref 39.0–52.0)
Hemoglobin: 8.3 g/dL — ABNORMAL LOW (ref 13.0–17.0)
MCH: 30.2 pg (ref 26.0–34.0)
MCHC: 33.5 g/dL (ref 30.0–36.0)
MCV: 90.2 fL (ref 80.0–100.0)
Platelets: 226 10*3/uL (ref 150–400)
RBC: 2.75 MIL/uL — ABNORMAL LOW (ref 4.22–5.81)
RDW: 12.9 % (ref 11.5–15.5)
WBC: 9.3 10*3/uL (ref 4.0–10.5)
nRBC: 0 % (ref 0.0–0.2)

## 2023-01-26 LAB — RENAL FUNCTION PANEL
Albumin: 2.4 g/dL — ABNORMAL LOW (ref 3.5–5.0)
Anion gap: 12 (ref 5–15)
BUN: 60 mg/dL — ABNORMAL HIGH (ref 8–23)
CO2: 20 mmol/L — ABNORMAL LOW (ref 22–32)
Calcium: 7.2 mg/dL — ABNORMAL LOW (ref 8.9–10.3)
Chloride: 104 mmol/L (ref 98–111)
Creatinine, Ser: 5.18 mg/dL — ABNORMAL HIGH (ref 0.61–1.24)
GFR, Estimated: 11 mL/min — ABNORMAL LOW (ref >60)
Glucose, Bld: 128 mg/dL — ABNORMAL HIGH (ref 70–99)
Phosphorus: 4.4 mg/dL (ref 2.5–4.6)
Potassium: 3.9 mmol/L (ref 3.5–5.1)
Sodium: 136 mmol/L (ref 135–145)

## 2023-01-26 LAB — GLUCOSE, CAPILLARY
Glucose-Capillary: 122 mg/dL — ABNORMAL HIGH (ref 70–99)
Glucose-Capillary: 240 mg/dL — ABNORMAL HIGH (ref 70–99)
Glucose-Capillary: 140 mg/dL — ABNORMAL HIGH (ref 70–99)

## 2023-01-26 LAB — HEPARIN LEVEL (UNFRACTIONATED): Heparin Unfractionated: 0.34 IU/mL (ref 0.30–0.70)

## 2023-01-26 LAB — MRSA NEXT GEN BY PCR, NASAL: MRSA by PCR Next Gen: NOT DETECTED

## 2023-01-26 LAB — MAGNESIUM: Magnesium: 1.6 mg/dL — ABNORMAL LOW (ref 1.7–2.4)

## 2023-01-26 MED ORDER — MAGNESIUM SULFATE 2 GM/50ML IV SOLN
2.0000 g | Freq: Once | INTRAVENOUS | Status: AC
  Administered 2023-01-26: 2 g via INTRAVENOUS
  Filled 2023-01-26: qty 50

## 2023-01-26 MED ORDER — SODIUM CHLORIDE 0.9 % IV SOLN
200.0000 mg | Freq: Every day | INTRAVENOUS | Status: AC
  Administered 2023-01-27: 200 mg via INTRAVENOUS
  Filled 2023-01-26 (×2): qty 10

## 2023-01-26 NOTE — Progress Notes (Signed)
PROGRESS NOTE    [name]  ZOX:[id number] DOB: 1951/01/24 DOA: 01/22/2023 PCP: System, Provider Not In   Brief Narrative:  72 year old with history of past medical history of HTN, DM2, paroxysmal A-fib on Eliquis, CKD stage IV, recent CVA admitted to [facility] on Friday and diagnosed with CVA with residual left upper and lower extremity weakness. Eventually patient went home and then suddenly developed shortness of breath with change in mental status at home. Patient was found to have elevated BNP, creatinine of 5.96.  Hospitalist called for admission, nephrology team called in consult.  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 EF 55% Hypertensive emergency -Blood pressure more appropriately controlled on new regimen -Continues on room air, without hypoxia/symptoms during ambulation -Continue IV Lasix, metoprolol, amlodipine - follow along with Nephrology -EKG without profound ST changes -Troponin minimally elevated at intake - not consistent with ACS   Acute on chronic kidney disease, stage IV -likely advancing to ESRD Metabolic acidosis -Likely prerenal in the setting of hypertensive urgency complicated by chronic uncontrolled hypertension and diabetes -Continue to hold home ACE inhibitor -Baseline creatinine around 2.5 - following dialysis BUN/Cr returning to previous levels. -First  dialysis completed 7/6 without complication -Vascular surgery following -tentatively planned 7/9 fistula creation.  If this cannot be done prior to discharge okay to follow-up outpatient with vascular surgery given dialysis catheter placement currently functional. -Patient will need to be clipped prior to discharge   Hypertensive emergency, resolved -In the setting of AKI, respiratory distress and hypoxia -complicating his chronic uncontrolled hypertension due to noncompliance -Avoid tight blood pressure control to ensure patient is able to tolerate dialysis/avoid hypotension -Blood pressure improving on amlodipine 10, IV Lasix 80 twice daily, metoprolol succinate 25 -Hoping initiation of dialysis will also improve patient's volume status and blood pressure   History of paroxysmal atrial fibrillation -Continue heparin drip, patient on Eliquis at home, on hold given above   Non-insulin-dependent diabetes type 2, well-controlled -A1c 5.6, continue sliding scale insulin and hypoglycemic protocol while in hospital -Resume glipizide and diabetic diet at discharge   Hypothyroidism -Continue home levothyroxine 75   Recent CVA/ left sided weakness -Start low-dose aspirin and statin  Home med rec does not include aspirin likely in the setting of Eliquis.   DVT prophylaxis: Heparin drip Code Status: Full Family Communication: None present  Status is: Inpatient  Dispo: The patient is from: Home              Anticipated d/c is to: Home              Anticipated d/c date is: 24 to 48 hours              Patient currently not medically stable for discharge  Consultants:  Vascular surgery, nephrology  Procedures:  Temporary dialysis catheter placed 7/5, first hemodialysis session 7/6  Antimicrobials:  None indicated  Subjective: No acute issues or events overnight denies nausea vomiting diarrhea constipation headache fevers chills  or chest pain.   Objective: Vitals:   01/25/23 1829  01/25/23 2000 01/25/23 2347 01/26/23 0524  BP:  (!) 161/88 (!) 159/85 (!) 160/87  Pulse:  74 71 76  Resp: 17 19 17 17   Temp:  98.1 F (36.7 C) 97.9 F (36.6 C) 98 F (36.7 C)  TempSrc:  Oral Oral Oral  SpO2:  96% 98% 97%  Weight:    94.9 kg    Intake/Output Summary (Last 24 hours) at 01/26/2023 0716 Last data filed at 01/26/2023 0532 Gross per 24 hour  Intake 651.96 ml  Output 2151 ml  Net -1499.04 ml    Filed Weights   01/25/23 0832 01/25/23 1203 01/26/23 0524  Weight: 95.7 kg 94.7 kg 94.9 kg    Examination:  General:  Pleasantly resting in bed, No acute distress. HEENT:  Normocephalic atraumatic.  Sclerae nonicteric, noninjected.  Extraocular movements intact bilaterally. Neck:  Without mass or deformity.  Trachea is midline. Lungs:  Clear to auscultate bilaterally without rhonchi, wheeze, or rales. Heart:  Regular rate and rhythm.  Without murmurs, rubs, or gallops. Abdomen:  Soft, nontender, nondistended.  Without guarding or rebound. Extremities: Without cyanosis, clubbing, edema, or obvious deformity. Skin:  Warm and dry, no erythema. Temporary dialysis catheter clean dry intact right anterior chest wall  Data Reviewed: I have personally reviewed following labs and imaging studies  CBC: Recent Labs  Lab 01/22/23 0608 01/23/23 0025 01/24/23 0059 01/25/23 0040 01/26/23 0043  WBC 9.1 7.2 7.7 8.7 9.3  NEUTROABS 7.0  --   --   --   --   HGB 9.2* 8.8* 9.1* 8.5* 8.3*  HCT 28.3* 26.8* 27.8* 26.2* 24.8*  MCV 93.1 95.0 92.7 92.3 90.2  PLT 209 220 229 222 226    Basic Metabolic Panel: Recent Labs  Lab 01/22/23 0606 01/23/23 0025 01/24/23 0059 01/25/23 0040 01/26/23 0043  NA 140 140 140 139 136  K 4.1 4.6 4.5 4.5 3.9  CL 116* 115* 114* 113* 104  CO2 12* 13* 14* 16* 20*  GLUCOSE 45* 121* 125* 156* 128*  BUN 85* 91* 95* 98* 60*  CREATININE 6.07* 6.29* 6.58* 7.05* 5.18*  CALCIUM 7.3* 7.2* 7.2* 7.1* 7.2*  MG 1.8 1.8 1.8 1.7 1.6*  PHOS  --  6.3* 5.7* 5.9* 4.4     GFR: Estimated Creatinine Clearance: 14.7 mL/min (A) (by C-G formula based on SCr of 5.18 mg/dL (H)). Liver Function Tests: Recent Labs  Lab 01/22/23 0606 01/23/23 0025 01/24/23 0059 01/25/23 0040 01/26/23 0043  AST 20  --   --   --   --   ALT 21  --   --   --   --   ALKPHOS 74  --   --   --   --   BILITOT 0.7  --   --   --   --   PROT 6.1*  --   --   --   --   ALBUMIN 2.7* 2.6* 2.6* 2.5* 2.4*    No results for input(s): "LIPASE", "AMYLASE" in the last 168 hours. No results for input(s): "AMMONIA" in the last 168 hours. Coagulation Profile: Recent Labs  Lab 01/22/23 1012  INR 1.3*    No results for input(s): "HGBA1C" in the last 72 hours.  CBG: Recent Labs  Lab 01/25/23 0556 01/25/23 1221 01/25/23 1610 01/25/23 2053 01/26/23 0609  GLUCAP 125* 147* 236* 162* 122*     Anemia Panel: No results for input(s): "VITAMINB12", "FOLATE", "FERRITIN", "TIBC", "IRON", "RETICCTPCT" in  the last 72 hours.  Sepsis Labs: Recent Labs  Lab 01/22/23 0606 01/22/23 0820  LATICACIDVEN 0.7 0.7     No results found for this or any previous visit (from the past 240 hour(s)).       Radiology Studies: IR Fluoro Guide CV Line Right  Result Date: 01/24/2023 INDICATION: dialysis catheter placement EXAM: TUNNELED CENTRAL VENOUS HEMODIALYSIS CATHETER PLACEMENT WITH ULTRASOUND AND FLUOROSCOPIC GUIDANCE MEDICATIONS: Ancef 1 gm IV . The antibiotic was given in an appropriate time interval prior to skin puncture. ANESTHESIA/SEDATION: Local anesthetic and single agent sedation was employed during this procedure. A total of Versed 1 mg was administered intravenously. The patient's level of consciousness and vital signs were monitored continuously by radiology nursing throughout the procedure under my direct supervision. FLUOROSCOPY TIME:  Fluoroscopic dose; 0.1 mGy COMPLICATIONS: None immediate. PROCEDURE: Informed written consent was obtained from the patient and/or patient's  representative after a discussion of the risks, benefits, and alternatives to treatment. Questions regarding the procedure were encouraged and answered. The RIGHT neck and chest were prepped with chlorhexidine in a sterile fashion, and a sterile drape was applied covering the operative field. Maximum barrier sterile technique with sterile gowns and gloves were used for the procedure. A timeout was performed prior to the initiation of the procedure. After creating a small venotomy incision, a micropuncture kit was utilized to access the internal jugular vein. Real-time ultrasound guidance was utilized for vascular access including the acquisition of a permanent ultrasound image documenting patency of the accessed vessel. The microwire was utilized to measure appropriate catheter length. A stiff Glidewire was advanced to the level of the IVC and the micropuncture sheath was exchanged for a peel-away sheath. A palindrome tunneled hemodialysis catheter measuring 19 cm from tip to cuff was tunneled in a retrograde fashion from the anterior chest wall to the venotomy incision. The catheter was then placed through the peel-away sheath with tips ultimately positioned within the superior aspect of the right atrium. Final catheter positioning was confirmed and documented with a spot radiographic image. The catheter aspirates and flushes normally. The catheter was flushed with appropriate volume heparin dwells. The catheter exit site was secured with a 2-0 Ethilon retention suture. The venotomy incision was closed with Dermabond. Dressings were applied. The patient tolerated the procedure well without immediate post procedural complication. IMPRESSION: Successful placement of 19 cm tip to cuff tunneled hemodialysis catheter via the RIGHT internal jugular vein The tip of the catheter is positioned at the superior cavo-atrial junction. The catheter is ready for immediate use. Roanna Banning, MD [facility] Radiology Electronically Signed   By: Roanna Banning M.D.   On: 01/24/2023 16:22   IR US Guide Vasc Access Right  Result Date: 01/24/2023 INDICATION: dialysis catheter placement EXAM: TUNNELED CENTRAL VENOUS HEMODIALYSIS CATHETER PLACEMENT WITH ULTRASOUND AND FLUOROSCOPIC GUIDANCE MEDICATIONS: Ancef 1 gm IV . The antibiotic was given in an appropriate time interval prior to skin puncture. ANESTHESIA/SEDATION: Local anesthetic and single agent sedation was employed during this procedure. A total of Versed 1 mg was administered intravenously. The patient's level of consciousness and vital signs were monitored continuously by radiology nursing throughout the procedure under my direct supervision. FLUOROSCOPY TIME:  Fluoroscopic dose; 0.1 mGy COMPLICATIONS: None immediate. PROCEDURE: Informed written consent was obtained from the patient and/or patient's representative after a discussion of the risks, benefits, and alternatives to treatment. Questions regarding the procedure were encouraged and answered. The RIGHT neck and chest were prepped with chlorhexidine  in a sterile fashion, and a sterile drape was applied covering the operative field. Maximum barrier sterile technique with sterile gowns and gloves were used for the procedure. A timeout was performed prior to the initiation of the procedure. After creating a small venotomy incision, a micropuncture kit was utilized to access the internal jugular vein. Real-time ultrasound guidance was utilized for vascular access including the acquisition of a permanent ultrasound image documenting patency of the accessed vessel. The microwire was utilized to measure appropriate catheter length. A stiff Glidewire was advanced to the level of the IVC and the micropuncture sheath was exchanged for a peel-away sheath. A palindrome tunneled hemodialysis catheter measuring 19 cm from tip to cuff was tunneled in a retrograde fashion from the anterior chest wall  to the venotomy incision. The catheter was then placed through the peel-away sheath with tips ultimately positioned within the superior aspect of the right atrium. Final catheter positioning was confirmed and documented with a spot radiographic image. The catheter aspirates and flushes normally. The catheter was flushed with appropriate volume heparin dwells. The catheter exit site was secured with a 2-0 Ethilon retention suture. The venotomy incision was closed with Dermabond. Dressings were applied. The patient tolerated the procedure well without immediate post procedural complication. IMPRESSION: Successful placement of 19 cm tip to cuff tunneled hemodialysis catheter via the RIGHT internal jugular vein The tip of the catheter is positioned at the superior cavo-atrial junction. The catheter is ready for immediate use. Roanna Banning, MD [facility] Radiology Electronically Signed   By: Roanna Banning M.D.   On: 01/24/2023 16:22     Scheduled Meds:  amLODipine  10 mg Oral Daily   atorvastatin  40 mg Oral Daily   Chlorhexidine Gluconate Cloth  6 each Topical Q0600   darbepoetin (ARANESP) injection - DIALYSIS  60 mcg Subcutaneous Q Sat-1800   feeding supplement  237 mL Oral BID BM   furosemide  80 mg Intravenous Q12H   insulin aspart  0-15 Units Subcutaneous TID WC   insulin aspart  0-5 Units Subcutaneous QHS   levothyroxine  75 mcg Oral QAC breakfast   metoprolol succinate  25 mg Oral Daily   multivitamin with minerals  1 tablet Oral Daily   Continuous Infusions:  heparin 1,600 Units/hr (01/26/23 0528)   iron sucrose Stopped (01/25/23 1230)     LOS: 4 days   Time spent: 55 min was spent at bedside discussing case with family, as well as reviewing chart  Azucena Fallen, DO [facility]  If 7PM-7AM, please contact night-coverage [email or web address]  01/26/2023, 7:16 AM

## 2023-01-26 NOTE — Progress Notes (Signed)
Physical Therapy Treatment Patient Details Name: [name] MRN: [id number] DOB: September 21, 1950 Today's Date: 01/26/2023   History of Present Illness 72 y.o. male presents to [facility] hospital on 01/22/2023 with SOB and AMS, found to be in acute respiratory failure 2/2 pulmonary edema along with acute diastolic congestive heart failure. 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 PMH includes HTN, DMII, PAF, CKD IV.    PT Comments  The pt was agreeable to session with focus on progressing mobility, and hopeful for stair training, pt remains limited in endurance and unable to ambulate distance to stairwell this session. Pt SOB with walking, but SpO2 >93% on RA. Will continue to benefit from skilled PT to progress endurance, stability, and gait mechanics as pt with narrow BOS and intermittent scissoring steps especially with turning and fatigue which increased fall risk with mobility.     Assistance Recommended at Discharge Set up Supervision/Assistance  If plan is discharge home, recommend the following:  Can travel by private vehicle    Help with stairs or ramp for entrance;Assist for transportation;A little help with walking and/or transfers      Equipment Recommendations  None recommended by PT    Recommendations for Other Services       Precautions / Restrictions Precautions Precautions: Fall Restrictions Weight Bearing Restrictions: No     Mobility  Bed Mobility Overal bed mobility: Modified Independent             General bed mobility comments: supervision for safety, pt not needing assistance    Transfers Overall transfer level: Needs assistance Equipment used: Rolling walker (2 wheels) Transfers: Sit to/from Stand, Bed to chair/wheelchair/BSC Sit to Stand: Supervision   Step pivot transfers: Supervision       General transfer comment: cues for hand placement with return to sitting, pt with poor  eccentric lower.    Ambulation/Gait Ambulation/Gait assistance: Min guard Gait Distance (Feet): 200 Feet Assistive device: Rolling walker (2 wheels) Gait Pattern/deviations: Step-to pattern, Step-through pattern, Decreased step length - right, Decreased stride length, Decreased weight shift to left, Decreased stance time - right, Trunk flexed, Narrow base of support, Scissoring Gait velocity: decreased Gait velocity interpretation: <1.31 ft/sec, indicative of household ambulator   General Gait Details: cues for posture and wide BOS, pt with intermittent scissoring, especially with turning and narrow BOS for all other steps, especaily with fatigue.      Balance Overall balance assessment: Mild deficits observed, not formally tested Sitting-balance support: Feet supported, Single extremity supported Sitting balance-Leahy Scale: Good     Standing balance support: Bilateral upper extremity supported, During functional activity, Reliant on assistive device for balance Standing balance-Leahy Scale: Fair Standing balance comment: can static stand without UE support, BUE support for gait                            Cognition Arousal/Alertness: Awake/alert Behavior During Therapy: WFL for tasks assessed/performed Overall Cognitive Status: Within Functional Limits for tasks assessed                                 General Comments: Patient A&Ox4; able to follow one step commands consistently throughout session.        Exercises      General Comments General comments (skin integrity, edema, etc.): VSS on RA      Pertinent Vitals/Pain Pain  Assessment Pain Assessment: No/denies pain     PT Goals (current goals can now be found in the care plan section) Acute Rehab PT Goals Patient Stated Goal: Patient would like to return home to family. PT Goal Formulation: With patient/family Time For Goal Achievement: 02/05/23 Potential to Achieve Goals:  Good Progress towards PT goals: Progressing toward goals    Frequency    Min 1X/week      PT Plan Current plan remains appropriate       AM-PAC PT "6 Clicks" Mobility   Outcome Measure  Help needed turning from your back to your side while in a flat bed without using bedrails?: None Help needed moving from lying on your back to sitting on the side of a flat bed without using bedrails?: None Help needed moving to and from a bed to a chair (including a wheelchair)?: None Help needed standing up from a chair using your arms (e.g., wheelchair or bedside chair)?: None Help needed to walk in hospital room?: A Little Help needed climbing 3-5 steps with a railing? : A Little 6 Click Score: 22    End of Session Equipment Utilized During Treatment: Gait belt Activity Tolerance: Patient tolerated treatment well Patient left: in bed;with call bell/phone within reach;with nursing/sitter in room Nurse Communication: Mobility status PT Visit Diagnosis: Muscle weakness (generalized) (M62.81);Difficulty in walking, not elsewhere classified (R26.2)     Time: 6045-4098 PT Time Calculation (min) (ACUTE ONLY): 15 min  Charges:    $Gait Training: 8-22 mins PT General Charges $$ ACUTE PT VISIT: 1 Visit                     Vickki Muff, PT, DPT   Acute Rehabilitation Department Office [phone] Secure Chat Communication Preferred   Ronnie Derby 01/26/2023, 3:22 PM

## 2023-01-26 NOTE — Progress Notes (Signed)
   01/25/23 2315  BiPAP/CPAP/SIPAP  Reason BIPAP/CPAP not in use Non-compliant   Refused

## 2023-01-26 NOTE — Progress Notes (Signed)
Patient ID: [name], male   DOB: [birth year], 72 y.o.   MRN: [id number] S: No new complaints and tolerated HD well yesterday.  O:BP (!) 163/81 (BP Location: Right Arm)   Pulse 79   Temp 97.9 F (36.6 C) (Oral)   Resp 18   Wt 94.9 kg   SpO2 96%   BMI 29.18 kg/m   Intake/Output Summary (Last 24 hours) at 01/26/2023 1022 Last data filed at 01/26/2023 1005 Gross per 24 hour  Intake 915.96 ml  Output 2451 ml  Net -1535.04 ml   Intake/Output: I/O last 3 completed shifts: In: 1050.4 [P.O.:600; I.V.:350.4; IV Piggyback:100] Out: 2151 [Urine:1150; Other:1000; Stool:1]  Intake/Output this shift:  Total I/O In: 384 [P.O.:120; I.V.:264] Out: 300 [Urine:300] Weight change: -0.4 kg Gen:NAD CVS: RRR Resp:CTA Abd: +BS, soft, NT/ND Ext: no edema  Recent Labs  Lab 01/22/23 0606 01/23/23 0025 01/24/23 0059 01/25/23 0040 01/26/23 0043  NA 140 140 140 139 136  K 4.1 4.6 4.5 4.5 3.9  CL 116* 115* 114* 113* 104  CO2 12* 13* 14* 16* 20*  GLUCOSE 45* 121* 125* 156* 128*  BUN 85* 91* 95* 98* 60*  CREATININE 6.07* 6.29* 6.58* 7.05* 5.18*  ALBUMIN 2.7* 2.6* 2.6* 2.5* 2.4*  CALCIUM 7.3* 7.2* 7.2* 7.1* 7.2*  PHOS  --  6.3* 5.7* 5.9* 4.4  AST 20  --   --   --   --   ALT 21  --   --   --   --    Liver Function Tests: Recent Labs  Lab 01/22/23 0606 01/23/23 0025 01/24/23 0059 01/25/23 0040 01/26/23 0043  AST 20  --   --   --   --   ALT 21  --   --   --   --   ALKPHOS 74  --   --   --   --   BILITOT 0.7  --   --   --   --   PROT 6.1*  --   --   --   --   ALBUMIN 2.7*   < > 2.6* 2.5* 2.4*   < > = values in this interval not displayed.   No results for input(s): "LIPASE", "AMYLASE" in the last 168 hours. No results for input(s): "AMMONIA" in the last 168 hours. CBC: Recent Labs  Lab 01/22/23 0608 01/23/23 0025 01/24/23 0059 01/25/23 0040 01/26/23 0043  WBC 9.1 7.2 7.7 8.7 9.3  NEUTROABS 7.0  --   --   --   --   HGB 9.2* 8.8* 9.1* 8.5* 8.3*  HCT 28.3* 26.8* 27.8*  26.2* 24.8*  MCV 93.1 95.0 92.7 92.3 90.2  PLT 209 220 229 222 226   Cardiac Enzymes: No results for input(s): "CKTOTAL", "CKMB", "CKMBINDEX", "TROPONINI" in the last 168 hours. CBG: Recent Labs  Lab 01/25/23 0556 01/25/23 1221 01/25/23 1610 01/25/23 2053 01/26/23 0609  GLUCAP 125* 147* 236* 162* 122*    Iron Studies: No results for input(s): "IRON", "TIBC", "TRANSFERRIN", "FERRITIN" in the last 72 hours. Studies/Results: IR Fluoro Guide CV Line Right  Result Date: 01/24/2023 INDICATION: dialysis catheter placement EXAM: TUNNELED CENTRAL VENOUS HEMODIALYSIS CATHETER PLACEMENT WITH ULTRASOUND AND FLUOROSCOPIC GUIDANCE MEDICATIONS: Ancef 1 gm IV . The antibiotic was given in an appropriate time interval prior to skin puncture. ANESTHESIA/SEDATION: Local anesthetic and single agent sedation was employed during this procedure. A total of Versed 1 mg was administered intravenously. The patient's level of consciousness and vital signs were monitored continuously by  radiology nursing throughout the procedure under my direct supervision. FLUOROSCOPY TIME:  Fluoroscopic dose; 0.1 mGy COMPLICATIONS: None immediate. PROCEDURE: Informed written consent was obtained from the patient and/or patient's representative after a discussion of the risks, benefits, and alternatives to treatment. Questions regarding the procedure were encouraged and answered. The RIGHT neck and chest were prepped with chlorhexidine in a sterile fashion, and a sterile drape was applied covering the operative field. Maximum barrier sterile technique with sterile gowns and gloves were used for the procedure. A timeout was performed prior to the initiation of the procedure. After creating a small venotomy incision, a micropuncture kit was utilized to access the internal jugular vein. Real-time ultrasound guidance was utilized for vascular access including the acquisition of a permanent ultrasound image documenting patency of the accessed  vessel. The microwire was utilized to measure appropriate catheter length. A stiff Glidewire was advanced to the level of the IVC and the micropuncture sheath was exchanged for a peel-away sheath. A palindrome tunneled hemodialysis catheter measuring 19 cm from tip to cuff was tunneled in a retrograde fashion from the anterior chest wall to the venotomy incision. The catheter was then placed through the peel-away sheath with tips ultimately positioned within the superior aspect of the right atrium. Final catheter positioning was confirmed and documented with a spot radiographic image. The catheter aspirates and flushes normally. The catheter was flushed with appropriate volume heparin dwells. The catheter exit site was secured with a 2-0 Ethilon retention suture. The venotomy incision was closed with Dermabond. Dressings were applied. The patient tolerated the procedure well without immediate post procedural complication. IMPRESSION: Successful placement of 19 cm tip to cuff tunneled hemodialysis catheter via the RIGHT internal jugular vein The tip of the catheter is positioned at the superior cavo-atrial junction. The catheter is ready for immediate use. Roanna Banning, MD [facility] Radiology Electronically Signed   By: Roanna Banning M.D.   On: 01/24/2023 16:22   IR US Guide Vasc Access Right  Result Date: 01/24/2023 INDICATION: dialysis catheter placement EXAM: TUNNELED CENTRAL VENOUS HEMODIALYSIS CATHETER PLACEMENT WITH ULTRASOUND AND FLUOROSCOPIC GUIDANCE MEDICATIONS: Ancef 1 gm IV . The antibiotic was given in an appropriate time interval prior to skin puncture. ANESTHESIA/SEDATION: Local anesthetic and single agent sedation was employed during this procedure. A total of Versed 1 mg was administered intravenously. The patient's level of consciousness and vital signs were monitored continuously by radiology nursing throughout the procedure under my direct  supervision. FLUOROSCOPY TIME:  Fluoroscopic dose; 0.1 mGy COMPLICATIONS: None immediate. PROCEDURE: Informed written consent was obtained from the patient and/or patient's representative after a discussion of the risks, benefits, and alternatives to treatment. Questions regarding the procedure were encouraged and answered. The RIGHT neck and chest were prepped with chlorhexidine in a sterile fashion, and a sterile drape was applied covering the operative field. Maximum barrier sterile technique with sterile gowns and gloves were used for the procedure. A timeout was performed prior to the initiation of the procedure. After creating a small venotomy incision, a micropuncture kit was utilized to access the internal jugular vein. Real-time ultrasound guidance was utilized for vascular access including the acquisition of a permanent ultrasound image documenting patency of the accessed vessel. The microwire was utilized to measure appropriate catheter length. A stiff Glidewire was advanced to the level of the IVC and the micropuncture sheath was exchanged for a peel-away sheath. A palindrome tunneled hemodialysis catheter measuring 19 cm from tip to cuff was tunneled in  a retrograde fashion from the anterior chest wall to the venotomy incision. The catheter was then placed through the peel-away sheath with tips ultimately positioned within the superior aspect of the right atrium. Final catheter positioning was confirmed and documented with a spot radiographic image. The catheter aspirates and flushes normally. The catheter was flushed with appropriate volume heparin dwells. The catheter exit site was secured with a 2-0 Ethilon retention suture. The venotomy incision was closed with Dermabond. Dressings were applied. The patient tolerated the procedure well without immediate post procedural complication. IMPRESSION: Successful placement of 19 cm tip to cuff tunneled hemodialysis catheter via the RIGHT internal jugular  vein The tip of the catheter is positioned at the superior cavo-atrial junction. The catheter is ready for immediate use. Roanna Banning, MD [facility] Radiology Electronically Signed   By: Roanna Banning M.D.   On: 01/24/2023 16:22    amLODipine  10 mg Oral Daily   atorvastatin  40 mg Oral Daily   Chlorhexidine Gluconate Cloth  6 each Topical Q0600   darbepoetin (ARANESP) injection - DIALYSIS  60 mcg Subcutaneous Q Sat-1800   feeding supplement  237 mL Oral BID BM   furosemide  80 mg Intravenous Q12H   insulin aspart  0-15 Units Subcutaneous TID WC   insulin aspart  0-5 Units Subcutaneous QHS   levothyroxine  75 mcg Oral QAC breakfast   metoprolol succinate  25 mg Oral Daily   multivitamin with minerals  1 tablet Oral Daily    BMET    Component Value Date/Time   NA 136 01/26/2023 0043   K 3.9 01/26/2023 0043   CL 104 01/26/2023 0043   CO2 20 (L) 01/26/2023 0043   GLUCOSE 128 (H) 01/26/2023 0043   BUN 60 (H) 01/26/2023 0043   CREATININE 5.18 (H) 01/26/2023 0043   CALCIUM 7.2 (L) 01/26/2023 0043   CALCIUM (LL) 09/06/2010 2140    5.7 Result repeated and verified. CRITICAL RESULT CALLED TO, READ BACK BY AND VERIFIED WITH: L FRIESEN,RN 1423 09/07/10 D BRADLEY   GFRNONAA 11 (L) 01/26/2023 0043   GFRAA 56 (L) 08/31/2019 1200   CBC    Component Value Date/Time   WBC 9.3 01/26/2023 0043   RBC 2.75 (L) 01/26/2023 0043   HGB 8.3 (L) 01/26/2023 0043   HCT 24.8 (L) 01/26/2023 0043   PLT 226 01/26/2023 0043   MCV 90.2 01/26/2023 0043   MCH 30.2 01/26/2023 0043   MCHC 33.5 01/26/2023 0043   RDW 12.9 01/26/2023 0043   LYMPHSABS 1.4 01/22/2023 0608   MONOABS 0.6 01/22/2023 0608   EOSABS 0.1 01/22/2023 0608   BASOSABS 0.1 01/22/2023 0608     Assessment/Plan:  AKI/CKD stage IV - presumably due to decompensated CHF vs progressive CKD to stage V.  He has diuresed over the past 2 days, however his BUN/Cr continue to climb.  Given advanced CKD  at baseline, and recent CHF episode, will plan to initiate HD during this admission.  Consult IR for [facility] on 01/24/23 and had first HD session 01/25/23.  Consulted VVS to evaluate for AVF/AVG placement on 01/28/23. Plan for second HD session tomorrow.  CLIP process started.  Avoid nephrotoxic medications including NSAIDs and iodinated intravenous contrast exposure unless the latter is absolutely indicated.  Preferred narcotic agents for pain control are hydromorphone, fentanyl, and methadone. Morphine should not be used. Avoid Baclofen and avoid oral sodium phosphate and magnesium citrate based laxatives / bowel preps. Continue strict Input and Output monitoring.  Will monitor the patient closely with you and intervene or adjust therapy as indicated by changes in clinical status/labs  Acute hypoxic respiratory failure - due to pulmonary edema.  ECHO pending.  Off of BiPAP.  Continue with diuresis and follow.  Bronchodilators per primary svc. Anemia of CKD stage IV - TSAT 20%.  Will start IV iron and ESA. Acute on chronic CHF - diuresing.  ECHO EF 55-60%, Grade I DD. AGMA - due to #1.  Hold off on sodium bicarbonate until volume improves.  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 Randie Heinz.  Appreciate his assistance. Disposition - awaiting outpatient dialysis arrangements to be made.   Irena Cords, MD [facility]

## 2023-01-26 NOTE — Progress Notes (Signed)
  Progress Note    01/26/2023 10:03 AM * No surgery date entered *  Subjective: No overnight issues  Vitals:   01/26/23 0524 01/26/23 0805  BP: (!) 160/87 (!) 163/81  Pulse: 76 79  Resp: 17 18  Temp: 98 F (36.7 C) 97.9 F (36.6 C)  SpO2: 97% 96%    Physical Exam: Awake alert and oriented Bilateral upper extremities have IVs in place Palpable radial pulses bilaterally  CBC    Component Value Date/Time   WBC 9.3 01/26/2023 0043   RBC 2.75 (L) 01/26/2023 0043   HGB 8.3 (L) 01/26/2023 0043   HCT 24.8 (L) 01/26/2023 0043   PLT 226 01/26/2023 0043   MCV 90.2 01/26/2023 0043   MCH 30.2 01/26/2023 0043   MCHC 33.5 01/26/2023 0043   RDW 12.9 01/26/2023 0043   LYMPHSABS 1.4 01/22/2023 0608   MONOABS 0.6 01/22/2023 0608   EOSABS 0.1 01/22/2023 0608   BASOSABS 0.1 01/22/2023 0608    BMET    Component Value Date/Time   NA 136 01/26/2023 0043   K 3.9 01/26/2023 0043   CL 104 01/26/2023 0043   CO2 20 (L) 01/26/2023 0043   GLUCOSE 128 (H) 01/26/2023 0043   BUN 60 (H) 01/26/2023 0043   CREATININE 5.18 (H) 01/26/2023 0043   CALCIUM 7.2 (L) 01/26/2023 0043   CALCIUM (LL) 09/06/2010 2140    5.7 Result repeated and verified. CRITICAL RESULT CALLED TO, READ BACK BY AND VERIFIED WITH: L FRIESEN,RN 1423 09/07/10 D BRADLEY   GFRNONAA 11 (L) 01/26/2023 0043   GFRAA 56 (L) 08/31/2019 1200    INR    Component Value Date/Time   INR 1.3 (H) 01/22/2023 1012     Intake/Output Summary (Last 24 hours) at 01/26/2023 1003 Last data filed at 01/26/2023 0820 Gross per 24 hour  Intake 915.96 ml  Output 2251 ml  Net -1335.04 ml     Assessment/plan:  72 y.o. male is end-stage renal disease in need of permanent dialysis access.  He is on the schedule for Tuesday, July 9 for left arm fistula versus graft.  Continue to hold Eliquis he is okay for heparin drip as needed.  Will need to have IV in left upper extremity removed prior to surgery and restrict blood draws from the left upper  extremity as well.   Felton Buczynski C. Randie Heinz, MD [facility] [place] Office: [phone] Pager: [phone]  01/26/2023 10:03 AM

## 2023-01-26 NOTE — Progress Notes (Signed)
ANTICOAGULATION CONSULT NOTE  Pharmacy Consult for heparin Indication: atrial fibrillation (CVA 6/27)  No Known Allergies  Patient Measurements: Weight: 94.9 kg (209 lb 3.5 oz) Heparin Dosing Weight: 95.7 kg  Vital Signs: Temp: 98 F (36.7 C) (07/07 0524) Temp Source: Oral (07/07 0524) BP: 160/87 (07/07 0524) Pulse Rate: 76 (07/07 0524)  Labs: Recent Labs    01/24/23 0059 01/25/23 0040 01/25/23 1242 01/26/23 0043  HGB 9.1* 8.5*  --  8.3*  HCT 27.8* 26.2*  --  24.8*  PLT 229 222  --  226  APTT 79* 38* >200*  --   HEPARINUNFRC 0.71* 0.25* 0.67 0.34  CREATININE 6.58* 7.05*  --  5.18*     Estimated Creatinine Clearance: 14.7 mL/min (A) (by C-G formula based on SCr of 5.18 mg/dL (H)).   Assessment: Patient is a 72 year old male presenting with SOB. 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 New to HD. HD cath placed 7/5 and first session of dialysis took place the morning of 7/6. Pharmacy consulted to dose heparin.  Apixaban on hold and transitioned to heparin. Last dose of apixaban was 7/2 @ 1900.  Heparin level therapeutic for 0.3-0.5 units/mL goal at 0.34 and heparin infusion running at 1600 units/h.   No issues with bleeding or infusion reported and CBC stable.  Goal of Therapy:  Heparin level 0.3-0.5 units/ml aPTT 66-85 seconds Monitor platelets by anticoagulation protocol: Yes   Plan:  Continue heparin infusion at 1600 units/h Check heparin level daily Continue to monitor CBC and for symptoms of bleeding   Thank you for allowing pharmacy to be a part of this patient's care.   Stephenie Acres, PharmD PGY1 Pharmacy Resident 01/26/2023 7:39 AM  **Pharmacist phone directory can be found on [email or web address] listed under [facility]**

## 2023-01-27 ENCOUNTER — Encounter (HOSPITAL_COMMUNITY): Payer: Self-pay | Admitting: Family Medicine

## 2023-01-27 DIAGNOSIS — N186 End stage renal disease: Secondary | ICD-10-CM | POA: Diagnosis present

## 2023-01-27 DIAGNOSIS — N19 Unspecified kidney failure: Principal | ICD-10-CM | POA: Diagnosis present

## 2023-01-27 LAB — CBC
HCT: 25.4 % — ABNORMAL LOW (ref 39.0–52.0)
HCT: 26.1 % — ABNORMAL LOW (ref 39.0–52.0)
Hemoglobin: 8.5 g/dL — ABNORMAL LOW (ref 13.0–17.0)
Hemoglobin: 8.7 g/dL — ABNORMAL LOW (ref 13.0–17.0)
MCH: 30.6 pg (ref 26.0–34.0)
MCH: 30.3 pg (ref 26.0–34.0)
MCHC: 33.5 g/dL (ref 30.0–36.0)
MCHC: 33.3 g/dL (ref 30.0–36.0)
MCV: 91.4 fL (ref 80.0–100.0)
MCV: 90.9 fL (ref 80.0–100.0)
Platelets: 230 10*3/uL (ref 150–400)
Platelets: 217 10*3/uL (ref 150–400)
RBC: 2.78 MIL/uL — ABNORMAL LOW (ref 4.22–5.81)
RBC: 2.87 MIL/uL — ABNORMAL LOW (ref 4.22–5.81)
RDW: 12.8 % (ref 11.5–15.5)
RDW: 12.8 % (ref 11.5–15.5)
WBC: 9.6 10*3/uL (ref 4.0–10.5)
WBC: 9.7 10*3/uL (ref 4.0–10.5)
nRBC: 0 % (ref 0.0–0.2)
nRBC: 0 % (ref 0.0–0.2)

## 2023-01-27 LAB — GLUCOSE, CAPILLARY
Glucose-Capillary: 101 mg/dL — ABNORMAL HIGH (ref 70–99)
Glucose-Capillary: 109 mg/dL — ABNORMAL HIGH (ref 70–99)
Glucose-Capillary: 157 mg/dL — ABNORMAL HIGH (ref 70–99)
Glucose-Capillary: 175 mg/dL — ABNORMAL HIGH (ref 70–99)
Glucose-Capillary: 155 mg/dL — ABNORMAL HIGH (ref 70–99)
Glucose-Capillary: 230 mg/dL — ABNORMAL HIGH (ref 70–99)

## 2023-01-27 LAB — RENAL FUNCTION PANEL
Albumin: 2.3 g/dL — ABNORMAL LOW (ref 3.5–5.0)
Anion gap: 12 (ref 5–15)
BUN: 69 mg/dL — ABNORMAL HIGH (ref 8–23)
CO2: 20 mmol/L — ABNORMAL LOW (ref 22–32)
Calcium: 7.2 mg/dL — ABNORMAL LOW (ref 8.9–10.3)
Chloride: 104 mmol/L (ref 98–111)
Creatinine, Ser: 6.06 mg/dL — ABNORMAL HIGH (ref 0.61–1.24)
GFR, Estimated: 9 mL/min — ABNORMAL LOW (ref >60)
Glucose, Bld: 115 mg/dL — ABNORMAL HIGH (ref 70–99)
Phosphorus: 5.2 mg/dL — ABNORMAL HIGH (ref 2.5–4.6)
Potassium: 4.4 mmol/L (ref 3.5–5.1)
Sodium: 136 mmol/L (ref 135–145)

## 2023-01-27 LAB — HEPARIN LEVEL (UNFRACTIONATED)
Heparin Unfractionated: 0.27 IU/mL — ABNORMAL LOW (ref 0.30–0.70)
Heparin Unfractionated: 0.21 IU/mL — ABNORMAL LOW (ref 0.30–0.70)
Heparin Unfractionated: 0.4 IU/mL (ref 0.30–0.70)

## 2023-01-27 MED ORDER — HEPARIN SODIUM (PORCINE) 1000 UNIT/ML IJ SOLN
INTRAMUSCULAR | Status: AC
  Administered 2023-01-27: 3200 [IU]
  Filled 2023-01-27: qty 4

## 2023-01-27 MED ORDER — NEPRO/CARBSTEADY PO LIQD: 237.0000 mL | Freq: Two times a day (BID) | ORAL | Status: DC

## 2023-01-27 NOTE — Progress Notes (Signed)
Spoke to pt's daughter, [place], via phone. Introduced self and explained role. Pt resides in [place], [place] and pt/family prefer [facility] for out-pt HD at d/c. Referral submitted [facility] admissions for review. Pt is uninsured and will require financial clearance from [facility] for out-pt services. Pt's family plans to transport pt to/from HD appts at D/C. Will assist as needed.   Olivia Canter Renal Navigator [phone]

## 2023-01-27 NOTE — Progress Notes (Signed)
ANTICOAGULATION CONSULT NOTE  Pharmacy Consult for heparin Indication: atrial fibrillation (CVA 6/27)  No Known Allergies  Patient Measurements: Height: 5\' 11"  (180.3 cm) Weight: 92.8 kg (204 lb 9.4 oz) (bed) IBW/kg (Calculated) : 75.3 Heparin Dosing Weight: 95.7 kg  Vital Signs: Temp: 98.2 F (36.8 C) (07/08 2038) Temp Source: Oral (07/08 2038) BP: 137/94 (07/08 2038) Pulse Rate: 97 (07/08 2038)  Labs: Recent Labs    01/25/23 0040 01/25/23 1242 01/26/23 0043 01/27/23 0053 01/27/23 1036 01/27/23 2052  HGB 8.5*  --  8.3* 8.5* 8.7*  --   HCT 26.2*  --  24.8* 25.4* 26.1*  --   PLT 222  --  226 230 217  --   APTT 38* >200*  --   --   --   --   HEPARINUNFRC 0.25* 0.67 0.34 0.27* 0.21* 0.40  CREATININE 7.05*  --  5.18* 6.06*  --   --      Estimated Creatinine Clearance: 12.4 mL/min (A) (by C-G formula based on SCr of 6.06 mg/dL (H)).   Assessment: Patient is a 72 year old male presenting with SOB. 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 New to HD. HD cath placed 7/5 and first session of dialysis took place the morning of 7/6. Pharmacy consulted to dose heparin.  Apixaban on hold and transitioned to heparin. Last dose of apixaban was 7/2 @ 1900.  01/27/23: Heparin level decreased to 0.21, subtherapeutic despite heparin rate increased to 1700 units/hour. No issues/ interruptions with heparin infusion, peripheral IV site looks good per RN's report.  No bleeding noted and CBC stable with Hgb in 8s and pltc wnl.   Goal HL is 0.3-0.5 units/mL   Heparin level this evening after rate increase is within therapeutic range at 0.4.  No overt bleeding or complications noted.   Goal of Therapy:  Heparin level 0.3-0.5 units/mL  Monitor platelets by anticoagulation protocol: Yes   Plan:  Continue IV heparin at 1850 units/hr. Check heparin level daily Continue to monitor CBC and for symptoms of bleeding   Thank you for allowing pharmacy to be a  part of this patient's care.   Reece Leader, Colon Flattery, BCCP Clinical Pharmacist  01/27/2023 10:01 PM   [facility] pharmacy phone numbers are listed on [email or web address]

## 2023-01-27 NOTE — Progress Notes (Signed)
Received patient in bed to unit.  Alert and oriented.  Informed consent signed and in chart.   TX duration:3  Patient tolerated well.  Transported back to the room  Alert, without acute distress.  Hand-off given to patient's nurse.   Access used: right [facility] Access issues: none  Total UF removed: 2L Medication(s) given: venofer, new bag heparin hung  01/27/23 1627  Vitals  Temp 97.6 F (36.4 C)  Temp Source Oral  BP (!) 119/95  MAP (mmHg) 104  BP Location Right Arm  BP Method Automatic  Patient Position (if appropriate) Lying  Pulse Rate (!) 40  Pulse Rate Source Monitor  ECG Heart Rate (!) 140  Resp 16  Oxygen Therapy  SpO2 96 %  O2 Device Room Air  During Treatment Monitoring  HD Safety Checks Performed Yes  Intra-Hemodialysis Comments Tx completed;Tolerated well  Dialysis Fluid Bolus Normal Saline  Bolus Amount (mL) 300 mL    Evone Arseneau S [facility]

## 2023-01-27 NOTE — Progress Notes (Signed)
ANTICOAGULATION CONSULT NOTE  Pharmacy Consult for heparin Indication: atrial fibrillation (CVA 6/27)  No Known Allergies  Patient Measurements: Height: 5\' 11"  (180.3 cm) Weight: 93.8 kg (206 lb 14.4 oz) IBW/kg (Calculated) : 75.3 Heparin Dosing Weight: 95.7 kg  Vital Signs: Temp: 98.3 F (36.8 C) (07/08 1204) Temp Source: Oral (07/08 1204) BP: 138/97 (07/08 1234) Pulse Rate: 120 (07/08 1204)  Labs: Recent Labs    01/25/23 0040 01/25/23 1242 01/26/23 0043 01/27/23 0053 01/27/23 1036  HGB 8.5*  --  8.3* 8.5* 8.7*  HCT 26.2*  --  24.8* 25.4* 26.1*  PLT 222  --  226 230 217  APTT 38* >200*  --   --   --   HEPARINUNFRC 0.25* 0.67 0.34 0.27* 0.21*  CREATININE 7.05*  --  5.18* 6.06*  --      Estimated Creatinine Clearance: 12.5 mL/min (A) (by C-G formula based on SCr of 6.06 mg/dL (H)).   Assessment: Patient is a 72 year old male presenting with SOB. 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 New to HD. HD cath placed 7/5 and first session of dialysis took place the morning of 7/6. Pharmacy consulted to dose heparin.  Apixaban on hold and transitioned to heparin. Last dose of apixaban was 7/2 @ 1900.  01/27/23: Heparin level decreased to 0.21, subtherapeutic despite heparin rate increased to 1700 units/hour. No issues/ interruptions with heparin infusion, peripheral IV site looks good per RN's report.  No bleeding noted and CBC stable with Hgb in 8s and pltc wnl.   Goal HL is 0.3-0.5 units/mL    Goal of Therapy:  Heparin level 0.3-0.5 units/mL  Monitor platelets by anticoagulation protocol: Yes   Plan:  Increase Heparin infusion to 1850 units/hour.  Check heparin level in 8 hours. Check heparin level daily Continue to monitor CBC and for symptoms of bleeding   Thank you for allowing pharmacy to be a part of this patient's care.   Noah Delaine, RPh Clinical Pharmacist 01/27/2023 12:42 PM  **Pharmacist phone directory can be found on  [email or web address] listed under [facility]**

## 2023-01-27 NOTE — Progress Notes (Addendum)
Patient ID: Delrick Summerville, male   DOB: [birth year], 72 y.o.   MRN: [id number] S: No new complaints and tolerated HD well yesterday. Daughter bedside, lots of good logistical questions.  For AV access surgery tomorrow.   O:BP (!) 164/89 (BP Location: Right Arm)   Pulse 85   Temp 97.6 F (36.4 C) (Oral)   Resp 12   Ht 5\' 11"  (1.803 m)   Wt 93.8 kg   SpO2 94%   BMI 28.86 kg/m   Intake/Output Summary (Last 24 hours) at 01/27/2023 0932 Last data filed at 01/27/2023 0700 Gross per 24 hour  Intake 1020.23 ml  Output 1100 ml  Net -79.77 ml    Intake/Output: I/O last 3 completed shifts: In: 1404.2 [P.O.:720; I.V.:635.8; IV Piggyback:48.4] Out: 1850 [Urine:1850]  Intake/Output this shift:  No intake/output data recorded. Weight change: -0.8 kg Gen:NAD CVS: RRR Resp:CTA Abd: +BS, soft, NT/ND Ext: no edema RIJ tunneled dialysis cath c/d/i  Recent Labs  Lab 01/22/23 0606 01/23/23 0025 01/24/23 0059 01/25/23 0040 01/26/23 0043 01/27/23 0053  NA 140 140 140 139 136 136  K 4.1 4.6 4.5 4.5 3.9 4.4  CL 116* 115* 114* 113* 104 104  CO2 12* 13* 14* 16* 20* 20*  GLUCOSE 45* 121* 125* 156* 128* 115*  BUN 85* 91* 95* 98* 60* 69*  CREATININE 6.07* 6.29* 6.58* 7.05* 5.18* 6.06*  ALBUMIN 2.7* 2.6* 2.6* 2.5* 2.4* 2.3*  CALCIUM 7.3* 7.2* 7.2* 7.1* 7.2* 7.2*  PHOS  --  6.3* 5.7* 5.9* 4.4 5.2*  AST 20  --   --   --   --   --   ALT 21  --   --   --   --   --     Liver Function Tests: Recent Labs  Lab 01/22/23 0606 01/23/23 0025 01/25/23 0040 01/26/23 0043 01/27/23 0053  AST 20  --   --   --   --   ALT 21  --   --   --   --   ALKPHOS 74  --   --   --   --   BILITOT 0.7  --   --   --   --   PROT 6.1*  --   --   --   --   ALBUMIN 2.7*   < > 2.5* 2.4* 2.3*   < > = values in this interval not displayed.    No results for input(s): "LIPASE", "AMYLASE" in the last 168 hours. No results for input(s): "AMMONIA" in the last 168 hours. CBC: Recent Labs  Lab 01/22/23 0608  01/23/23 0025 01/24/23 0059 01/25/23 0040 01/26/23 0043 01/27/23 0053  WBC 9.1 7.2 7.7 8.7 9.3 9.6  NEUTROABS 7.0  --   --   --   --   --   HGB 9.2* 8.8* 9.1* 8.5* 8.3* 8.5*  HCT 28.3* 26.8* 27.8* 26.2* 24.8* 25.4*  MCV 93.1 95.0 92.7 92.3 90.2 91.4  PLT 209 220 229 222 226 230    Cardiac Enzymes: No results for input(s): "CKTOTAL", "CKMB", "CKMBINDEX", "TROPONINI" in the last 168 hours. CBG: Recent Labs  Lab 01/26/23 1055 01/26/23 1605 01/26/23 2111 01/27/23 0612 01/27/23 0813  GLUCAP 240* 140* 101* 109* 157*     Iron Studies: No results for input(s): "IRON", "TIBC", "TRANSFERRIN", "FERRITIN" in the last 72 hours. Studies/Results: No results found.  amLODipine  10 mg Oral Daily   atorvastatin  40 mg Oral Daily   Chlorhexidine Gluconate Cloth  6 each  Topical Q0600   darbepoetin (ARANESP) injection - DIALYSIS  60 mcg Subcutaneous Q Sat-1800   feeding supplement  237 mL Oral BID BM   furosemide  80 mg Intravenous Q12H   insulin aspart  0-15 Units Subcutaneous TID WC   insulin aspart  0-5 Units Subcutaneous QHS   levothyroxine  75 mcg Oral QAC breakfast   metoprolol succinate  25 mg Oral Daily   multivitamin with minerals  1 tablet Oral Daily    BMET    Component Value Date/Time   NA 136 01/27/2023 0053   K 4.4 01/27/2023 0053   CL 104 01/27/2023 0053   CO2 20 (L) 01/27/2023 0053   GLUCOSE 115 (H) 01/27/2023 0053   BUN 69 (H) 01/27/2023 0053   CREATININE 6.06 (H) 01/27/2023 0053   CALCIUM 7.2 (L) 01/27/2023 0053   CALCIUM (LL) 09/06/2010 2140    5.7 Result repeated and verified. CRITICAL RESULT CALLED TO, READ BACK BY AND VERIFIED WITH: L FRIESEN,RN 1423 09/07/10 D BRADLEY   GFRNONAA 9 (L) 01/27/2023 0053   GFRAA 56 (L) 08/31/2019 1200   CBC    Component Value Date/Time   WBC 9.6 01/27/2023 0053   RBC 2.78 (L) 01/27/2023 0053   HGB 8.5 (L) 01/27/2023 0053   HCT 25.4 (L) 01/27/2023 0053   PLT 230 01/27/2023 0053   MCV 91.4 01/27/2023 0053   MCH 30.6  01/27/2023 0053   MCHC 33.5 01/27/2023 0053   RDW 12.8 01/27/2023 0053   LYMPHSABS 1.4 01/22/2023 0608   MONOABS 0.6 01/22/2023 0608   EOSABS 0.1 01/22/2023 0608   BASOSABS 0.1 01/22/2023 0608     Assessment/Plan:  AKI/CKD stage IV - advanced CKD at baseline and no evidence of recovery and given recurrent CHF initiation of HD for ESRD this admission. Consult IR for [facility] on 01/24/23 and had first HD session 01/25/23.  Consulted VVS to evaluate for AVF/AVG placement on 01/28/23. Plan for second HD session today.  CLIP process started.  Avoid nephrotoxic medications including NSAIDs and iodinated intravenous contrast exposure unless the latter is absolutely indicated.  Preferred narcotic agents for pain control are hydromorphone, fentanyl, and methadone. Morphine should not be used. Avoid Baclofen and avoid oral sodium phosphate and magnesium citrate based laxatives / bowel preps. Continue strict Input and Output monitoring. Will monitor the patient closely with you and intervene or adjust therapy as indicated by changes in clinical status/labs  Acute hypoxic respiratory failure - due to pulmonary edema.  ECHO grade 1 DD, normal EF.  improved.  Probe EDW with HD.  Bronchodilators per primary svc. Anemia of CKD stage IV - TSAT 20%.  Started IV iron and ESA. Acute on chronic CHF - diuresing.  ECHO EF 55-60%, Grade I DD. AGMA - improving with HD CKD-BMD - Phos at goal with diet/HD, PTH ordered Vascular access - for left AVF/AVG on 01/28/23 per Dr. Randie Heinz.  Appreciate his assistance. Disposition - awaiting outpatient dialysis arrangements to be made.   Estill Bakes MD [facility] Pager [phone]

## 2023-01-27 NOTE — Progress Notes (Signed)
  Progress Note    01/27/2023 8:09 AM * No surgery date entered *  Subjective:  no complaints   Vitals:   01/27/23 0613 01/27/23 0759  BP: (!) 156/85 (!) 164/89  Pulse: 80 85  Resp: 17 12  Temp: 97.9 F (36.6 C) 97.6 F (36.4 C)  SpO2: 98% 94%   Physical Exam: Lungs:  non labored Extremities:  palpable L radial pulse Neurologic: A&O  CBC    Component Value Date/Time   WBC 9.6 01/27/2023 0053   RBC 2.78 (L) 01/27/2023 0053   HGB 8.5 (L) 01/27/2023 0053   HCT 25.4 (L) 01/27/2023 0053   PLT 230 01/27/2023 0053   MCV 91.4 01/27/2023 0053   MCH 30.6 01/27/2023 0053   MCHC 33.5 01/27/2023 0053   RDW 12.8 01/27/2023 0053   LYMPHSABS 1.4 01/22/2023 0608   MONOABS 0.6 01/22/2023 0608   EOSABS 0.1 01/22/2023 0608   BASOSABS 0.1 01/22/2023 0608    BMET    Component Value Date/Time   NA 136 01/27/2023 0053   K 4.4 01/27/2023 0053   CL 104 01/27/2023 0053   CO2 20 (L) 01/27/2023 0053   GLUCOSE 115 (H) 01/27/2023 0053   BUN 69 (H) 01/27/2023 0053   CREATININE 6.06 (H) 01/27/2023 0053   CALCIUM 7.2 (L) 01/27/2023 0053   CALCIUM (LL) 09/06/2010 2140    5.7 Result repeated and verified. CRITICAL RESULT CALLED TO, READ BACK BY AND VERIFIED WITH: L FRIESEN,RN 1423 09/07/10 D BRADLEY   GFRNONAA 9 (L) 01/27/2023 0053   GFRAA 56 (L) 08/31/2019 1200    INR    Component Value Date/Time   INR 1.3 (H) 01/22/2023 1012     Intake/Output Summary (Last 24 hours) at 01/27/2023 0809 Last data filed at 01/27/2023 0700 Gross per 24 hour  Intake 1140.23 ml  Output 1100 ml  Net 40.23 ml     Assessment/Plan:  72 y.o. male with ESRD on HD  Plan is for left arm AVF creation vs graft placement tomorrow 01/28/23 in the OR with Dr. Karin Lieu.  Surgery was discussed in detail with the patient and family present.  All questions were answered and they are agreeable to proceed.  Continue to hold Eliquis.  Heparin can be held on call to the OR.  NPO past midnight.  Consent ordered. Please move IV  from left arm and restrict L arm from blood draws.   Emilie Rutter, PA-C [facility] [phone] 01/27/2023 8:09 AM

## 2023-01-27 NOTE — Progress Notes (Signed)
PROGRESS NOTE    [name]  ZOX:[id number] DOB: Mar 15, 1951 DOA: 01/22/2023 PCP: System, Provider Not In   Brief Narrative:  72 year old with history of past medical history of HTN, DM2, paroxysmal A-fib on Eliquis, CKD stage IV, recent CVA admitted to [facility] on Friday and diagnosed with CVA with residual left upper and lower extremity weakness. Eventually patient went home and then suddenly developed shortness of breath with change in mental status at home. Patient was found to have elevated BNP, creatinine of 5.96.  Hospitalist called for admission, nephrology team called in consult.  7/5 -temporary dialysis catheter placed without complication 7/6 -tolerated dialysis with only minimal symptoms towards the end of treatment, transient tachycardia resolved with as needed medication 7/8 - repeat HD treatment 7/9 - **Planned fistula creation**  Assessment & Plan:   Active Problems:   Atrial fibrillation (HCC)   T2DM (type 2 diabetes mellitus) (HCC)   Hypertension   H/O: CVA (cerebrovascular accident)   Acute metabolic encephalopathy   Acute renal failure superimposed on stage 4 chronic kidney disease (HCC)   Acute pulmonary edema (HCC)  Acute hypoxic respiratory failure secondary to pulmonary edema, resolved Acute diastolic congestive heart failure.  Stage IV. EF 55% Hypertensive emergency -Blood pressure more appropriately controlled on new regimen -Continues on room air, without hypoxia/symptoms during ambulation -Continue IV Lasix, metoprolol, amlodipine - follow along with Nephrology -EKG without profound ST changes -Troponin minimally elevated at intake - not consistent with ACS   Acute on chronic kidney disease, stage IV -likely advancing to ESRD Metabolic acidosis -Advancing disease in the setting of hypertensive urgency complicated by chronic uncontrolled hypertension and diabetes -Continue to hold home ACE inhibitor -Baseline creatinine around 2.5   -First dialysis completed 7/6 without complication, currently on MWF schedule here -UOP continues while on high dose lasix per nephrology -DC pending clip to outpatient spot -Vascular surgery following -tentatively planned 7/9 fistula creation.  If this cannot be done prior to discharge okay to follow-up outpatient with vascular surgery given dialysis catheter placement currently functional.   Hypertensive emergency, resolved -In the setting of AKI, respiratory distress and hypoxia -complicating his chronic uncontrolled hypertension due to noncompliance -Avoid tight blood pressure control to ensure patient is able to tolerate dialysis/avoid hypotension -Blood pressure improving on amlodipine 10, IV Lasix 80 twice daily, metoprolol succinate 25 -Hoping initiation of dialysis will also improve patient's volume status and blood pressure   History of paroxysmal atrial fibrillation -Continue heparin drip, patient on Eliquis at home, on hold given above   Non-insulin-dependent diabetes type 2, well-controlled -A1c 5.6, continue sliding scale insulin and hypoglycemic protocol while in hospital -Resume glipizide and diabetic diet at discharge   Hypothyroidism -Continue home levothyroxine 75   Recent CVA/ left sided weakness -Start low-dose aspirin and statin  Home med rec does not include aspirin likely in the setting of Eliquis.   DVT prophylaxis: Heparin drip Code Status: Full Family Communication: None present  Status is: Inpatient  Dispo: The patient is from: Home              Anticipated d/c is to: Home              Anticipated d/c date is: 24 to 48 hours              Patient currently not medically stable for discharge  Consultants:  Vascular surgery, nephrology  Procedures:  Temporary dialysis catheter placed 7/5, first hemodialysis session 7/6  Antimicrobials:  None indicated  Subjective:  No acute issues or events overnight denies nausea vomiting diarrhea constipation  headache fevers chills or chest pain.   Objective: Vitals:   01/26/23 1525 01/26/23 1946 01/27/23 0100 01/27/23 0613  BP: (!) 144/86 (!) 162/87 (!) 152/77 (!) 156/85  Pulse: 78 78 70 80  Resp: 18 17 14 17   Temp: 97.7 F (36.5 C) 97.7 F (36.5 C) (!) 97.5 F (36.4 C) 97.9 F (36.6 C)  TempSrc: Oral Oral Oral Oral  SpO2: 98% 97% 99% 98%  Weight:    93.8 kg  Height:        Intake/Output Summary (Last 24 hours) at 01/27/2023 0654 Last data filed at 01/27/2023 0600 Gross per 24 hour  Intake 1267.21 ml  Output 1200 ml  Net 67.21 ml    Filed Weights   01/26/23 0524 01/26/23 1200 01/27/23 0613  Weight: 94.9 kg 94.9 kg 93.8 kg    Examination:  General:  Pleasantly resting in bed, No acute distress. HEENT:  Normocephalic atraumatic.  Sclerae nonicteric, noninjected.  Extraocular movements intact bilaterally. Neck:  Without mass or deformity.  Trachea is midline. Lungs:  Clear to auscultate bilaterally without rhonchi, wheeze, or rales. Heart:  Regular rate and rhythm.  Without murmurs, rubs, or gallops. Abdomen:  Soft, nontender, nondistended.  Without guarding or rebound. Extremities: Without cyanosis, clubbing, edema, or obvious deformity. Skin:  Warm and dry, no erythema. Temporary dialysis catheter clean dry intact right anterior chest wall  Data Reviewed: I have personally reviewed following labs and imaging studies  CBC: Recent Labs  Lab 01/22/23 0608 01/23/23 0025 01/24/23 0059 01/25/23 0040 01/26/23 0043 01/27/23 0053  WBC 9.1 7.2 7.7 8.7 9.3 9.6  NEUTROABS 7.0  --   --   --   --   --   HGB 9.2* 8.8* 9.1* 8.5* 8.3* 8.5*  HCT 28.3* 26.8* 27.8* 26.2* 24.8* 25.4*  MCV 93.1 95.0 92.7 92.3 90.2 91.4  PLT 209 220 229 222 226 230    Basic Metabolic Panel: Recent Labs  Lab 01/22/23 0606 01/23/23 0025 01/24/23 0059 01/25/23 0040 01/26/23 0043 01/27/23 0053  NA 140 140 140 139 136 136  K 4.1 4.6 4.5 4.5 3.9 4.4  CL 116* 115* 114* 113* 104 104  CO2 12* 13* 14*  16* 20* 20*  GLUCOSE 45* 121* 125* 156* 128* 115*  BUN 85* 91* 95* 98* 60* 69*  CREATININE 6.07* 6.29* 6.58* 7.05* 5.18* 6.06*  CALCIUM 7.3* 7.2* 7.2* 7.1* 7.2* 7.2*  MG 1.8 1.8 1.8 1.7 1.6*  --   PHOS  --  6.3* 5.7* 5.9* 4.4 5.2*    GFR: Estimated Creatinine Clearance: 12.5 mL/min (A) (by C-G formula based on SCr of 6.06 mg/dL (H)). Liver Function Tests: Recent Labs  Lab 01/22/23 0606 01/23/23 0025 01/24/23 0059 01/25/23 0040 01/26/23 0043 01/27/23 0053  AST 20  --   --   --   --   --   ALT 21  --   --   --   --   --   ALKPHOS 74  --   --   --   --   --   BILITOT 0.7  --   --   --   --   --   PROT 6.1*  --   --   --   --   --   ALBUMIN 2.7* 2.6* 2.6* 2.5* 2.4* 2.3*    No results for input(s): "LIPASE", "AMYLASE" in the last 168 hours. No results for input(s): "AMMONIA" in the  last 168 hours. Coagulation Profile: Recent Labs  Lab 01/22/23 1012  INR 1.3*    No results for input(s): "HGBA1C" in the last 72 hours.  CBG: Recent Labs  Lab 01/25/23 2053 01/26/23 0609 01/26/23 1055 01/26/23 1605 01/26/23 2111  GLUCAP 162* 122* 240* 140* 101*     Anemia Panel: No results for input(s): "VITAMINB12", "FOLATE", "FERRITIN", "TIBC", "IRON", "RETICCTPCT" in the last 72 hours.  Sepsis Labs: Recent Labs  Lab 01/22/23 0606 01/22/23 0820  LATICACIDVEN 0.7 0.7     Recent Results (from the past 240 hour(s))  MRSA Next Gen by PCR, Nasal     Status: None   Collection Time: 01/26/23  9:59 AM   Specimen: Nasal Mucosa; Nasal Swab  Result Value Ref Range Status   MRSA by PCR Next Gen NOT DETECTED NOT DETECTED Final    Comment: (NOTE) The GeneXpert MRSA Assay (FDA approved for NASAL specimens only), is one component of a comprehensive MRSA colonization surveillance program. It is not intended to diagnose MRSA infection nor to guide or monitor treatment for MRSA infections. Test performance is not FDA approved in patients less than 36 years old. Performed at [facility], [address] [place], [place] [address]          Radiology Studies: No results found.   Scheduled Meds:  amLODipine  10 mg Oral Daily   atorvastatin  40 mg Oral Daily   Chlorhexidine Gluconate Cloth  6 each Topical Q0600   darbepoetin (ARANESP) injection - DIALYSIS  60 mcg Subcutaneous Q Sat-1800   feeding supplement  237 mL Oral BID BM   furosemide  80 mg Intravenous Q12H   insulin aspart  0-15 Units Subcutaneous TID WC   insulin aspart  0-5 Units Subcutaneous QHS   levothyroxine  75 mcg Oral QAC breakfast   metoprolol succinate  25 mg Oral Daily   multivitamin with minerals  1 tablet Oral Daily   Continuous Infusions:  heparin 1,700 Units/hr (01/27/23 0240)   iron sucrose       LOS: 5 days   Time spent: 55 min was spent at bedside discussing case with family, as well as reviewing chart  Azucena Fallen, DO [facility]  If 7PM-7AM, please contact night-coverage [email or web address]  01/27/2023, 6:54 AM

## 2023-01-27 NOTE — Progress Notes (Signed)
ANTICOAGULATION CONSULT NOTE - Follow Up Consult  Pharmacy Consult for heparin Indication: atrial fibrillation in setting of recent stroke  Labs: Recent Labs    01/25/23 0040 01/25/23 1242 01/26/23 0043 01/27/23 0053  HGB 8.5*  --  8.3* 8.5*  HCT 26.2*  --  24.8* 25.4*  PLT 222  --  226 230  APTT 38* >200*  --   --   HEPARINUNFRC 0.25* 0.67 0.34 0.27*  CREATININE 7.05*  --  5.18* 6.06*     Assessment: 72yo male subtherapeutic on heparin after one level at goal on current rate; no infusion issues or signs of bleeding per RN.  Goal of Therapy:  Heparin level 0.3-0.5 units/ml  Plan:  Increase heparin infusion by 1 unit/kg/hr to 1700 units/hr. Check level in 8 hours.   Vernard Gambles, PharmD, BCPS 01/27/2023 2:25 AM

## 2023-01-28 ENCOUNTER — Encounter (HOSPITAL_COMMUNITY): Payer: Self-pay | Admitting: Internal Medicine

## 2023-01-28 ENCOUNTER — Other Ambulatory Visit: Payer: Self-pay

## 2023-01-28 ENCOUNTER — Inpatient Hospital Stay (HOSPITAL_COMMUNITY): Payer: MEDICAID | Admitting: Certified Registered"

## 2023-01-28 ENCOUNTER — Encounter (HOSPITAL_COMMUNITY)
Disposition: A | Discharge status: Home / Self Care | Payer: Self-pay | Source: Other Acute Inpatient Hospital | Attending: Internal Medicine

## 2023-01-28 DIAGNOSIS — N186 End stage renal disease: Secondary | ICD-10-CM

## 2023-01-28 DIAGNOSIS — I4892 Unspecified atrial flutter: Secondary | ICD-10-CM

## 2023-01-28 DIAGNOSIS — I120 Hypertensive chronic kidney disease with stage 5 chronic kidney disease or end stage renal disease: Secondary | ICD-10-CM

## 2023-01-28 DIAGNOSIS — Z992 Dependence on renal dialysis: Secondary | ICD-10-CM

## 2023-01-28 DIAGNOSIS — E1122 Type 2 diabetes mellitus with diabetic chronic kidney disease: Secondary | ICD-10-CM

## 2023-01-28 DIAGNOSIS — N185 Chronic kidney disease, stage 5: Secondary | ICD-10-CM

## 2023-01-28 HISTORY — PX: AV FISTULA PLACEMENT: SHX1204

## 2023-01-28 LAB — CBC
HCT: 26 % — ABNORMAL LOW (ref 39.0–52.0)
Hemoglobin: 8.8 g/dL — ABNORMAL LOW (ref 13.0–17.0)
MCH: 30.9 pg (ref 26.0–34.0)
MCHC: 33.8 g/dL (ref 30.0–36.0)
MCV: 91.2 fL (ref 80.0–100.0)
Platelets: 235 10*3/uL (ref 150–400)
RBC: 2.85 MIL/uL — ABNORMAL LOW (ref 4.22–5.81)
RDW: 12.7 % (ref 11.5–15.5)
WBC: 9.8 10*3/uL (ref 4.0–10.5)
nRBC: 0 % (ref 0.0–0.2)

## 2023-01-28 LAB — RENAL FUNCTION PANEL
Albumin: 2.1 g/dL — ABNORMAL LOW (ref 3.5–5.0)
Anion gap: 16 — ABNORMAL HIGH (ref 5–15)
BUN: 50 mg/dL — ABNORMAL HIGH (ref 8–23)
CO2: 22 mmol/L (ref 22–32)
Calcium: 7.5 mg/dL — ABNORMAL LOW (ref 8.9–10.3)
Chloride: 100 mmol/L (ref 98–111)
Creatinine, Ser: 4.93 mg/dL — ABNORMAL HIGH (ref 0.61–1.24)
GFR, Estimated: 12 mL/min — ABNORMAL LOW (ref >60)
Glucose, Bld: 117 mg/dL — ABNORMAL HIGH (ref 70–99)
Phosphorus: 3.9 mg/dL (ref 2.5–4.6)
Potassium: 3.6 mmol/L (ref 3.5–5.1)
Sodium: 138 mmol/L (ref 135–145)

## 2023-01-28 LAB — GLUCOSE, CAPILLARY
Glucose-Capillary: 145 mg/dL — ABNORMAL HIGH (ref 70–99)
Glucose-Capillary: 135 mg/dL — ABNORMAL HIGH (ref 70–99)
Glucose-Capillary: 147 mg/dL — ABNORMAL HIGH (ref 70–99)
Glucose-Capillary: 163 mg/dL — ABNORMAL HIGH (ref 70–99)
Glucose-Capillary: 155 mg/dL — ABNORMAL HIGH (ref 70–99)
Glucose-Capillary: 195 mg/dL — ABNORMAL HIGH (ref 70–99)

## 2023-01-28 LAB — HEPARIN LEVEL (UNFRACTIONATED): Heparin Unfractionated: 0.25 IU/mL — ABNORMAL LOW (ref 0.30–0.70)

## 2023-01-28 LAB — HEPATITIS B CORE ANTIBODY, TOTAL: Hep B Core Total Ab: NONREACTIVE

## 2023-01-28 SURGERY — ARTERIOVENOUS (AV) FISTULA CREATION
Anesthesia: Monitor Anesthesia Care | Site: Arm Upper | Laterality: Left

## 2023-01-28 MED ORDER — RENA-VITE PO TABS
1.0000 | ORAL_TABLET | Freq: Every day | ORAL | Status: DC
  Administered 2023-01-28 – 2023-02-02 (×6): 1 via ORAL
  Filled 2023-01-28 (×6): qty 1

## 2023-01-28 MED ORDER — CEFAZOLIN SODIUM-DEXTROSE 2-3 GM-%(50ML) IV SOLR
INTRAVENOUS | Status: DC | PRN
  Administered 2023-01-28: 2 g via INTRAVENOUS

## 2023-01-28 MED ORDER — CHLORHEXIDINE GLUCONATE 0.12 % MT SOLN
15.0000 mL | Freq: Once | OROMUCOSAL | Status: AC
  Administered 2023-01-28: 15 mL via OROMUCOSAL
  Filled 2023-01-28: qty 15

## 2023-01-28 MED ORDER — HEPARIN 6000 UNIT IRRIGATION SOLUTION
Status: AC
  Filled 2023-01-28: qty 500

## 2023-01-28 MED ORDER — PROPOFOL 10 MG/ML IV BOLUS
INTRAVENOUS | Status: AC
  Filled 2023-01-28: qty 20

## 2023-01-28 MED ORDER — DILTIAZEM HCL-DEXTROSE 125-5 MG/125ML-% IV SOLN (PREMIX): 5.0000 mg/h | INTRAVENOUS | Status: DC

## 2023-01-28 MED ORDER — OXYCODONE HCL 5 MG PO TABS: 5.0000 mg | ORAL_TABLET | ORAL | Status: DC | PRN

## 2023-01-28 MED ORDER — ONDANSETRON HCL 4 MG/2ML IJ SOLN
INTRAMUSCULAR | Status: AC
  Filled 2023-01-28: qty 2

## 2023-01-28 MED ORDER — AMLODIPINE BESYLATE 10 MG PO TABS: 10.0000 mg | ORAL_TABLET | Freq: Every day | ORAL | Status: DC

## 2023-01-28 MED ORDER — FENTANYL CITRATE (PF) 100 MCG/2ML IJ SOLN: 50.0000 ug | Freq: Once | INTRAMUSCULAR | Status: DC

## 2023-01-28 MED ORDER — BUPIVACAINE-EPINEPHRINE (PF) 0.5% -1:200000 IJ SOLN
INTRAMUSCULAR | Status: DC | PRN
  Administered 2023-01-28: 30 mL via PERINEURAL

## 2023-01-28 MED ORDER — APIXABAN 2.5 MG PO TABS
2.5000 mg | ORAL_TABLET | Freq: Two times a day (BID) | ORAL | Status: DC
  Administered 2023-01-28 – 2023-01-30 (×5): 2.5 mg via ORAL
  Filled 2023-01-28 (×6): qty 1

## 2023-01-28 MED ORDER — FENTANYL CITRATE (PF) 250 MCG/5ML IJ SOLN
INTRAMUSCULAR | Status: AC
  Filled 2023-01-28: qty 5

## 2023-01-28 MED ORDER — DEXAMETHASONE SODIUM PHOSPHATE 10 MG/ML IJ SOLN
INTRAMUSCULAR | Status: AC
  Filled 2023-01-28: qty 1

## 2023-01-28 MED ORDER — LIDOCAINE 2% (20 MG/ML) 5 ML SYRINGE
INTRAMUSCULAR | Status: AC
  Filled 2023-01-28: qty 5

## 2023-01-28 MED ORDER — GLUCERNA SHAKE PO LIQD
237.0000 mL | Freq: Three times a day (TID) | ORAL | Status: DC
  Administered 2023-01-28 – 2023-02-03 (×13): 237 mL via ORAL

## 2023-01-28 MED ORDER — ORAL CARE MOUTH RINSE: 15.0000 mL | Freq: Once | OROMUCOSAL | Status: AC

## 2023-01-28 MED ORDER — LIDOCAINE-EPINEPHRINE (PF) 1 %-1:200000 IJ SOLN
INTRAMUSCULAR | Status: AC
  Filled 2023-01-28: qty 30

## 2023-01-28 MED ORDER — PROPOFOL 1000 MG/100ML IV EMUL
INTRAVENOUS | Status: AC
  Filled 2023-01-28: qty 100

## 2023-01-28 MED ORDER — FENTANYL CITRATE (PF) 100 MCG/2ML IJ SOLN
INTRAMUSCULAR | Status: AC
  Administered 2023-01-28: 50 ug
  Filled 2023-01-28: qty 2

## 2023-01-28 MED ORDER — DILTIAZEM HCL-DEXTROSE 125-5 MG/125ML-% IV SOLN (PREMIX)
5.0000 mg/h | INTRAVENOUS | Status: DC
  Administered 2023-01-28: 5 mg/h via INTRAVENOUS
  Filled 2023-01-28 (×2): qty 125

## 2023-01-28 MED ORDER — HYDROMORPHONE HCL 1 MG/ML IJ SOLN
0.5000 mg | INTRAMUSCULAR | Status: DC | PRN
  Administered 2023-01-28 (×2): 0.5 mg via INTRAVENOUS
  Filled 2023-01-28 (×2): qty 0.5

## 2023-01-28 MED ORDER — 0.9 % SODIUM CHLORIDE (POUR BTL) OPTIME
TOPICAL | Status: DC | PRN
  Administered 2023-01-28: 1000 mL

## 2023-01-28 MED ORDER — ESMOLOL HCL 100 MG/10ML IV SOLN
INTRAVENOUS | Status: DC | PRN
  Administered 2023-01-28 (×3): 20 mg via INTRAVENOUS

## 2023-01-28 MED ORDER — SODIUM CHLORIDE 0.9 % IV SOLN: INTRAVENOUS | Status: DC

## 2023-01-28 MED ORDER — PHENYLEPHRINE 80 MCG/ML (10ML) SYRINGE FOR IV PUSH (FOR BLOOD PRESSURE SUPPORT)
PREFILLED_SYRINGE | INTRAVENOUS | Status: DC | PRN
  Administered 2023-01-28: 160 ug via INTRAVENOUS
  Administered 2023-01-28: 360 ug via INTRAVENOUS
  Administered 2023-01-28 (×2): 240 ug via INTRAVENOUS
  Administered 2023-01-28: 160 ug via INTRAVENOUS

## 2023-01-28 MED ORDER — PHENYLEPHRINE 80 MCG/ML (10ML) SYRINGE FOR IV PUSH (FOR BLOOD PRESSURE SUPPORT)
PREFILLED_SYRINGE | INTRAVENOUS | Status: AC
  Filled 2023-01-28: qty 20

## 2023-01-28 MED ORDER — DILTIAZEM LOAD VIA INFUSION
10.0000 mg | Freq: Once | INTRAVENOUS | Status: DC
  Filled 2023-01-28: qty 10

## 2023-01-28 MED ORDER — METOPROLOL TARTRATE 5 MG/5ML IV SOLN
5.0000 mg | Freq: Once | INTRAVENOUS | Status: AC
  Administered 2023-01-28: 5 mg via INTRAVENOUS

## 2023-01-28 MED ORDER — HEPARIN 6000 UNIT IRRIGATION SOLUTION
Status: DC | PRN
  Administered 2023-01-28: 1

## 2023-01-28 MED ORDER — PROPOFOL 500 MG/50ML IV EMUL
INTRAVENOUS | Status: DC | PRN
  Administered 2023-01-28: 150 ug/kg/min via INTRAVENOUS

## 2023-01-28 SURGICAL SUPPLY — 35 items
ADH SKN CLS APL DERMABOND .7 (GAUZE/BANDAGES/DRESSINGS) ×1
ARMBAND PINK RESTRICT EXTREMIT (MISCELLANEOUS) ×1 IMPLANT
BAG COUNTER SPONGE SURGICOUNT (BAG) ×1 IMPLANT
BAG SPNG CNTER NS LX DISP (BAG) ×1
BLADE CLIPPER SURG (BLADE) ×1 IMPLANT
BNDG ELASTIC 4X5.8 VLCR STR LF (GAUZE/BANDAGES/DRESSINGS) ×1 IMPLANT
CANISTER SUCT 3000ML PPV (MISCELLANEOUS) ×1 IMPLANT
CLIP TI MEDIUM 6 (CLIP) ×2 IMPLANT
CLIP TI WIDE RED SMALL 6 (CLIP) ×1 IMPLANT
COVER PROBE W GEL 5X96 (DRAPES) ×1 IMPLANT
DERMABOND ADVANCED .7 DNX12 (GAUZE/BANDAGES/DRESSINGS) ×1 IMPLANT
ELECT REM PT RETURN 9FT ADLT (ELECTROSURGICAL) ×1
ELECTRODE REM PT RTRN 9FT ADLT (ELECTROSURGICAL) ×1 IMPLANT
GLOVE BIOGEL PI IND STRL 8 (GLOVE) ×1 IMPLANT
GOWN STRL REUS W/ TWL LRG LVL3 (GOWN DISPOSABLE) ×2 IMPLANT
GOWN STRL REUS W/TWL 2XL LVL3 (GOWN DISPOSABLE) ×2 IMPLANT
GOWN STRL REUS W/TWL LRG LVL3 (GOWN DISPOSABLE) ×2
KIT BASIN OR (CUSTOM PROCEDURE TRAY) ×1 IMPLANT
KIT TURNOVER KIT B (KITS) ×1 IMPLANT
NS IRRIG 1000ML POUR BTL (IV SOLUTION) ×1 IMPLANT
PACK CV ACCESS (CUSTOM PROCEDURE TRAY) ×1 IMPLANT
PAD ARMBOARD 7.5X6 YLW CONV (MISCELLANEOUS) ×2 IMPLANT
SLING ARM FOAM STRAP LRG (SOFTGOODS) IMPLANT
SLING ARM FOAM STRAP MED (SOFTGOODS) IMPLANT
SPIKE FLUID TRANSFER (MISCELLANEOUS) ×1 IMPLANT
SUT MNCRL AB 4-0 PS2 18 (SUTURE) ×1 IMPLANT
SUT PROLENE 6 0 BV (SUTURE) ×1 IMPLANT
SUT PROLENE 7 0 BV 1 (SUTURE) IMPLANT
SUT SILK 2 0 SH (SUTURE) IMPLANT
SUT SILK 3 0 SH CR/8 (SUTURE) ×1 IMPLANT
SUT VIC AB 3-0 SH 27 (SUTURE) ×1
SUT VIC AB 3-0 SH 27X BRD (SUTURE) ×1 IMPLANT
TOWEL GREEN STERILE (TOWEL DISPOSABLE) ×1 IMPLANT
UNDERPAD 30X36 HEAVY ABSORB (UNDERPADS AND DIAPERS) ×1 IMPLANT
WATER STERILE IRR 1000ML POUR (IV SOLUTION) ×1 IMPLANT

## 2023-01-28 NOTE — Anesthesia Procedure Notes (Signed)
Anesthesia Regional Block: Supraclavicular block   Pre-Anesthetic Checklist: , timeout performed,  Correct Patient, Correct Site, Correct Laterality,  Correct Procedure, Correct Position, site marked,  Risks and benefits discussed,  Pre-op evaluation,  At surgeon's request and post-op pain management  Laterality: Left  Prep: Maximum Sterile Barrier Precautions used, chloraprep       Needles:  Injection technique: Single-shot  Needle Type: Echogenic Stimulator Needle     Needle Length: 5cm  Needle Gauge: 22     Additional Needles:   Procedures:,,,, ultrasound used (permanent image in chart),,    Narrative:  Start time: 01/28/2023 9:28 AM End time: 01/28/2023 9:38 AM Injection made incrementally with aspirations every 5 mL.  Performed by: Personally  Anesthesiologist: Gaynelle Adu, MD

## 2023-01-28 NOTE — Progress Notes (Signed)
  Progress Note    01/28/2023 9:23 AM Day of Surgery  Subjective:  no complaints   Vitals:   01/28/23 0807 01/28/23 0911  BP: (!) 141/84 (!) 154/89  Pulse: 73 81  Resp: 17 16  Temp: 98.5 F (36.9 C) 98.6 F (37 C)  SpO2: 98% 98%   Physical Exam: Lungs:  non labored Extremities:  palpable L radial pulse Neurologic: A&O  CBC    Component Value Date/Time   WBC 9.8 01/28/2023 0041   RBC 2.85 (L) 01/28/2023 0041   HGB 8.8 (L) 01/28/2023 0041   HCT 26.0 (L) 01/28/2023 0041   PLT 235 01/28/2023 0041   MCV 91.2 01/28/2023 0041   MCH 30.9 01/28/2023 0041   MCHC 33.8 01/28/2023 0041   RDW 12.7 01/28/2023 0041   LYMPHSABS 1.4 01/22/2023 0608   MONOABS 0.6 01/22/2023 0608   EOSABS 0.1 01/22/2023 0608   BASOSABS 0.1 01/22/2023 0608    BMET    Component Value Date/Time   NA 138 01/28/2023 0041   K 3.6 01/28/2023 0041   CL 100 01/28/2023 0041   CO2 22 01/28/2023 0041   GLUCOSE 117 (H) 01/28/2023 0041   BUN 50 (H) 01/28/2023 0041   CREATININE 4.93 (H) 01/28/2023 0041   CALCIUM 7.5 (L) 01/28/2023 0041   CALCIUM (LL) 09/06/2010 2140    5.7 Result repeated and verified. CRITICAL RESULT CALLED TO, READ BACK BY AND VERIFIED WITH: L FRIESEN,RN 1423 09/07/10 D BRADLEY   GFRNONAA 12 (L) 01/28/2023 0041   GFRAA 56 (L) 08/31/2019 1200    INR    Component Value Date/Time   INR 1.3 (H) 01/22/2023 1012     Intake/Output Summary (Last 24 hours) at 01/28/2023 1610 Last data filed at 01/28/2023 0600 Gross per 24 hour  Intake 404.99 ml  Output 2750 ml  Net -2345.01 ml      Assessment/Plan:  72 y.o. male with ESRD on HD  Plan is for left arm AVF creation vs graft placement today.  Surgery was discussed in detail with the patient and family present.  All questions were answered and they are agreeable to proceed.  Continue to hold Eliquis.    Discussed in preop with daughter at bedside. Pt without questions. Felt comfortable proceeding.    Victorino Sparrow MD [facility] [phone] 01/28/2023 9:23 AM

## 2023-01-28 NOTE — Consult Note (Addendum)
Cardiology Consultation   Patient ID: [name] MRN: [id number]; DOB: 04-23-1951  Admit date: 01/22/2023 Date of Consult: 01/28/2023  PCP:  System, Provider Not In   [facility] Providers Cardiologist:  Dr. Sharol Given [facility])  Patient Profile:   [name] is a 72 y.o. male with a hx of pAF, DM, HTN, HLD, CKD stage IV, CVA  who is being seen 01/28/2023 for the evaluation of atrial flutter with RVR at the request of Dr. Karin Lieu.  History of Present Illness:   [name] is a 72 yo male with PMH noted above. He has been followed by Dr. Sharol Given at [facility] for his cardiovascular care.  He was admitted at [facility] 11/2020 with paroxysmal atrial fibrillation and appears he converted to sinus rhythm on diltiazem.  Echocardiogram at that time reported LVEF of 55 to 60%, grade 1 diastolic dysfunction, mild AR. has been maintained on Eliquis for anticoagulation.  He was last seen in the clinic on 07/2021 and reported doing well.  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  Had difficulty walking and was planned to follow-up with PT and OT.  Presented back to [facility] on 7/3 with complaints of shortness of breath and altered mental status.  He was found to have an elevated creatinine of 5.69 (previously 4.63), also noted to be volume overloaded and treated with IV Lasix.  He was transferred to [facility] for further evaluation.  EKG on admission showed sinus rhythm, 72 bpm, septal infarct.  Echocardiogram 7/4 with LVEF of 55 to 60%, no regional wall motion abnormalities, moderate LVH, grade 1 diastolic dysfunction, normal RV size and function, degree of AR not well interrogated, did not seem severe.  Recommendations for consideration of follow-up imaging for aortic root enlargement with CTA or MRA.  Nephrology has been following given his worsening CKD and underwent  temporary dialysis catheter on 7/5 with first HD 7 on 7/6.  VVS was consulted to evaluate for AVF/AVG placement on 7/9.   Underwent successful left AVF with Dr. Karin Lieu, towards the end of the case he developed atrial flutter with RVR. EKG confirmed. He was started on IV diltiazem and titrated to 15mg /hr, no bolus. Blood pressures remained stable. Cardiology asked to evaluate.  In review of telemetry and talking with nurse, he has had several short lived episodes of atrial flutter this admission, one was during his first HD session. These have been responsive to IV metoprolol.   Family at the bedside, he denies any chest pain or shortness of breath. Sore at AVF site. While at the bedside, he did convert back into SR with HR in the 70s.   Past Medical History:  Diagnosis Date   Diabetes mellitus    HLD (hyperlipidemia)    Hypertension    Renal failure 01/27/2023    Past Surgical History:  Procedure Laterality Date   APPENDECTOMY     ruptured appendix 13 years ago did not close wound   HERNIA REPAIR     11 yrs ago    IR FLUORO GUIDE CV LINE RIGHT  01/24/2023   IR US GUIDE VASC ACCESS RIGHT  01/24/2023   OPEN REDUCTION INTERNAL FIXATION (ORIF) DISTAL RADIAL FRACTURE Left 09/03/2019   Procedure: OPEN REDUCTION INTERNAL FIXATION (ORIF) DISTAL RADIAL FRACTURE;  Surgeon: Mack Hook, MD;  Location: [facility];  Service: Orthopedics;  Laterality: Left;  Inpatient Medications: Scheduled Meds:  atorvastatin  40 mg Oral Daily   Chlorhexidine Gluconate Cloth  6 each Topical Q0600   darbepoetin (ARANESP) injection - DIALYSIS  60 mcg Subcutaneous Q Sat-1800   feeding supplement (GLUCERNA SHAKE)  237 mL Oral TID BM   fentaNYL (SUBLIMAZE) injection  50 mcg Intravenous Once   furosemide  80 mg Intravenous Q12H   insulin aspart  0-15 Units Subcutaneous TID WC   insulin aspart  0-5 Units Subcutaneous QHS   levothyroxine  75 mcg Oral QAC breakfast   metoprolol succinate  25 mg Oral  Daily   multivitamin  1 tablet Oral QHS   Continuous Infusions:  heparin Stopped (01/28/23 0855)   iron sucrose Stopped (01/27/23 1636)   PRN Meds: acetaminophen, glucagon (human recombinant), guaiFENesin, hydrALAZINE, ipratropium-albuterol, metoprolol tartrate, ondansetron (ZOFRAN) IV, senna-docusate, traZODone  Allergies:   No Known Allergies  Social History:   Social History   Socioeconomic History   Marital status: Married    Spouse name: Not on file   Number of children: 5   Years of education: Not on file   Highest education level: Not on file  Occupational History    Employer: UNEMPLOYED  Tobacco Use   Smoking status: Never   Smokeless tobacco: Never  Vaping Use   Vaping Use: Never used  Substance and Sexual Activity   Alcohol use: No   Drug use: No   Sexual activity: Not on file  Other Topics Concern   Not on file  Social History Narrative   Not on file   Social Determinants of Health   Financial Resource Strain: Not on file  Food Insecurity: No Food Insecurity (01/22/2023)   Hunger Vital Sign    Worried About Running Out of Food in the Last Year: Never true    Ran Out of Food in the Last Year: Never true  Transportation Needs: No Transportation Needs (01/22/2023)   PRAPARE - Administrator, Civil Service (Medical): No    Lack of Transportation (Non-Medical): No  Physical Activity: Not on file  Stress: Not on file  Social Connections: Not on file  Intimate Partner Violence: Not At Risk (01/22/2023)   Humiliation, Afraid, Rape, and Kick questionnaire    Fear of Current or Ex-Partner: No    Emotionally Abused: No    Physically Abused: No    Sexually Abused: No    Family History:   History reviewed. No pertinent family history.   ROS:  Please see the history of present illness.   All other ROS reviewed and negative.     Physical Exam/Data:   Vitals:   01/28/23 1215 01/28/23 1230 01/28/23 1315 01/28/23 1344  BP: (!) 136/104 137/83 134/85  133/74  Pulse: (!) 138 (!) 141 (!) 137 (!) 56  Resp: 14 (!) 21 18 18   Temp:   98 F (36.7 C) 98.5 F (36.9 C)  TempSrc:    Axillary  SpO2: 94% 93% 96% 96%  Weight:      Height:        Intake/Output Summary (Last 24 hours) at 01/28/2023 1415 Last data filed at 01/28/2023 1055 Gross per 24 hour  Intake 654.99 ml  Output 2752 ml  Net -2097.01 ml      01/28/2023    9:11 AM 01/28/2023    4:01 AM 01/27/2023    4:35 PM  Last 3 Weights  Weight (lbs) 202 lb 1.6 oz 202 lb 1.6 oz 204 lb 9.4 oz  Weight (kg) 91.672  kg 91.672 kg 92.8 kg     Body mass index is 28.19 kg/m.  General:  Well nourished, well developed, in no acute distress. Spanish speaking HEENT: normal Neck: no JVD Vascular: No carotid bruits; Distal pulses 2+ bilaterally Cardiac:  normal S1, S2; RRR; no murmur  Lungs:  clear to auscultation bilaterally, no wheezing, rhonchi or rales  Abd: soft, nontender, no hepatomegaly  Ext: no edema Musculoskeletal:  Left arm in sling Skin: warm and dry  Neuro:  CNs 2-12 intact, no focal abnormalities noted Psych:  Normal affect   EKG:  The EKG was personally reviewed and demonstrates:  Atrial flutter 2:1, 136 bpm   Relevant CV Studies:  Echo: 01/23/2023  IMPRESSIONS     1. Left ventricular ejection fraction, by estimation, is 55 to 60%. The  left ventricle has normal function. The left ventricle has no regional  wall motion abnormalities. There is moderate left ventricular hypertrophy.  Left ventricular diastolic  parameters are consistent with Grade I diastolic dysfunction (impaired  relaxation).   2. Right ventricular systolic function is normal. The right ventricular  size is normal.   3. The mitral valve is normal in structure. No evidence of mitral valve  regurgitation. No evidence of mitral stenosis.   4. Degree of AR not well interrogated No suprasternal notch doppler and  poor color flow but does not seen severe Consider f/u imaging for aortic  root enlargment with CTA  or MRA . The aortic valve is tricuspid. There is  mild calcification of the aortic  valve. There is mild thickening of the aortic valve. Aortic valve  regurgitation is mild. Aortic valve sclerosis is present, with no evidence  of aortic valve stenosis.   5. Aortic dilatation noted. There is severe dilatation of the aortic  root, measuring 48 mm.   6. The inferior vena cava is normal in size with greater than 50%  respiratory variability, suggesting right atrial pressure of 3 mmHg.   FINDINGS   Left Ventricle: Left ventricular ejection fraction, by estimation, is 55  to 60%. The left ventricle has normal function. The left ventricle has no  regional wall motion abnormalities. The left ventricular internal cavity  size was normal in size. There is   moderate left ventricular hypertrophy. Left ventricular diastolic  parameters are consistent with Grade I diastolic dysfunction (impaired  relaxation).   Right Ventricle: The right ventricular size is normal. No increase in  right ventricular wall thickness. Right ventricular systolic function is  normal.   Left Atrium: Left atrial size was normal in size.   Right Atrium: Right atrial size was normal in size.   Pericardium: There is no evidence of pericardial effusion.   Mitral Valve: The mitral valve is normal in structure. No evidence of  mitral valve regurgitation. No evidence of mitral valve stenosis.   Tricuspid Valve: The tricuspid valve is normal in structure. Tricuspid  valve regurgitation is not demonstrated. No evidence of tricuspid  stenosis.   Aortic Valve: Degree of AR not well interrogated No suprasternal notch  doppler and poor color flow but does not seen severe Consider f/u imaging  for aortic root enlargment with CTA or MRA. The aortic valve is tricuspid.  There is mild calcification of the   aortic valve. There is mild thickening of the aortic valve. Aortic valve  regurgitation is mild. Aortic valve sclerosis is  present, with no evidence  of aortic valve stenosis. Aortic valve mean gradient measures 4.0 mmHg.  Aortic valve peak  gradient  measures 7.2 mmHg. Aortic valve area, by VTI measures 3.68 cm.   Pulmonic Valve: The pulmonic valve was normal in structure. Pulmonic valve  regurgitation is not visualized. No evidence of pulmonic stenosis.   Aorta: Aortic dilatation noted. There is severe dilatation of the aortic  root, measuring 48 mm.   Venous: The inferior vena cava is normal in size with greater than 50%  respiratory variability, suggesting right atrial pressure of 3 mmHg.   IAS/Shunts: No atrial level shunt detected by color flow Doppler.       Laboratory Data:  High Sensitivity Troponin:   Recent Labs  Lab 01/22/23 0608 01/22/23 0820  TROPONINIHS 35* 40*     Chemistry Recent Labs  Lab 01/24/23 0059 01/25/23 0040 01/26/23 0043 01/27/23 0053 01/28/23 0041  NA 140 139 136 136 138  K 4.5 4.5 3.9 4.4 3.6  CL 114* 113* 104 104 100  CO2 14* 16* 20* 20* 22  GLUCOSE 125* 156* 128* 115* 117*  BUN 95* 98* 60* 69* 50*  CREATININE 6.58* 7.05* 5.18* 6.06* 4.93*  CALCIUM 7.2* 7.1* 7.2* 7.2* 7.5*  MG 1.8 1.7 1.6*  --   --   GFRNONAA 8* 8* 11* 9* 12*  ANIONGAP 12 10 12 12  16*    Recent Labs  Lab 01/22/23 0606 01/23/23 0025 01/26/23 0043 01/27/23 0053 01/28/23 0041  PROT 6.1*  --   --   --   --   ALBUMIN 2.7*   < > 2.4* 2.3* 2.1*  AST 20  --   --   --   --   ALT 21  --   --   --   --   ALKPHOS 74  --   --   --   --   BILITOT 0.7  --   --   --   --    < > = values in this interval not displayed.   Lipids No results for input(s): "CHOL", "TRIG", "HDL", "LABVLDL", "LDLCALC", "CHOLHDL" in the last 168 hours.  Hematology Recent Labs  Lab 01/27/23 0053 01/27/23 1036 01/28/23 0041  WBC 9.6 9.7 9.8  RBC 2.78* 2.87* 2.85*  HGB 8.5* 8.7* 8.8*  HCT 25.4* 26.1* 26.0*  MCV 91.4 90.9 91.2  MCH 30.6 30.3 30.9  MCHC 33.5 33.3 33.8  RDW 12.8 12.8 12.7  PLT 230 217 235    Thyroid No results for input(s): "TSH", "FREET4" in the last 168 hours.  BNP Recent Labs  Lab 01/22/23 0606  BNP 1,135.7*    DDimer No results for input(s): "DDIMER" in the last 168 hours.   Radiology/Studies:  IR Fluoro Guide CV Line Right  Result Date: 01/24/2023 INDICATION: dialysis catheter placement EXAM: TUNNELED CENTRAL VENOUS HEMODIALYSIS CATHETER PLACEMENT WITH ULTRASOUND AND FLUOROSCOPIC GUIDANCE MEDICATIONS: Ancef 1 gm IV . The antibiotic was given in an appropriate time interval prior to skin puncture. ANESTHESIA/SEDATION: Local anesthetic and single agent sedation was employed during this procedure. A total of Versed 1 mg was administered intravenously. The patient's level of consciousness and vital signs were monitored continuously by radiology nursing throughout the procedure under my direct supervision. FLUOROSCOPY TIME:  Fluoroscopic dose; 0.1 mGy COMPLICATIONS: None immediate. PROCEDURE: Informed written consent was obtained from the patient and/or patient's representative after a discussion of the risks, benefits, and alternatives to treatment. Questions regarding the procedure were encouraged and answered. The RIGHT neck and chest were prepped with chlorhexidine in a sterile fashion, and a sterile drape was applied covering the operative field.  Maximum barrier sterile technique with sterile gowns and gloves were used for the procedure. A timeout was performed prior to the initiation of the procedure. After creating a small venotomy incision, a micropuncture kit was utilized to access the internal jugular vein. Real-time ultrasound guidance was utilized for vascular access including the acquisition of a permanent ultrasound image documenting patency of the accessed vessel. The microwire was utilized to measure appropriate catheter length. A stiff Glidewire was advanced to the level of the IVC and the micropuncture sheath was exchanged for a peel-away sheath. A palindrome tunneled  hemodialysis catheter measuring 19 cm from tip to cuff was tunneled in a retrograde fashion from the anterior chest wall to the venotomy incision. The catheter was then placed through the peel-away sheath with tips ultimately positioned within the superior aspect of the right atrium. Final catheter positioning was confirmed and documented with a spot radiographic image. The catheter aspirates and flushes normally. The catheter was flushed with appropriate volume heparin dwells. The catheter exit site was secured with a 2-0 Ethilon retention suture. The venotomy incision was closed with Dermabond. Dressings were applied. The patient tolerated the procedure well without immediate post procedural complication. IMPRESSION: Successful placement of 19 cm tip to cuff tunneled hemodialysis catheter via the RIGHT internal jugular vein The tip of the catheter is positioned at the superior cavo-atrial junction. The catheter is ready for immediate use. Roanna Banning, MD [facility] Electronically Signed   By: Roanna Banning M.D.   On: 01/24/2023 16:22   IR US Guide Vasc Access Right  Result Date: 01/24/2023 INDICATION: dialysis catheter placement EXAM: TUNNELED CENTRAL VENOUS HEMODIALYSIS CATHETER PLACEMENT WITH ULTRASOUND AND FLUOROSCOPIC GUIDANCE MEDICATIONS: Ancef 1 gm IV . The antibiotic was given in an appropriate time interval prior to skin puncture. ANESTHESIA/SEDATION: Local anesthetic and single agent sedation was employed during this procedure. A total of Versed 1 mg was administered intravenously. The patient's level of consciousness and vital signs were monitored continuously by radiology nursing throughout the procedure under my direct supervision. FLUOROSCOPY TIME:  Fluoroscopic dose; 0.1 mGy COMPLICATIONS: None immediate. PROCEDURE: Informed written consent was obtained from the patient and/or patient's representative after a discussion of the risks,  benefits, and alternatives to treatment. Questions regarding the procedure were encouraged and answered. The RIGHT neck and chest were prepped with chlorhexidine in a sterile fashion, and a sterile drape was applied covering the operative field. Maximum barrier sterile technique with sterile gowns and gloves were used for the procedure. A timeout was performed prior to the initiation of the procedure. After creating a small venotomy incision, a micropuncture kit was utilized to access the internal jugular vein. Real-time ultrasound guidance was utilized for vascular access including the acquisition of a permanent ultrasound image documenting patency of the accessed vessel. The microwire was utilized to measure appropriate catheter length. A stiff Glidewire was advanced to the level of the IVC and the micropuncture sheath was exchanged for a peel-away sheath. A palindrome tunneled hemodialysis catheter measuring 19 cm from tip to cuff was tunneled in a retrograde fashion from the anterior chest wall to the venotomy incision. The catheter was then placed through the peel-away sheath with tips ultimately positioned within the superior aspect of the right atrium. Final catheter positioning was confirmed and documented with a spot radiographic image. The catheter aspirates and flushes normally. The catheter was flushed with appropriate volume heparin dwells. The catheter exit site was secured with a 2-0 Ethilon retention suture. The  venotomy incision was closed with Dermabond. Dressings were applied. The patient tolerated the procedure well without immediate post procedural complication. IMPRESSION: Successful placement of 19 cm tip to cuff tunneled hemodialysis catheter via the RIGHT internal jugular vein The tip of the catheter is positioned at the superior cavo-atrial junction. The catheter is ready for immediate use. Roanna Banning, MD [facility] Electronically  Signed   By: Roanna Banning M.D.   On: 01/24/2023 16:22     Assessment and Plan:   [name] is a 72 y.o. male with a hx of pAF, DM, HTN, HLD, CKD stage IV, CVA  who is being seen 01/28/2023 for the evaluation of atrial flutter with RVR at the request of Dr. Karin Lieu.  Atrial flutter with RVR Hx of atrial fibrillation -- has had a couple of episodes this admission, responsive to IV metoprolol. Developed recurrent episode today while in the OR with AVF placement.  -- received IV Diltiazem, up to 15mg /hr, converted to SR while at the bedside, has not had any issues with hypotension with HD. Need to be cautious with meds as he is a new HD start. Will continue IV Diltiazem for now, consider transition to PO later today -- his PTA Eliquis has been held with the need for surgery, resume once ok from VVS standpoint  CKD stage IV -- presented with worsening Cr and confusion -- had TDC placement and now s/p AVF placement today, undergoing HD -- management per nephrology  HTN -- blood pressures were initially elevated on admission -- with the need for IV diltiazem, stop norvasc -- allowing higher BPs to ensure he is able to tolerate HD  Per Primary Recent CVA Hypothyroidism, recent TSH 4.4 DM  Risk Assessment/Risk Scores:   CHA2DS2-VASc Score = 6  This indicates a 9.7% annual risk of stroke. The patient's score is based upon: CHF History: 1 HTN History: 1 Diabetes History: 1 Stroke History: 2 Vascular Disease History: 0 Age Score: 1 Gender Score: 0    For questions or updates, please contact [facility] Please consult [email or web address] for contact info under    Signed, Laverda Page, NP  01/28/2023 2:15 PM  Patient seen and examined with Laverda Page NP.  Agree as above, with the following exceptions and changes as noted below. Pt with history of PAF, noted to have recurrent atrial arrhythmia at the time of [facility] placement. Diltiazem gtt initiated and he has  subsequently converted back to sinus rhythm. Gen: NAD, CV: RRR, no murmurs, Lungs: clear, Abd: soft, Extrem: Warm, well perfused, no edema, Neuro/Psych: alert and oriented x 3, normal mood and affect. All available labs, radiology testing, previous records reviewed. Ok to stop diltiazem drip and can continue on oral diltiazem. Can resume anticoagulation per pharmacy for dosing. Can follow up with his home cardiologist when dismissed.   Cardiology will sign off at this time.   Parke Poisson, MD 01/28/23 5:01 PM

## 2023-01-28 NOTE — Anesthesia Procedure Notes (Signed)
Procedure Name: MAC Date/Time: 01/28/2023 9:50 AM  Performed by: De Nurse, CRNAPre-anesthesia Checklist: Patient identified, Emergency Drugs available, Suction available, Patient being monitored and Timeout performed Patient Re-evaluated:Patient Re-evaluated prior to induction Oxygen Delivery Method: Simple face mask

## 2023-01-28 NOTE — Progress Notes (Signed)
Contacted [facility] admissions to be advised that Hep B total core antibody was faxed to admissions this morning for review. Pt's case pending. Will assist as needed.   Olivia Canter Renal Navigator [phone]

## 2023-01-28 NOTE — Discharge Instructions (Addendum)
[facility]  Discharge Instructions  AV Fistula or Graft Surgery for Dialysis Access  Please refer to the following instructions for your post-procedure care. Your surgeon or physician assistant will discuss any changes with you.  Activity  You may drive the day following your surgery, if you are comfortable and no longer taking prescription pain medication. Resume full activity as the soreness in your incision resolves.  Bathing/Showering  You may shower after you go home. Keep your incision dry for 48 hours. Do not soak in a bathtub, hot tub, or swim until the incision heals completely. You may not shower if you have a hemodialysis catheter.  Incision Care  Clean your incision with mild soap and water after 48 hours. Pat the area dry with a clean towel. You do not need a bandage unless otherwise instructed. Do not apply any ointments or creams to your incision. You may have skin glue on your incision. Do not peel it off. It will come off on its own in about one week. Your arm may swell a bit after surgery. To reduce swelling use pillows to elevate your arm so it is above your heart. Your doctor will tell you if you need to lightly wrap your arm with an ACE bandage.  Diet  Resume your normal diet. There are not special food restrictions following this procedure. In order to heal from your surgery, it is CRITICAL to get adequate nutrition. Your body requires vitamins, minerals, and protein. Vegetables are the best source of vitamins and minerals. Vegetables also provide the perfect balance of protein. Processed food has little nutritional value, so try to avoid this.  Medications  Resume taking all of your medications. If your incision is causing pain, you may take over-the counter pain relievers such as acetaminophen (Tylenol). If you were prescribed a stronger pain medication, please be aware these medications can cause nausea and constipation. Prevent  nausea by taking the medication with a snack or meal. Avoid constipation by drinking plenty of fluids and eating foods with high amount of fiber, such as fruits, vegetables, and grains.  Do not take Tylenol if you are taking prescription pain medications.  Follow up Your surgeon may want to see you in the office following your access surgery. If so, this will be arranged at the time of your surgery.  Please call us immediately for any of the following conditions:  Increased pain, redness, drainage (pus) from your incision site Fever of 101 degrees or higher Severe or worsening pain at your incision site Hand pain or numbness.  Reduce your risk of vascular disease:  Stop smoking. If you would like help, call [facility] at 1-800-QUIT-NOW (712-022-5964) or [place] at 713-622-0327  Manage your cholesterol Maintain a desired weight Control your diabetes Keep your blood pressure down  Dialysis  It will take several weeks to several months for your new dialysis access to be ready for use. Your surgeon will determine when it is okay to use it. Your nephrologist will continue to direct your dialysis. You can continue to use your Permcath until your new access is ready for use.   01/28/2023 [name] [id number] 07/07/1951  Surgeon(s): Victorino Sparrow, MD  Procedure(s): LEFT ARM BRACHIO BASILIC ARTERIOVENOUS (AV) FISTULA CREATION   May stick graft immediately   May stick graft on designated area only:   X Do not stick left AV fistula for 12 weeks    If you have any questions, please call  the office at [phone].

## 2023-01-28 NOTE — Anesthesia Preprocedure Evaluation (Addendum)
Anesthesia Evaluation  Patient identified by MRN, date of birth, ID band Patient awake    Reviewed: Allergy & Precautions, H&P , NPO status , Patient's Chart, lab work & pertinent test results  Airway Mallampati: II  TM Distance: >3 FB Neck ROM: Full    Dental no notable dental hx. (+) Edentulous Upper, Edentulous Lower, Dental Advisory Given   Pulmonary neg pulmonary ROS   Pulmonary exam normal breath sounds clear to auscultation       Cardiovascular hypertension, Pt. on medications and Pt. on home beta blockers + DOE  + dysrhythmias Atrial Fibrillation  Rhythm:Regular Rate:Normal     Neuro/Psych negative neurological ROS  negative psych ROS   GI/Hepatic negative GI ROS, Neg liver ROS,,,  Endo/Other  diabetes, Type 2, Oral Hypoglycemic Agents    Renal/GU ESRF and DialysisRenal disease  negative genitourinary   Musculoskeletal   Abdominal   Peds  Hematology negative hematology ROS (+)   Anesthesia Other Findings   Reproductive/Obstetrics negative OB ROS                             Anesthesia Physical Anesthesia Plan  ASA: 3  Anesthesia Plan: MAC and Regional   Post-op Pain Management:    Induction: Intravenous  PONV Risk Score and Plan: 1 and Propofol infusion  Airway Management Planned: Simple Face Mask  Additional Equipment:   Intra-op Plan:   Post-operative Plan:   Informed Consent: I have reviewed the patients History and Physical, chart, labs and discussed the procedure including the risks, benefits and alternatives for the proposed anesthesia with the patient or authorized representative who has indicated his/her understanding and acceptance.     Dental advisory given and Interpreter used for interveiw  Plan Discussed with: CRNA  Anesthesia Plan Comments:        Anesthesia Quick Evaluation

## 2023-01-28 NOTE — Progress Notes (Signed)
Patient ID: [name], male   DOB: September 26, 1950, 72 y.o.   MRN: [id number] S: No new complaints and tolerated HD well yesterday.  For AV access surgery today.  Daughter mentions A fib yesterday.    O:BP 109/76 (BP Location: Right Leg)   Pulse (!) 139   Temp 98 F (36.7 C)   Resp 16   Ht 5\' 11"  (1.803 m)   Wt 91.7 kg   SpO2 95%   BMI 28.19 kg/m   Intake/Output Summary (Last 24 hours) at 01/28/2023 1128 Last data filed at 01/28/2023 1055 Gross per 24 hour  Intake 654.99 ml  Output 2752 ml  Net -2097.01 ml    Intake/Output: I/O last 3 completed shifts: In: 1081.3 [P.O.:480; I.V.:601.3] Out: 3250 [Urine:1250; Other:2000]  Intake/Output this shift:  Total I/O In: 250 [I.V.:250] Out: 2 [Blood:2] Weight change: 0 kg Gen:NAD CVS: RRR Resp: CTAB Ext: no edema RIJ tunneled dialysis cath c/d/i  Recent Labs  Lab 01/22/23 0606 01/23/23 0025 01/24/23 0059 01/25/23 0040 01/26/23 0043 01/27/23 0053 01/28/23 0041  NA 140 140 140 139 136 136 138  K 4.1 4.6 4.5 4.5 3.9 4.4 3.6  CL 116* 115* 114* 113* 104 104 100  CO2 12* 13* 14* 16* 20* 20* 22  GLUCOSE 45* 121* 125* 156* 128* 115* 117*  BUN 85* 91* 95* 98* 60* 69* 50*  CREATININE 6.07* 6.29* 6.58* 7.05* 5.18* 6.06* 4.93*  ALBUMIN 2.7* 2.6* 2.6* 2.5* 2.4* 2.3* 2.1*  CALCIUM 7.3* 7.2* 7.2* 7.1* 7.2* 7.2* 7.5*  PHOS  --  6.3* 5.7* 5.9* 4.4 5.2* 3.9  AST 20  --   --   --   --   --   --   ALT 21  --   --   --   --   --   --     Liver Function Tests: Recent Labs  Lab 01/22/23 0606 01/23/23 0025 01/26/23 0043 01/27/23 0053 01/28/23 0041  AST 20  --   --   --   --   ALT 21  --   --   --   --   ALKPHOS 74  --   --   --   --   BILITOT 0.7  --   --   --   --   PROT 6.1*  --   --   --   --   ALBUMIN 2.7*   < > 2.4* 2.3* 2.1*   < > = values in this interval not displayed.    No results for input(s): "LIPASE", "AMYLASE" in the last 168 hours. No results for input(s): "AMMONIA" in the last 168 hours. CBC: Recent Labs   Lab 01/22/23 0608 01/23/23 0025 01/25/23 0040 01/26/23 0043 01/27/23 0053 01/27/23 1036 01/28/23 0041  WBC 9.1   < > 8.7 9.3 9.6 9.7 9.8  NEUTROABS 7.0  --   --   --   --   --   --   HGB 9.2*   < > 8.5* 8.3* 8.5* 8.7* 8.8*  HCT 28.3*   < > 26.2* 24.8* 25.4* 26.1* 26.0*  MCV 93.1   < > 92.3 90.2 91.4 90.9 91.2  PLT 209   < > 222 226 230 217 235   < > = values in this interval not displayed.    Cardiac Enzymes: No results for input(s): "CKTOTAL", "CKMB", "CKMBINDEX", "TROPONINI" in the last 168 hours. CBG: Recent Labs  Lab 01/27/23 1727 01/27/23 2045 01/28/23 0624 01/28/23 0854 01/28/23 1106  GLUCAP 155* 230* 145* 135* 147*     Iron Studies: No results for input(s): "IRON", "TIBC", "TRANSFERRIN", "FERRITIN" in the last 72 hours. Studies/Results: No results found.  [MAR Hold] amLODipine  10 mg Oral Daily   [MAR Hold] atorvastatin  40 mg Oral Daily   [MAR Hold] Chlorhexidine Gluconate Cloth  6 each Topical Q0600   [MAR Hold] darbepoetin (ARANESP) injection - DIALYSIS  60 mcg Subcutaneous Q Sat-1800   [MAR Hold] feeding supplement (NEPRO CARB STEADY)  237 mL Oral BID BM   fentaNYL (SUBLIMAZE) injection  50 mcg Intravenous Once   [MAR Hold] furosemide  80 mg Intravenous Q12H   [MAR Hold] insulin aspart  0-15 Units Subcutaneous TID WC   [MAR Hold] insulin aspart  0-5 Units Subcutaneous QHS   [MAR Hold] levothyroxine  75 mcg Oral QAC breakfast   [MAR Hold] metoprolol succinate  25 mg Oral Daily   [MAR Hold] multivitamin  1 tablet Oral QHS    BMET    Component Value Date/Time   NA 138 01/28/2023 0041   K 3.6 01/28/2023 0041   CL 100 01/28/2023 0041   CO2 22 01/28/2023 0041   GLUCOSE 117 (H) 01/28/2023 0041   BUN 50 (H) 01/28/2023 0041   CREATININE 4.93 (H) 01/28/2023 0041   CALCIUM 7.5 (L) 01/28/2023 0041   CALCIUM (LL) 09/06/2010 2140    5.7 Result repeated and verified. CRITICAL RESULT CALLED TO, READ BACK BY AND VERIFIED WITH: L FRIESEN,RN 1423 09/07/10 D  BRADLEY   GFRNONAA 12 (L) 01/28/2023 0041   GFRAA 56 (L) 08/31/2019 1200   CBC    Component Value Date/Time   WBC 9.8 01/28/2023 0041   RBC 2.85 (L) 01/28/2023 0041   HGB 8.8 (L) 01/28/2023 0041   HCT 26.0 (L) 01/28/2023 0041   PLT 235 01/28/2023 0041   MCV 91.2 01/28/2023 0041   MCH 30.9 01/28/2023 0041   MCHC 33.8 01/28/2023 0041   RDW 12.7 01/28/2023 0041   LYMPHSABS 1.4 01/22/2023 0608   MONOABS 0.6 01/22/2023 0608   EOSABS 0.1 01/22/2023 0608   BASOSABS 0.1 01/22/2023 0608     Assessment/Plan:  AKI/CKD stage IV - advanced CKD at baseline and no evidence of recovery and given recurrent CHF initiation of HD for ESRD this admission. Consult IR for [facility] on 01/24/23 and had first HD session 01/25/23.  Consulted VVS to evaluate for AVF/AVG placement on 01/28/23. Plan for third HD session tomorrow.  CLIP process started.  Avoid nephrotoxic medications including NSAIDs and iodinated intravenous contrast exposure unless the latter is absolutely indicated.  Preferred narcotic agents for pain control are hydromorphone, fentanyl, and methadone. Morphine should not be used. Avoid Baclofen and avoid oral sodium phosphate and magnesium citrate based laxatives / bowel preps. Continue strict Input and Output monitoring. Will monitor the patient closely with you and intervene or adjust therapy as indicated by changes in clinical status/labs  Acute hypoxic respiratory failure - due to pulmonary edema.  ECHO grade 1 DD, normal EF.  improved.  Probe EDW with HD.  Bronchodilators per primary svc. Anemia of CKD stage IV - TSAT 20%.  Started IV iron and ESA. Acute on chronic CHF - diuresing/UF with HD, approaching euvolemia.  ECHO EF 55-60%, Grade I DD. CKD-BMD - Phos at goal with diet/HD, PTH ordered Vascular access - for left AVF/AVG on 01/28/23 per Dr. Karin Lieu.  Appreciate his assistance. A fib - per primary Disposition - awaiting outpatient dialysis arrangements to be made.  Estill Bakes MD [facility] Pager [phone]

## 2023-01-28 NOTE — Progress Notes (Signed)
PROGRESS NOTE    [name]  QIO:[id number] DOB: 06-Apr-1951 DOA: 01/22/2023 PCP: System, Provider Not In   Brief Narrative:  72 year old with history of past medical history of HTN, DM2, paroxysmal A-fib on Eliquis, CKD stage IV, recent CVA admitted to [facility] on Friday and diagnosed with CVA with residual left upper and lower extremity weakness. Eventually patient went home and then suddenly developed shortness of breath with change in mental status at home. Patient was found to have elevated BNP, creatinine of 5.96.  Hospitalist called for admission, nephrology team called in consult.  7/5 -temporary dialysis catheter placed without complication 7/6 -tolerated dialysis with only minimal symptoms towards the end of treatment, transient tachycardia resolved with as needed medication 7/8 - repeat HD treatment 7/9 -fistula creation per vascular surgery later today  Assessment & Plan:   Principal Problem:   Renal failure Active Problems:   Atrial fibrillation (HCC)   T2DM (type 2 diabetes mellitus) (HCC)   Hypertension   H/O: CVA (cerebrovascular accident)   Acute metabolic encephalopathy   Acute renal failure superimposed on stage 4 chronic kidney disease (HCC)   Acute pulmonary edema (HCC)  Acute hypoxic respiratory failure secondary to pulmonary edema, resolved Acute diastolic congestive heart failure.  Stage IV. EF 55% Hypertensive emergency -Blood pressure more appropriately controlled on new regimen -Continues on room air, without hypoxia/symptoms during ambulation -Continue IV Lasix, metoprolol succinate - follow along with Nephrology -EKG without profound ST changes -Troponin minimally elevated at intake - not consistent with ACS   Acute on chronic kidney disease, stage IV -likely advancing to ESRD Metabolic acidosis -Advancing disease in the setting of hypertensive urgency complicated by chronic uncontrolled hypertension and diabetes -Continue to hold  home ACE inhibitor -Baseline creatinine around 2.5  -First dialysis completed 7/6 without complication, currently on MWF schedule here -UOP continues while on high dose lasix per nephrology -DC pending clip to outpatient spot -Vascular surgery following -tentatively planned 7/9 fistula creation.  If this cannot be done prior to discharge okay to follow-up outpatient with vascular surgery given dialysis catheter placement currently functional.   Hypertensive emergency, resolved -In the setting of AKI, respiratory distress and hypoxia -complicating his chronic uncontrolled hypertension due to noncompliance -Avoid strict blood pressure control to ensure patient is able to tolerate dialysis/avoid hypotension -Blood pressure improving with IV Lasix 80 twice daily, metoprolol succinate 25 -Blood pressure continues to improve with dialysis.   History of paroxysmal atrial fibrillation -Continue heparin drip, patient on Eliquis at home, on hold given above   Non-insulin-dependent diabetes type 2, well-controlled -A1c 5.6, continue sliding scale insulin and hypoglycemic protocol while in hospital -Resume glipizide and diabetic diet at discharge   Hypothyroidism -Continue home levothyroxine 75   Recent CVA/ left sided weakness -Start low-dose aspirin and statin  Home med rec does not include aspirin likely in the setting of Eliquis.   DVT prophylaxis: Heparin drip Code Status: Full Family Communication: None present  Status is: Inpatient  Dispo: The patient is from: Home              Anticipated d/c is to: Home              Anticipated d/c date is: 24 to 48 hours              Patient currently not medically stable for discharge  Consultants:  Vascular surgery, nephrology  Procedures:  Temporary dialysis catheter placed 7/5, first hemodialysis session 7/6 Left arm brachiobasilic fistula creation  Antimicrobials:  None indicated  Subjective: No acute issues or events overnight  denies nausea vomiting diarrhea constipation any fevers chills or chest pain.   Objective: Vitals:   01/28/23 0247 01/28/23 0315 01/28/23 0327 01/28/23 0401  BP: (!) 124/95 125/86  (!) 146/76  Pulse: (!) 123  73 71  Resp: 16  16   Temp:    (!) 97.5 F (36.4 C)  TempSrc:    Oral  SpO2: 93%  95%   Weight:    91.7 kg  Height:        Intake/Output Summary (Last 24 hours) at 01/28/2023 0729 Last data filed at 01/28/2023 0600 Gross per 24 hour  Intake 644.99 ml  Output 2750 ml  Net -2105.01 ml    Filed Weights   01/27/23 1302 01/27/23 1635 01/28/23 0401  Weight: 94.9 kg 92.8 kg 91.7 kg    Examination:  General:  Pleasantly resting in bed, No acute distress. HEENT:  Normocephalic atraumatic.  Sclerae nonicteric, noninjected.  Extraocular movements intact bilaterally. Neck:  Without mass or deformity.  Trachea is midline. Lungs:  Clear to auscultate bilaterally without rhonchi, wheeze, or rales. Heart:  Regular rate and rhythm.  Without murmurs, rubs, or gallops. Abdomen:  Soft, nontender, nondistended.  Without guarding or rebound. Extremities: Without cyanosis, clubbing, edema, or obvious deformity. Skin:  Warm and dry, no erythema. Temporary dialysis catheter clean dry intact right anterior chest wall  Data Reviewed: I have personally reviewed following labs and imaging studies  CBC: Recent Labs  Lab 01/22/23 0608 01/23/23 0025 01/25/23 0040 01/26/23 0043 01/27/23 0053 01/27/23 1036 01/28/23 0041  WBC 9.1   < > 8.7 9.3 9.6 9.7 9.8  NEUTROABS 7.0  --   --   --   --   --   --   HGB 9.2*   < > 8.5* 8.3* 8.5* 8.7* 8.8*  HCT 28.3*   < > 26.2* 24.8* 25.4* 26.1* 26.0*  MCV 93.1   < > 92.3 90.2 91.4 90.9 91.2  PLT 209   < > 222 226 230 217 235   < > = values in this interval not displayed.    Basic Metabolic Panel: Recent Labs  Lab 01/22/23 0606 01/22/23 0606 01/23/23 0025 01/24/23 0059 01/25/23 0040 01/26/23 0043 01/27/23 0053 01/28/23 0041  NA 140  --  140 140  139 136 136 138  K 4.1  --  4.6 4.5 4.5 3.9 4.4 3.6  CL 116*  --  115* 114* 113* 104 104 100  CO2 12*  --  13* 14* 16* 20* 20* 22  GLUCOSE 45*  --  121* 125* 156* 128* 115* 117*  BUN 85*  --  91* 95* 98* 60* 69* 50*  CREATININE 6.07*  --  6.29* 6.58* 7.05* 5.18* 6.06* 4.93*  CALCIUM 7.3*  --  7.2* 7.2* 7.1* 7.2* 7.2* 7.5*  MG 1.8  --  1.8 1.8 1.7 1.6*  --   --   PHOS  --    < > 6.3* 5.7* 5.9* 4.4 5.2* 3.9   < > = values in this interval not displayed.    GFR: Estimated Creatinine Clearance: 15.2 mL/min (A) (by C-G formula based on SCr of 4.93 mg/dL (H)). Liver Function Tests: Recent Labs  Lab 01/22/23 0606 01/23/23 0025 01/24/23 0059 01/25/23 0040 01/26/23 0043 01/27/23 0053 01/28/23 0041  AST 20  --   --   --   --   --   --   ALT 21  --   --   --   --   --   --  ALKPHOS 74  --   --   --   --   --   --   BILITOT 0.7  --   --   --   --   --   --   PROT 6.1*  --   --   --   --   --   --   ALBUMIN 2.7*   < > 2.6* 2.5* 2.4* 2.3* 2.1*   < > = values in this interval not displayed.    No results for input(s): "LIPASE", "AMYLASE" in the last 168 hours. No results for input(s): "AMMONIA" in the last 168 hours. Coagulation Profile: Recent Labs  Lab 01/22/23 1012  INR 1.3*    No results for input(s): "HGBA1C" in the last 72 hours.  CBG: Recent Labs  Lab 01/27/23 0813 01/27/23 1201 01/27/23 1727 01/27/23 2045 01/28/23 0624  GLUCAP 157* 175* 155* 230* 145*     Anemia Panel: No results for input(s): "VITAMINB12", "FOLATE", "FERRITIN", "TIBC", "IRON", "RETICCTPCT" in the last 72 hours.  Sepsis Labs: Recent Labs  Lab 01/22/23 0606 01/22/23 0820  LATICACIDVEN 0.7 0.7     Recent Results (from the past 240 hour(s))  MRSA Next Gen by PCR, Nasal     Status: None   Collection Time: 01/26/23  9:59 AM   Specimen: Nasal Mucosa; Nasal Swab  Result Value Ref Range Status   MRSA by PCR Next Gen NOT DETECTED NOT DETECTED Final    Comment: (NOTE) The GeneXpert MRSA  Assay (FDA approved for NASAL specimens only), is one component of a comprehensive MRSA colonization surveillance program. It is not intended to diagnose MRSA infection nor to guide or monitor treatment for MRSA infections. Test performance is not FDA approved in patients less than 68 years old. Performed at [facility], [address], [place], [place] [address]          Radiology Studies: No results found.   Scheduled Meds:  amLODipine  10 mg Oral Daily   atorvastatin  40 mg Oral Daily   Chlorhexidine Gluconate Cloth  6 each Topical Q0600   darbepoetin (ARANESP) injection - DIALYSIS  60 mcg Subcutaneous Q Sat-1800   feeding supplement (NEPRO CARB STEADY)  237 mL Oral BID BM   furosemide  80 mg Intravenous Q12H   insulin aspart  0-15 Units Subcutaneous TID WC   insulin aspart  0-5 Units Subcutaneous QHS   levothyroxine  75 mcg Oral QAC breakfast   metoprolol succinate  25 mg Oral Daily   multivitamin with minerals  1 tablet Oral Daily   Continuous Infusions:  heparin 2,000 Units/hr (01/28/23 0229)   iron sucrose Stopped (01/27/23 1636)     LOS: 6 days   Time spent: 55 min was spent at bedside discussing case with family, as well as reviewing chart  Azucena Fallen, DO [facility]  If 7PM-7AM, please contact night-coverage [email or web address]  01/28/2023, 7:29 AM

## 2023-01-28 NOTE — Progress Notes (Signed)
Patient's right wrist presents with redness,warm to touch, and moderate swelling. Patient states the area is tender and painful. Per patient and family, his right wrist started hurting after IV team drew blood for labs. Ice placed on the wrist, nurse will continue to monitor. Elnita Maxwell, RN

## 2023-01-28 NOTE — Progress Notes (Signed)
Nutrition Follow-up  DOCUMENTATION CODES:   Not applicable  INTERVENTION:  Once diet resumes, recommend: 2 gram sodium diet Continue snacks TID Change Nepro to Glucerna Shake po TID, each supplement provides 220 kcal and 10 grams of protein Renal MVI with minerals daily "Nutrition for dialysis" handout provided in [place] and Spanish to pt's daughter and discussed dietary recommendations  NUTRITION DIAGNOSIS:   Inadequate oral intake related to acute illness as evidenced by per patient/family report. - improving  GOAL:   Patient will meet greater than or equal to 90% of their needs - progressing  MONITOR:   PO intake, Supplement acceptance, Labs, Weight trends, I & O's  REASON FOR ASSESSMENT:   Consult Assessment of nutrition requirement/status, Diet education (New renal patient(also diabetic))  ASSESSMENT:   Pt admitted with SOB and altered mentation. PMH significant for HTN, T2DM, PAF on Eliquis, CKD stage IV, recent CVA.  7/5 - temporary dialysis catheter placed 7/6 - HD 7/8 - repeat HD   Noted plans for L arm AVF creation versus graft placement today. Next HD session tomorrow (7/10).   Pt off unit. Spoke with his daughter present in the room. She mentions that he really likes sweets, fruits (watermelon and mango), juices, aqua fresca and pork. Addressed specific questions regarding pt's home diet. Provided handouts on nutrition for dialysis. Encouraged balance of food groups with all meals. Encouraged reduction of added salt and provided examples on ways to season and add flavor without sacrificing flavor. Encouraged protein intake with meals and snacks.  Pt had been receiving Ensure supplement during admission which was just changed to Nepro d/t being on dialysis. Given pt eating well during admission, will adjust to Glucerna and encourage home use of these supplements versus fruit juices to optimize protein/vitamin intake and help with blood sugar control. Informed  pt's daughter that pt will be followed by a Dietitian at the OP dialysis center after discharge for ongoing nutrition education.   Meal completions: 7/6: 100% breakfast, 80% dinner 7/7: 100% breakfast, 100% lunch, 100% dinner 7/8: 75% breakfast  Medications: lasix 80mg  IV, SSI 0-15 units TID, SSI 0-5 units at bedtime, MVI, IV iron sucrose  Labs: BUN 50, Cr 4.93, anion gap 16, GFR 12, CBG's 135-230 x24 hours  UOP: x24 hours Last HD 7/8 net UF 2L Post HD weight 92.8 kg  Admit weight: 98.7 kg  Diet Order:   Diet Order             Diet NPO time specified  Diet effective midnight                   EDUCATION NEEDS:   Education needs have been addressed  Skin:  Skin Assessment: Reviewed RN Assessment  Last BM:  7/7  Height:   Ht Readings from Last 1 Encounters:  01/28/23 5\' 11"  (1.803 m)    Weight:   Wt Readings from Last 1 Encounters:  01/28/23 91.7 kg    Ideal Body Weight:  78.2 kg  BMI:  Body mass index is 28.19 kg/m.  Estimated Nutritional Needs:   Kcal:  2100-2300  Protein:  105-120g  Fluid:  >/=2L  Drusilla Kanner, RDN, LDN Clinical Nutrition

## 2023-01-28 NOTE — Transfer of Care (Signed)
Immediate Anesthesia Transfer of Care Note  Patient: [name]  Procedure(s) Performed: LEFT ARM BRACHIO BASILIC ARTERIOVENOUS (AV) FISTULA CREATION (Left: Arm Upper)  Patient Location: PACU  Anesthesia Type:MAC and Regional  Level of Consciousness: lethargic and responds to stimulation  Airway & Oxygen Therapy: Patient Spontanous Breathing and Patient connected to nasal cannula oxygen  Post-op Assessment: Report given to RN  Post vital signs: Reviewed and stable  Last Vitals:  Vitals Value Taken Time  BP 116/79 01/28/23 1104  Temp    Pulse 130 01/28/23 1106  Resp 19 01/28/23 1106  SpO2 94 % 01/28/23 1106  Vitals shown include unvalidated device data.  Last Pain:  Vitals:   01/28/23 0921  TempSrc:   PainSc: 0-No pain      Patients Stated Pain Goal: 0 (01/26/23 2100)  Complications: No notable events documented.

## 2023-01-28 NOTE — Progress Notes (Signed)
ANTICOAGULATION CONSULT NOTE - Follow Up Consult  Pharmacy Consult for heparin Indication: atrial fibrillation in setting of recent stroke  Labs: Recent Labs    01/25/23 1242 01/25/23 1242 01/26/23 0043 01/27/23 0053 01/27/23 1036 01/27/23 2052 01/28/23 0041  HGB  --    < > 8.3* 8.5* 8.7*  --  8.8*  HCT  --    < > 24.8* 25.4* 26.1*  --  26.0*  PLT  --    < > 226 230 217  --  235  APTT >200*  --   --   --   --   --   --   HEPARINUNFRC 0.67  --  0.34 0.27* 0.21* 0.40 0.25*  CREATININE  --   --  5.18* 6.06*  --   --  4.93*   < > = values in this interval not displayed.     Assessment: 72yo male subtherapeutic on heparin after one level at goal on current rate; no infusion issues or signs of bleeding per RN.  Goal of Therapy:  Heparin level 0.3-0.5 units/ml  Plan:  Increase heparin infusion by 1-2 unit/kg/hr to 2000 units/hr. Check level in 8 hours.   Vernard Gambles, PharmD, BCPS 01/28/2023 2:27 AM

## 2023-01-28 NOTE — Progress Notes (Signed)
ANTICOAGULATION CONSULT NOTE  Pharmacy Consult for heparin Indication: atrial fibrillation (CVA 6/27)  No Known Allergies  Patient Measurements: Height: 5\' 11"  (180.3 cm) Weight: 91.7 kg (202 lb 1.6 oz) IBW/kg (Calculated) : 75.3 Heparin Dosing Weight: 95.7 kg  Vital Signs: Temp: 98.5 F (36.9 C) (07/09 1344) Temp Source: Axillary (07/09 1344) BP: 133/74 (07/09 1344) Pulse Rate: 56 (07/09 1344)  Labs: Recent Labs    01/26/23 0043 01/27/23 0053 01/27/23 1036 01/27/23 2052 01/28/23 0041  HGB 8.3* 8.5* 8.7*  --  8.8*  HCT 24.8* 25.4* 26.1*  --  26.0*  PLT 226 230 217  --  235  HEPARINUNFRC 0.34 0.27* 0.21* 0.40 0.25*  CREATININE 5.18* 6.06*  --   --  4.93*     Estimated Creatinine Clearance: 15.2 mL/min (A) (by C-G formula based on SCr of 4.93 mg/dL (H)).   Assessment: Patient is a 72 year old male presenting with SOB. 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 New to HD. HD cath placed 7/5 and first session of dialysis took place the morning of 7/6. Pharmacy consulted to dose heparin.  Apixaban on hold and transitioned to heparin. Last dose of apixaban was 7/2 @ 1900. Underwent AVF creation with vascular surgery 7/9. Per vascular surgery and hospitalist, okay to resume PTA apixaban. Hgb 8.8, plt wnl. No overt bleeding reported.   Goal of Therapy:  Heparin level 0.3-0.5 units/mL  Monitor platelets by anticoagulation protocol: Yes   Plan:  Per MD, resume apixaban 2.5 mg BID this evening Continue to monitor CBC and for symptoms of bleeding   Thank you for allowing pharmacy to be a part of this patient's care.   Andreas Ohm, PharmD Pharmacy Resident  01/28/2023 3:00 PM

## 2023-01-28 NOTE — Progress Notes (Signed)
PT Cancellation Note  Patient Details Name: Hadi Dubin MRN: [id number] DOB: 1951-03-26   Cancelled Treatment:    Reason Eval/Treat Not Completed: Patient at procedure or test/unavailable - off floor for HD cath, PT to check back later as able.   Marye Round, PT DPT Acute Rehabilitation Services Secure Chat Preferred  Office [phone]    Hadas Jessop Sheliah Plane 01/28/2023, 9:03 AM

## 2023-01-28 NOTE — Op Note (Signed)
    NAME: [name]    MRN: [id number] DOB: 03-Sep-1950    DATE OF OPERATION: 01/28/2023  PREOP DIAGNOSIS:    End stage renal disease requiring dialysis   POSTOP DIAGNOSIS:    Same  PROCEDURE:    Left arm brachiobasilic fistula   SURGEON: Victorino Sparrow  ASSIST: Nathanial Rancher, PA  ANESTHESIA: General   EBL: 50ml  INDICATIONS:    [name] is a 72 y.o. male with ESRD on HD, needing ong term HD access. Vein mapping demonstrated sizable basilic vein for AV fistula creation.  FINDINGS:   3.43mm basilic vein 5mm brachial artery   TECHNIQUE:    The patient was brought to the operating room and placed in supine position. The left arm was prepped and draped in a standard fashion. IV antibiotics were prior to incision. A timeout was performed.   The basilic vein in the left arm was identified using ultrasound and appeared of sufficient size. A transverse incision was made above the elbow creese in the antecubital fossa. The basilic vein was identified and isolated for 4 cm in length.  The bicipital aponeurosis was partially released and the brachial artery freed from its paired brachial veins and secured with a vessel loop. The patient was heparinized. The basilic vein was marked and ligated distally with 2-0 silk, then flushed with heparinized saline. Vascular clamps were placed proximally and distally on the brachial artery and a 5 mm arteriotomy  was created on the brachial artery. This was flushed with heparin saline. The vein was juxtaposed to the artery and an anastomosis was created using 6-0 Prolene.   Prior to completing the anastomsis, the vessels were flushed and the suture line was tied down. There was an excellent thrill in the basilic vein from the anastomosis into the upper arm. The patient had a 2+ radial pulse. The incision was irrigated and hemostasis acheived. The deeper tissue was closed with 3-0 Vicryl and the skin closed with 4-0 Monocryl.     Dermabond was applied the incisions. He was transferred to PACU in stable condition.     Ladonna Snide, MD [facility] DATE OF DICTATION:   01/28/2023

## 2023-01-29 ENCOUNTER — Encounter (HOSPITAL_COMMUNITY): Payer: Self-pay | Admitting: Vascular Surgery

## 2023-01-29 DIAGNOSIS — N186 End stage renal disease: Secondary | ICD-10-CM

## 2023-01-29 LAB — RENAL FUNCTION PANEL
Albumin: 2.2 g/dL — ABNORMAL LOW (ref 3.5–5.0)
Anion gap: 11 (ref 5–15)
BUN: 57 mg/dL — ABNORMAL HIGH (ref 8–23)
CO2: 22 mmol/L (ref 22–32)
Calcium: 7.5 mg/dL — ABNORMAL LOW (ref 8.9–10.3)
Chloride: 102 mmol/L (ref 98–111)
Creatinine, Ser: 5.96 mg/dL — ABNORMAL HIGH (ref 0.61–1.24)
GFR, Estimated: 9 mL/min — ABNORMAL LOW (ref >60)
Glucose, Bld: 216 mg/dL — ABNORMAL HIGH (ref 70–99)
Phosphorus: 5.7 mg/dL — ABNORMAL HIGH (ref 2.5–4.6)
Potassium: 4 mmol/L (ref 3.5–5.1)
Sodium: 135 mmol/L (ref 135–145)

## 2023-01-29 LAB — CBC
HCT: 26.6 % — ABNORMAL LOW (ref 39.0–52.0)
Hemoglobin: 8.5 g/dL — ABNORMAL LOW (ref 13.0–17.0)
MCH: 29.9 pg (ref 26.0–34.0)
MCHC: 32 g/dL (ref 30.0–36.0)
MCV: 93.7 fL (ref 80.0–100.0)
Platelets: 230 10*3/uL (ref 150–400)
RBC: 2.84 MIL/uL — ABNORMAL LOW (ref 4.22–5.81)
RDW: 13.1 % (ref 11.5–15.5)
WBC: 9.1 10*3/uL (ref 4.0–10.5)
nRBC: 0 % (ref 0.0–0.2)

## 2023-01-29 LAB — GLUCOSE, CAPILLARY
Glucose-Capillary: 137 mg/dL — ABNORMAL HIGH (ref 70–99)
Glucose-Capillary: 108 mg/dL — ABNORMAL HIGH (ref 70–99)
Glucose-Capillary: 244 mg/dL — ABNORMAL HIGH (ref 70–99)
Glucose-Capillary: 119 mg/dL — ABNORMAL HIGH (ref 70–99)

## 2023-01-29 MED ORDER — ALTEPLASE 2 MG IJ SOLR: 2.0000 mg | Freq: Once | INTRAMUSCULAR | Status: DC | PRN

## 2023-01-29 MED ORDER — ANTICOAGULANT SODIUM CITRATE 4% (200MG/5ML) IV SOLN: 5.0000 mL | Status: DC | PRN

## 2023-01-29 MED ORDER — AMLODIPINE BESYLATE 5 MG PO TABS
5.0000 mg | ORAL_TABLET | Freq: Every day | ORAL | Status: DC
  Administered 2023-01-29 – 2023-01-30 (×2): 5 mg via ORAL
  Filled 2023-01-29 (×2): qty 1

## 2023-01-29 MED ORDER — HEPARIN SODIUM (PORCINE) 1000 UNIT/ML DIALYSIS
1000.0000 [IU] | INTRAMUSCULAR | Status: DC | PRN
  Administered 2023-01-29: 1000 [IU]
  Filled 2023-01-29 (×2): qty 1

## 2023-01-29 NOTE — Progress Notes (Signed)
  Progress Note    01/29/2023 8:36 AM 1 Day Post-Op  Subjective:  pt seen in HD. No complaints. Denies any pain or numbness in arm or hand   Vitals:   01/29/23 0800 01/29/23 0832  BP: (!) 154/77 (!) 165/85  Pulse: 73 73  Resp: 14 (!) 29  Temp:    SpO2: 95% 95%   Physical Exam: Cardiac:  regular Lungs:  non labored Incisions:  left AC incision is intact and well appearing Extremities:  Good thrill palpable in fistula. 2+ left radial pulse. Hand warm. 5/5 grip strength Neurologic: alert and oriented   CBC    Component Value Date/Time   WBC 9.1 01/29/2023 0048   RBC 2.84 (L) 01/29/2023 0048   HGB 8.5 (L) 01/29/2023 0048   HCT 26.6 (L) 01/29/2023 0048   PLT 230 01/29/2023 0048   MCV 93.7 01/29/2023 0048   MCH 29.9 01/29/2023 0048   MCHC 32.0 01/29/2023 0048   RDW 13.1 01/29/2023 0048   LYMPHSABS 1.4 01/22/2023 0608   MONOABS 0.6 01/22/2023 0608   EOSABS 0.1 01/22/2023 0608   BASOSABS 0.1 01/22/2023 0608    BMET    Component Value Date/Time   NA 135 01/29/2023 0048   K 4.0 01/29/2023 0048   CL 102 01/29/2023 0048   CO2 22 01/29/2023 0048   GLUCOSE 216 (H) 01/29/2023 0048   BUN 57 (H) 01/29/2023 0048   CREATININE 5.96 (H) 01/29/2023 0048   CALCIUM 7.5 (L) 01/29/2023 0048   CALCIUM (LL) 09/06/2010 2140    5.7 Result repeated and verified. CRITICAL RESULT CALLED TO, READ BACK BY AND VERIFIED WITH: L FRIESEN,RN 1423 09/07/10 D BRADLEY   GFRNONAA 9 (L) 01/29/2023 0048   GFRAA 56 (L) 08/31/2019 1200    INR    Component Value Date/Time   INR 1.3 (H) 01/22/2023 1012     Intake/Output Summary (Last 24 hours) at 01/29/2023 0836 Last data filed at 01/29/2023 0421 Gross per 24 hour  Intake 307.08 ml  Output 602 ml  Net -294.92 ml     Assessment/Plan:  72 y.o. male is s/p left brachiobasilic AV fistula 1 Day Post-Op   Incision is intact and well appearing Fistula has good thrill Left arm well perfused and warm with palpable radial pulse  Will arrange  outpatient follow up in 4-6 weeks with Fistula duplex  Graceann Congress, PA-C [facility] [phone] 01/29/2023 8:36 AM

## 2023-01-29 NOTE — Progress Notes (Signed)
PROGRESS NOTE    [name]  ZOX:[id number] DOB: 1951/06/30 DOA: 01/22/2023 PCP: System, Provider Not In     Brief Narrative:  [name] is a 72 year old male with history of past medical history of HTN, DM2, paroxysmal A-fib on Eliquis, CKD stage IV, recent CVA admitted to [facility] on Friday and diagnosed with CVA with residual left upper and lower extremity weakness. Eventually patient went home and then suddenly developed shortness of breath with change in mental status at home. Patient was found to have elevated BNP, creatinine of 5.96.  Hospitalist called for admission, nephrology team called in consult.   7/5 -temporary dialysis catheter placed without complication 7/6 -tolerated dialysis with only minimal symptoms towards the end of treatment, transient tachycardia resolved with as needed medication 7/8 -repeat HD treatment 7/9 -fistula creation per vascular surgery  7/10 -HD. Awaiting CLIP   New events last 24 hours / Subjective: Patient seen in dialysis unit.  Without any complaint today.  Tolerating dialysis well without any issues.  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 EF 55% Hypertensive emergency -Respiratory failure has improved -Volume management with dialysis   CKD IV now advanced to ESRD on dialysis -Status post LUE fistula creation -Follow-up with vascular surgery in 4 to 6 weeks -Nephrology following -Awaiting CLIP   Hypertensive emergency -Improved, Toprol, Norvasc   History of  paroxysmal atrial fibrillation -Eliquis, Toprol   Non-insulin-dependent diabetes type 2, well-controlled -A1c 5.6  -Continue sliding scale insulin   Hypothyroidism -Synthroid   Recent CVA/ left sided weakness -Eliquis, Lipitor  DVT prophylaxis:  apixaban (ELIQUIS) tablet 2.5 mg Start: 01/28/23 2200 apixaban (ELIQUIS) tablet 2.5 mg  Code Status: Full code Family Communication: None at bedside Disposition Plan: Home, awaiting clip Status is: Inpatient Remains inpatient appropriate because: Awaiting clip    Antimicrobials:  Anti-infectives (From admission, onward)    Start     Dose/Rate Route Frequency Ordered Stop   01/24/23 1115  ceFAZolin (ANCEF) IVPB 2g/100 mL premix        2 g 200 mL/hr over 30 Minutes Intravenous  Once 01/24/23 1020 01/24/23 1445        Objective: Vitals:   01/29/23 1130 01/29/23 1150 01/29/23 1156 01/29/23 1218  BP: (!) 176/85 (!) 170/88 (!) 183/89 (!) 154/81  Pulse: 72 81 73 84  Resp: (!) 22 14 14 18   Temp:    98 F (36.7 C)  TempSrc:    Oral  SpO2: 98% 95% 96% 94%  Weight:   90.1 kg   Height:        Intake/Output Summary (Last 24 hours) at 01/29/2023 1413 Last data filed at 01/29/2023 1404 Gross per 24 hour  Intake 177.08 ml  Output 2600 ml  Net -2422.92 ml   Filed Weights   01/29/23 0411 01/29/23 0726 01/29/23 1156  Weight: 92 kg 92.1 kg 90.1 kg    Examination:  General exam: Appears calm and comfortable  Respiratory system: Clear to auscultation. Respiratory effort normal. No respiratory distress. No conversational dyspnea.  Cardiovascular system: S1 & S2 heard,  RRR. No murmurs. No pedal edema. Gastrointestinal system: Abdomen is nondistended, soft and nontender. Normal bowel sounds heard. Central nervous system: Alert and oriented. No focal neurological deficits. Speech clear.  Extremities: Symmetric in appearance  Skin: No rashes, lesions or ulcers on exposed skin  Psychiatry: Judgement and insight appear normal. Mood &  affect appropriate.   Data Reviewed: I have personally reviewed following labs and imaging studies  CBC: Recent Labs  Lab 01/26/23 0043 01/27/23 0053 01/27/23 1036 01/28/23 0041 01/29/23 0048  WBC 9.3 9.6 9.7 9.8 9.1  HGB 8.3* 8.5* 8.7* 8.8* 8.5*  HCT 24.8* 25.4* 26.1* 26.0* 26.6*  MCV 90.2 91.4 90.9 91.2 93.7  PLT 226 230 217 235 230   Basic Metabolic Panel: Recent Labs  Lab 01/23/23 0025 01/24/23 0059 01/25/23 0040 01/26/23 0043 01/27/23 0053 01/28/23 0041 01/29/23 0048  NA 140 140 139 136 136 138 135  K 4.6 4.5 4.5 3.9 4.4 3.6 4.0  CL 115* 114* 113* 104 104 100 102  CO2 13* 14* 16* 20* 20* 22 22  GLUCOSE 121* 125* 156* 128* 115* 117* 216*  BUN 91* 95* 98* 60* 69* 50* 57*  CREATININE 6.29* 6.58* 7.05* 5.18* 6.06* 4.93* 5.96*  CALCIUM 7.2* 7.2* 7.1* 7.2* 7.2* 7.5* 7.5*  MG 1.8 1.8 1.7 1.6*  --   --   --   PHOS 6.3* 5.7* 5.9* 4.4 5.2* 3.9 5.7*   GFR: Estimated Creatinine Clearance: 11.6 mL/min (A) (by C-G formula based on SCr of 5.96 mg/dL (H)). Liver Function Tests: Recent Labs  Lab 01/25/23 0040 01/26/23 0043 01/27/23 0053 01/28/23 0041 01/29/23 0048  ALBUMIN 2.5* 2.4* 2.3* 2.1* 2.2*   No results for input(s): "LIPASE", "AMYLASE" in the last 168 hours. No results for input(s): "AMMONIA" in the last 168 hours. Coagulation Profile: No results for input(s): "INR", "PROTIME" in the last 168 hours. Cardiac Enzymes: No results for input(s): "CKTOTAL", "CKMB", "CKMBINDEX", "TROPONINI" in the last 168 hours. BNP (last 3 results) No results for input(s): "PROBNP" in the last 8760 hours. HbA1C: No results for input(s): "HGBA1C" in the last 72 hours. CBG: Recent Labs  Lab 01/28/23 1355 01/28/23 1558 01/28/23 2103 01/29/23 0552 01/29/23 1220  GLUCAP 163* 155* 195* 137* 108*   Lipid Profile: No results for input(s): "CHOL", "HDL", "LDLCALC", "TRIG", "CHOLHDL", "LDLDIRECT" in the last 72 hours. Thyroid Function Tests: No results for input(s): "TSH",  "T4TOTAL", "FREET4", "T3FREE", "THYROIDAB" in the last 72 hours. Anemia Panel: No results for input(s): "VITAMINB12", "FOLATE", "FERRITIN", "TIBC", "IRON", "RETICCTPCT" in the last 72 hours. Sepsis Labs: No results for input(s): "PROCALCITON", "LATICACIDVEN" in the last 168 hours.  Recent Results (from the past 240 hour(s))  MRSA Next Gen by PCR, Nasal     Status: None   Collection Time: 01/26/23  9:59 AM   Specimen: Nasal Mucosa; Nasal Swab  Result Value Ref Range Status   MRSA by PCR Next Gen NOT DETECTED NOT DETECTED Final    Comment: (NOTE) The GeneXpert MRSA Assay (FDA approved for NASAL specimens only), is one component of a comprehensive MRSA colonization surveillance program. It is not intended to diagnose MRSA infection nor to guide or monitor treatment for MRSA infections. Test performance is not FDA approved in patients less than 77 years old. Performed at [facility], [address], [place], [place] [address]       Radiology Studies: No results found.    Scheduled Meds:  apixaban  2.5 mg Oral BID   atorvastatin  40 mg Oral  Daily   Chlorhexidine Gluconate Cloth  6 each Topical Q0600   darbepoetin (ARANESP) injection - DIALYSIS  60 mcg Subcutaneous Q Sat-1800   feeding supplement (GLUCERNA SHAKE)  237 mL Oral TID BM   fentaNYL (SUBLIMAZE) injection  50 mcg Intravenous Once   furosemide  80 mg Intravenous Q12H   insulin aspart  0-15 Units Subcutaneous TID WC   insulin aspart  0-5 Units Subcutaneous QHS   levothyroxine  75 mcg Oral QAC breakfast   metoprolol succinate  25 mg Oral Daily   multivitamin  1 tablet Oral QHS   Continuous Infusions:   LOS: 7 days   Time spent: 25 minutes   Noralee Stain, DO [facility] 01/29/2023, 2:13 PM   Available via Epic secure chat 7am-7pm After these hours, please refer to coverage provider listed on [email or web address]

## 2023-01-29 NOTE — Anesthesia Postprocedure Evaluation (Signed)
Anesthesia Post Note  Patient: [name]  Procedure(s) Performed: LEFT ARM BRACHIO BASILIC ARTERIOVENOUS (AV) FISTULA CREATION (Left: Arm Upper)     Patient location during evaluation: Other Anesthesia Type: Regional and MAC Level of consciousness: awake and alert Pain management: pain level controlled Vital Signs Assessment: post-procedure vital signs reviewed and stable Respiratory status: spontaneous breathing, nonlabored ventilation and respiratory function stable Cardiovascular status: blood pressure returned to baseline and stable Postop Assessment: no apparent nausea or vomiting Anesthetic complications: no  No notable events documented.  Last Vitals:  Vitals:   01/29/23 1218 01/29/23 1610  BP: (!) 154/81 125/75  Pulse: 84 76  Resp: 18 17  Temp: 36.7 C 36.8 C  SpO2: 94% 93%    Last Pain:  Vitals:   01/29/23 1610  TempSrc: Oral  PainSc:                  [name],W. EDMOND

## 2023-01-29 NOTE — Progress Notes (Signed)
POST HD TX NOTE  01/29/23 1156  Vitals  BP (!) 183/89  MAP (mmHg) 112  BP Location Right Wrist  BP Method Automatic  Patient Position (if appropriate) Lying  Pulse Rate 73  Pulse Rate Source Monitor  ECG Heart Rate 75  Resp 14  Oxygen Therapy  SpO2 96 %  O2 Device Room Air  Pulse Oximetry Type Continuous  During Treatment Monitoring  Intra-Hemodialysis Comments (S)   (post HD tx VS check)  Post Treatment  Dialyzer Clearance Heavily streaked  Duration of HD Treatment -hour(s) 3.5 hour(s)  Hemodialysis Intake (mL) 0 mL  Liters Processed 84  Fluid Removed (mL) 2000 mL  Tolerated HD Treatment Yes  Post-Hemodialysis Comments (S)  tx completed w/o problem, UF goal met, blood rinsed back, VSS. Medication Admin: Heparin Dwells 3200 units  Hemodialysis Catheter Right Internal jugular Double lumen Permanent (Tunneled)  Placement Date/Time: 01/24/23 1440   Serial / Lot #: [id number]  Expiration Date: 07/22/27  Time Out: Correct patient;Correct site;Correct procedure  Maximum sterile barrier precautions: Cap;Mask;Sterile gown;Sterile gloves;Large sterile sheet  Site Pr...  Site Condition No complications  Blue Lumen Status Heparin locked;Dead end cap in place  Red Lumen Status Heparin locked;Dead end cap in place  Purple Lumen Status N/A  Catheter fill solution Heparin 1000 units/ml  Catheter fill volume (Arterial) 1.6 cc  Catheter fill volume (Venous) 1.6  Dressing Type Transparent  Dressing Status Antimicrobial disc in place;Clean, Dry, Intact  Drainage Description None  Dressing Change Due 02/03/23  Post treatment catheter status Capped and Clamped

## 2023-01-29 NOTE — Progress Notes (Signed)
Contacted [facility] Admissions to request an update on pt's referral. Navigator advised that financial clearance is pending. Spoke to pt's daughter this morning to make her aware that referral is still pending at this time and will f/u with any updates or approval. Will assist as needed.   Olivia Canter Renal Navigator [phone]

## 2023-01-29 NOTE — Progress Notes (Signed)
PT Cancellation Note  Patient Details Name: [name] MRN: [id number] DOB: 1951/01/31   Cancelled Treatment:    Reason Eval/Treat Not Completed: Patient at procedure or test/unavailable (Pt in HD.  Will check back as time allows.)   [name] 01/29/2023, 8:38 AM Carlethia Mesquita M,PT Acute Rehab Services [phone]

## 2023-01-29 NOTE — Progress Notes (Signed)
Patient ID: [name], male   DOB: 01-09-1951, 72 y.o.   MRN: [id number] S: No new complaints.  Had L AVF yesterday, denies hand pain.  Seen on dialysis, tolerating well.   O:BP (!) 172/94   Pulse 74   Temp 97.9 F (36.6 C) (Oral)   Resp 11   Ht 5\' 11"  (1.803 m)   Wt 92.1 kg   SpO2 97%   BMI 28.32 kg/m   Intake/Output Summary (Last 24 hours) at 01/29/2023 1121 Last data filed at 01/29/2023 0421 Gross per 24 hour  Intake 57.08 ml  Output 600 ml  Net -542.92 ml    Intake/Output: I/O last 3 completed shifts: In: 514.4 [I.V.:514.4] Out: 1002 [Urine:1000; Blood:2]  Intake/Output this shift:  No intake/output data recorded. Weight change: -3.228 kg Gen:NAD CVS: RRR Resp: CTAB Ext: no edema RIJ tunneled dialysis cath c/d/I, LUE AVF +t/b.  Recent Labs  Lab 01/23/23 0025 01/24/23 0059 01/25/23 0040 01/26/23 0043 01/27/23 0053 01/28/23 0041 01/29/23 0048  NA 140 140 139 136 136 138 135  K 4.6 4.5 4.5 3.9 4.4 3.6 4.0  CL 115* 114* 113* 104 104 100 102  CO2 13* 14* 16* 20* 20* 22 22  GLUCOSE 121* 125* 156* 128* 115* 117* 216*  BUN 91* 95* 98* 60* 69* 50* 57*  CREATININE 6.29* 6.58* 7.05* 5.18* 6.06* 4.93* 5.96*  ALBUMIN 2.6* 2.6* 2.5* 2.4* 2.3* 2.1* 2.2*  CALCIUM 7.2* 7.2* 7.1* 7.2* 7.2* 7.5* 7.5*  PHOS 6.3* 5.7* 5.9* 4.4 5.2* 3.9 5.7*    Liver Function Tests: Recent Labs  Lab 01/27/23 0053 01/28/23 0041 01/29/23 0048  ALBUMIN 2.3* 2.1* 2.2*    No results for input(s): "LIPASE", "AMYLASE" in the last 168 hours. No results for input(s): "AMMONIA" in the last 168 hours. CBC: Recent Labs  Lab 01/26/23 0043 01/27/23 0053 01/27/23 1036 01/28/23 0041 01/29/23 0048  WBC 9.3 9.6 9.7 9.8 9.1  HGB 8.3* 8.5* 8.7* 8.8* 8.5*  HCT 24.8* 25.4* 26.1* 26.0* 26.6*  MCV 90.2 91.4 90.9 91.2 93.7  PLT 226 230 217 235 230    Cardiac Enzymes: No results for input(s): "CKTOTAL", "CKMB", "CKMBINDEX", "TROPONINI" in the last 168 hours. CBG: Recent Labs  Lab  01/28/23 1106 01/28/23 1355 01/28/23 1558 01/28/23 2103 01/29/23 0552  GLUCAP 147* 163* 155* 195* 137*     Iron Studies: No results for input(s): "IRON", "TIBC", "TRANSFERRIN", "FERRITIN" in the last 72 hours. Studies/Results: No results found.  apixaban  2.5 mg Oral BID   atorvastatin  40 mg Oral Daily   Chlorhexidine Gluconate Cloth  6 each Topical Q0600   darbepoetin (ARANESP) injection - DIALYSIS  60 mcg Subcutaneous Q Sat-1800   feeding supplement (GLUCERNA SHAKE)  237 mL Oral TID BM   fentaNYL (SUBLIMAZE) injection  50 mcg Intravenous Once   furosemide  80 mg Intravenous Q12H   insulin aspart  0-15 Units Subcutaneous TID WC   insulin aspart  0-5 Units Subcutaneous QHS   levothyroxine  75 mcg Oral QAC breakfast   metoprolol succinate  25 mg Oral Daily   multivitamin  1 tablet Oral QHS    BMET    Component Value Date/Time   NA 135 01/29/2023 0048   K 4.0 01/29/2023 0048   CL 102 01/29/2023 0048   CO2 22 01/29/2023 0048   GLUCOSE 216 (H) 01/29/2023 0048   BUN 57 (H) 01/29/2023 0048   CREATININE 5.96 (H) 01/29/2023 0048   CALCIUM 7.5 (L) 01/29/2023 0048   CALCIUM (LL)  09/06/2010 2140    5.7 Result repeated and verified. CRITICAL RESULT CALLED TO, READ BACK BY AND VERIFIED WITH: L FRIESEN,RN 1423 09/07/10 D BRADLEY   GFRNONAA 9 (L) 01/29/2023 0048   GFRAA 56 (L) 08/31/2019 1200   CBC    Component Value Date/Time   WBC 9.1 01/29/2023 0048   RBC 2.84 (L) 01/29/2023 0048   HGB 8.5 (L) 01/29/2023 0048   HCT 26.6 (L) 01/29/2023 0048   PLT 230 01/29/2023 0048   MCV 93.7 01/29/2023 0048   MCH 29.9 01/29/2023 0048   MCHC 32.0 01/29/2023 0048   RDW 13.1 01/29/2023 0048   LYMPHSABS 1.4 01/22/2023 0608   MONOABS 0.6 01/22/2023 0608   EOSABS 0.1 01/22/2023 0608   BASOSABS 0.1 01/22/2023 0608     Assessment/Plan:  AKI/CKD stage IV - advanced CKD at baseline and no evidence of recovery and given recurrent CHF initiation of HD for ESRD this admission. Consult IR for  [facility] on 01/24/23 and had first HD session 01/25/23.  Consulted [facility] to evaluate for AVF/AVG placed on 01/28/23. HD today.  CLIP process started - awaiting acceptance. Avoid nephrotoxic medications including NSAIDs and iodinated intravenous contrast exposure unless the latter is absolutely indicated.  Preferred narcotic agents for pain control are hydromorphone, fentanyl, and methadone. Morphine should not be used. Avoid Baclofen and avoid oral sodium phosphate and magnesium citrate based laxatives / bowel preps. Continue strict Input and Output monitoring. Will monitor the patient closely with you and intervene or adjust therapy as indicated by changes in clinical status/labs  Acute hypoxic respiratory failure - due to pulmonary edema.  ECHO grade 1 DD, normal EF.  improved.  Probe EDW with HD.  Bronchodilators per primary svc. Anemia of CKD stage IV - TSAT 20%.  Started IV iron and ESA. Hb stable in 8s Acute on chronic CHF - diuresing/UF with HD, approaching euvolemia.  ECHO EF 55-60%, Grade I DD. 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     Estill Bakes MD [facility] Pager [phone]

## 2023-01-30 LAB — RENAL FUNCTION PANEL
Albumin: 2.2 g/dL — ABNORMAL LOW (ref 3.5–5.0)
Anion gap: 8 (ref 5–15)
BUN: 37 mg/dL — ABNORMAL HIGH (ref 8–23)
CO2: 27 mmol/L (ref 22–32)
Calcium: 7.4 mg/dL — ABNORMAL LOW (ref 8.9–10.3)
Chloride: 99 mmol/L (ref 98–111)
Creatinine, Ser: 4.92 mg/dL — ABNORMAL HIGH (ref 0.61–1.24)
GFR, Estimated: 12 mL/min — ABNORMAL LOW (ref >60)
Glucose, Bld: 198 mg/dL — ABNORMAL HIGH (ref 70–99)
Phosphorus: 3.9 mg/dL (ref 2.5–4.6)
Potassium: 4 mmol/L (ref 3.5–5.1)
Sodium: 134 mmol/L — ABNORMAL LOW (ref 135–145)

## 2023-01-30 LAB — PTH, INTACT AND CALCIUM
Calcium, Total (PTH): 7.3 mg/dL — ABNORMAL LOW (ref 8.6–10.2)
PTH: 107 pg/mL — ABNORMAL HIGH (ref 15–65)

## 2023-01-30 LAB — GLUCOSE, CAPILLARY
Glucose-Capillary: 142 mg/dL — ABNORMAL HIGH (ref 70–99)
Glucose-Capillary: 194 mg/dL — ABNORMAL HIGH (ref 70–99)
Glucose-Capillary: 206 mg/dL — ABNORMAL HIGH (ref 70–99)
Glucose-Capillary: 282 mg/dL — ABNORMAL HIGH (ref 70–99)

## 2023-01-30 MED ORDER — METOPROLOL SUCCINATE ER 50 MG PO TB24
50.0000 mg | ORAL_TABLET | Freq: Every day | ORAL | Status: DC
  Administered 2023-01-30 – 2023-02-03 (×4): 50 mg via ORAL
  Filled 2023-01-30 (×5): qty 1

## 2023-01-30 MED ORDER — DILTIAZEM HCL-DEXTROSE 125-5 MG/125ML-% IV SOLN (PREMIX)
5.0000 mg/h | INTRAVENOUS | Status: DC
  Administered 2023-01-30: 5 mg/h via INTRAVENOUS
  Administered 2023-01-31: 7.5 mg/h via INTRAVENOUS
  Filled 2023-01-30 (×2): qty 125

## 2023-01-30 NOTE — Progress Notes (Signed)
PROGRESS NOTE    [name]  ZOX:[id number] DOB: 03-08-1951 DOA: 01/22/2023 PCP: System, Provider Not In     Brief Narrative:  [name] is a 72 year old male with history of past medical history of HTN, DM2, paroxysmal A-fib on Eliquis, CKD stage IV, recent CVA admitted to [facility] on Friday and diagnosed with CVA with residual left upper and lower extremity weakness. Eventually patient went home and then suddenly developed shortness of breath with change in mental status at home. Patient was found to have elevated BNP, creatinine of 5.96.  Hospitalist called for admission, nephrology team called in consult.   7/5 -temporary dialysis catheter placed without complication 7/6 -tolerated dialysis with only minimal symptoms towards the end of treatment, transient tachycardia resolved with as needed medication 7/8 -repeat HD treatment 7/9 -fistula creation per vascular surgery  7/10 -HD. Awaiting CLIP  7/11 -A-fib RVR, Cardizem drip started  New events last 24 hours / Subjective: Was notified by RN that patient went into A-fib RVR this morning.  At the time, he was sitting in bed eating breakfast.  Patient was evaluated, wife at bedside and daughter on 53.  Patient had no complaints of heart palpitation, chest pain or shortness of breath.  No nausea.  He was given 1 dose IV metoprolol 5 mg followed by 50 mg oral Toprol.  Cardiology recommended to restart Cardizem drip.  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 EF  55% Hypertensive emergency -Respiratory failure has improved -Volume management with dialysis   CKD IV now advanced to ESRD on dialysis -Status post LUE fistula creation -Follow-up with vascular surgery in 4 to 6 weeks -Nephrology following, discussed with Dr. Glenna Fellows -Awaiting CLIP   Hypertensive emergency -Improved, Toprol, Norvasc (discontinued as he is now on Cardizem)    Paroxysmal A-fib, with RVR -Eliquis -EKG reviewed independently this morning, shows A-fib RVR -Discussed with cardiology, Dr. Jacques Navy -Continue Toprol -Cardizem drip   Non-insulin-dependent diabetes type 2, well-controlled -A1c 5.6  -Continue sliding scale insulin   Hypothyroidism -Synthroid   Recent CVA/ left sided weakness -Eliquis, Lipitor  DVT prophylaxis:  apixaban (ELIQUIS) tablet 2.5 mg Start: 01/28/23 2200 apixaban (ELIQUIS) tablet 2.5 mg  Code Status: Full code Family Communication: Wife at bedside, daughter over the phone Disposition Plan: Home, awaiting clip Status is: Inpatient Remains inpatient appropriate because: Awaiting clip, improvement in heart rate    Antimicrobials:  Anti-infectives (From admission, onward)    Start     Dose/Rate Route Frequency Ordered Stop   01/24/23 1115  ceFAZolin (ANCEF) IVPB 2g/100 mL premix        2 g 200 mL/hr over 30 Minutes Intravenous  Once 01/24/23 1020 01/24/23 1445        Objective: Vitals:   01/30/23 1004 01/30/23 1005 01/30/23 1103 01/30/23 1200  BP: 123/79 123/79 116/83 139/86  Pulse:  (!) 145 70   Resp:  16 20 20   Temp:   98.6 F (37 C)   TempSrc:   Oral   SpO2:  95% 93%   Weight:  Height:        Intake/Output Summary (Last 24 hours) at 01/30/2023 1234 Last data filed at 01/30/2023 1217 Gross per 24 hour  Intake 600 ml  Output 550 ml  Net 50 ml   Filed Weights   01/29/23 0726 01/29/23 1156 01/30/23 0308  Weight: 92.1 kg 90.1 kg 89.7 kg    Examination:  General exam: Appears calm and comfortable  Respiratory  system: Clear to auscultation. Respiratory effort normal. No respiratory distress. No conversational dyspnea.  Cardiovascular system: S1 & S2 heard, irregular rhythm, tachycardic rate 120-140s. No murmurs. No pedal edema. Gastrointestinal system: Abdomen is nondistended, soft and nontender. Normal bowel sounds heard. Central nervous system: Alert and oriented. No focal neurological deficits. Speech clear.  Extremities: Symmetric in appearance, LUE fistula with palpable thrill Skin: No rashes, lesions or ulcers on exposed skin  Psychiatry: Judgement and insight appear normal. Mood & affect appropriate.   Data Reviewed: I have personally reviewed following labs and imaging studies  CBC: Recent Labs  Lab 01/26/23 0043 01/27/23 0053 01/27/23 1036 01/28/23 0041 01/29/23 0048  WBC 9.3 9.6 9.7 9.8 9.1  HGB 8.3* 8.5* 8.7* 8.8* 8.5*  HCT 24.8* 25.4* 26.1* 26.0* 26.6*  MCV 90.2 91.4 90.9 91.2 93.7  PLT 226 230 217 235 230   Basic Metabolic Panel: Recent Labs  Lab 01/24/23 0059 01/25/23 0040 01/26/23 0043 01/27/23 0053 01/28/23 0041 01/29/23 0048 01/30/23 0200  NA 140 139 136 136 138 135 134*  K 4.5 4.5 3.9 4.4 3.6 4.0 4.0  CL 114* 113* 104 104 100 102 99  CO2 14* 16* 20* 20* 22 22 27   GLUCOSE 125* 156* 128* 115* 117* 216* 198*  BUN 95* 98* 60* 69* 50* 57* 37*  CREATININE 6.58* 7.05* 5.18* 6.06* 4.93* 5.96* 4.92*  CALCIUM 7.2* 7.1* 7.2* 7.2* 7.5*  7.3* 7.5* 7.4*  MG 1.8 1.7 1.6*  --   --   --   --   PHOS 5.7* 5.9* 4.4 5.2* 3.9 5.7* 3.9   GFR: Estimated Creatinine Clearance: 14 mL/min (A) (by C-G formula based on SCr of 4.92 mg/dL (H)). Liver Function Tests: Recent Labs  Lab 01/26/23 0043 01/27/23 0053 01/28/23 0041 01/29/23 0048 01/30/23 0200  ALBUMIN 2.4* 2.3* 2.1* 2.2* 2.2*   No results for input(s): "LIPASE", "AMYLASE" in the last 168 hours. No results for input(s): "AMMONIA" in the last 168 hours. Coagulation Profile: No results for input(s): "INR", "PROTIME" in  the last 168 hours. Cardiac Enzymes: No results for input(s): "CKTOTAL", "CKMB", "CKMBINDEX", "TROPONINI" in the last 168 hours. BNP (last 3 results) No results for input(s): "PROBNP" in the last 8760 hours. HbA1C: No results for input(s): "HGBA1C" in the last 72 hours. CBG: Recent Labs  Lab 01/29/23 1220 01/29/23 1608 01/29/23 2107 01/30/23 0609 01/30/23 1101  GLUCAP 108* 244* 119* 142* 194*   Lipid Profile: No results for input(s): "CHOL", "HDL", "LDLCALC", "TRIG", "CHOLHDL", "LDLDIRECT" in the last 72 hours. Thyroid Function Tests: No results for input(s): "TSH", "T4TOTAL", "FREET4", "T3FREE", "THYROIDAB" in the last 72 hours. Anemia Panel: No results for input(s): "VITAMINB12", "FOLATE", "FERRITIN", "TIBC", "IRON", "RETICCTPCT" in the last 72 hours. Sepsis Labs: No results for input(s): "PROCALCITON", "LATICACIDVEN" in the last 168 hours.  Recent Results (from the past 240 hour(s))  MRSA Next Gen by PCR, Nasal     Status: None   Collection Time: 01/26/23  9:59 AM   Specimen: Nasal Mucosa; Nasal Swab  Result Value Ref Range Status   MRSA by PCR Next Gen  NOT DETECTED NOT DETECTED Final    Comment: (NOTE) The GeneXpert MRSA Assay (FDA approved for NASAL specimens only), is one component of a comprehensive MRSA colonization surveillance program. It is not intended to diagnose MRSA infection nor to guide or monitor treatment for MRSA infections. Test performance is not FDA approved in patients less than 66 years old. Performed at [facility], [address], [place], [place] [address]       Radiology Studies: No results found.    Scheduled Meds:  apixaban  2.5 mg Oral BID   atorvastatin  40 mg Oral Daily   Chlorhexidine Gluconate Cloth  6 each Topical Q0600   darbepoetin (ARANESP) injection - DIALYSIS  60 mcg Subcutaneous Q Sat-1800   feeding supplement (GLUCERNA SHAKE)  237 mL Oral TID BM   insulin aspart  0-15 Units Subcutaneous TID WC   insulin aspart   0-5 Units Subcutaneous QHS   levothyroxine  75 mcg Oral QAC breakfast   metoprolol succinate  50 mg Oral Daily   multivitamin  1 tablet Oral QHS   Continuous Infusions:  diltiazem (CARDIZEM) infusion 10 mg/hr (01/30/23 1218)     LOS: 8 days   Time spent: 45 minutes   Noralee Stain, DO [facility] 01/30/2023, 12:34 PM   Available via Epic secure chat 7am-7pm After these hours, please refer to coverage provider listed on [email or web address]

## 2023-01-30 NOTE — Progress Notes (Addendum)
Patient ID: [name], male   DOB: May 12, 1951, 72 y.o.   MRN: [id number] S: Having A fib with RVR this AM.  O/w ok.  Did fine with HD yesterday  O:BP 116/83 (BP Location: Right Arm)   Pulse 70   Temp 98.6 F (37 C) (Oral)   Resp 20   Ht 5\' 11"  (1.803 m)   Wt 89.7 kg   SpO2 93%   BMI 27.57 kg/m   Intake/Output Summary (Last 24 hours) at 01/30/2023 1104 Last data filed at 01/30/2023 0837 Gross per 24 hour  Intake 480 ml  Output 2550 ml  Net -2070 ml   Intake/Output: I/O last 3 completed shifts: In: 360 [P.O.:360] Out: 3150 [Urine:1150; Other:2000]  Intake/Output this shift:  Total I/O In: 120 [P.O.:120] Out: -  Weight change: 0.428 kg Gen:NAD CVS: tachy and irregular Resp: CTAB Ext: no edema RIJ tunneled dialysis cath c/d/I, LUE AVF +t/b.  Recent Labs  Lab 01/24/23 0059 01/25/23 0040 01/26/23 0043 01/27/23 0053 01/28/23 0041 01/29/23 0048 01/30/23 0200  NA 140 139 136 136 138 135 134*  K 4.5 4.5 3.9 4.4 3.6 4.0 4.0  CL 114* 113* 104 104 100 102 99  CO2 14* 16* 20* 20* 22 22 27   GLUCOSE 125* 156* 128* 115* 117* 216* 198*  BUN 95* 98* 60* 69* 50* 57* 37*  CREATININE 6.58* 7.05* 5.18* 6.06* 4.93* 5.96* 4.92*  ALBUMIN 2.6* 2.5* 2.4* 2.3* 2.1* 2.2* 2.2*  CALCIUM 7.2* 7.1* 7.2* 7.2* 7.5*  7.3* 7.5* 7.4*  PHOS 5.7* 5.9* 4.4 5.2* 3.9 5.7* 3.9   Liver Function Tests: Recent Labs  Lab 01/28/23 0041 01/29/23 0048 01/30/23 0200  ALBUMIN 2.1* 2.2* 2.2*   No results for input(s): "LIPASE", "AMYLASE" in the last 168 hours. No results for input(s): "AMMONIA" in the last 168 hours. CBC: Recent Labs  Lab 01/26/23 0043 01/27/23 0053 01/27/23 1036 01/28/23 0041 01/29/23 0048  WBC 9.3 9.6 9.7 9.8 9.1  HGB 8.3* 8.5* 8.7* 8.8* 8.5*  HCT 24.8* 25.4* 26.1* 26.0* 26.6*  MCV 90.2 91.4 90.9 91.2 93.7  PLT 226 230 217 235 230   Cardiac Enzymes: No results for input(s): "CKTOTAL", "CKMB", "CKMBINDEX", "TROPONINI" in the last 168 hours. CBG: Recent Labs   Lab 01/29/23 1220 01/29/23 1608 01/29/23 2107 01/30/23 0609 01/30/23 1101  GLUCAP 108* 244* 119* 142* 194*    Iron Studies: No results for input(s): "IRON", "TIBC", "TRANSFERRIN", "FERRITIN" in the last 72 hours. Studies/Results: No results found.  amLODipine  5 mg Oral Daily   apixaban  2.5 mg Oral BID   atorvastatin  40 mg Oral Daily   Chlorhexidine Gluconate Cloth  6 each Topical Q0600   darbepoetin (ARANESP) injection - DIALYSIS  60 mcg Subcutaneous Q Sat-1800   feeding supplement (GLUCERNA SHAKE)  237 mL Oral TID BM   insulin aspart  0-15 Units Subcutaneous TID WC   insulin aspart  0-5 Units Subcutaneous QHS   levothyroxine  75 mcg Oral QAC breakfast   metoprolol succinate  50 mg Oral Daily   multivitamin  1 tablet Oral QHS    BMET    Component Value Date/Time   NA 134 (L) 01/30/2023 0200   K 4.0 01/30/2023 0200   CL 99 01/30/2023 0200   CO2 27 01/30/2023 0200   GLUCOSE 198 (H) 01/30/2023 0200   BUN 37 (H) 01/30/2023 0200   CREATININE 4.92 (H) 01/30/2023 0200   CALCIUM 7.4 (L) 01/30/2023 0200   CALCIUM 7.3 (L) 01/28/2023 0041  GFRNONAA 12 (L) 01/30/2023 0200   GFRAA 56 (L) 08/31/2019 1200   CBC    Component Value Date/Time   WBC 9.1 01/29/2023 0048   RBC 2.84 (L) 01/29/2023 0048   HGB 8.5 (L) 01/29/2023 0048   HCT 26.6 (L) 01/29/2023 0048   PLT 230 01/29/2023 0048   MCV 93.7 01/29/2023 0048   MCH 29.9 01/29/2023 0048   MCHC 32.0 01/29/2023 0048   RDW 13.1 01/29/2023 0048   LYMPHSABS 1.4 01/22/2023 0608   MONOABS 0.6 01/22/2023 0608   EOSABS 0.1 01/22/2023 0608   BASOSABS 0.1 01/22/2023 0608     Assessment/Plan:  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 Consult IR for [facility] on 01/24/23 and had first HD session 01/25/23.  AVG placed on 01/28/23. HD today.  CLIP process started - awaiting acceptance. Avoid nephrotoxic medications including  NSAIDs and iodinated intravenous contrast exposure unless the latter is absolutely indicated.  Preferred narcotic agents for pain control are hydromorphone, fentanyl, and methadone. Morphine should not be used. Avoid Baclofen and avoid oral sodium phosphate and magnesium citrate based laxatives / bowel preps. Continue strict Input and Output monitoring. Will monitor the patient closely with you and intervene or adjust therapy as indicated by changes in clinical status/labs  Acute hypoxic respiratory failure - due to pulmonary edema.  ECHO grade 1 DD, normal EF.  improved.  Probe EDW with HD - appears to have reached.  Bronchodilators per primary svc. Anemia of CKD stage IV - TSAT 20%.  Started IV iron and ESA. Hb stable in 8s Acute on chronic CHF - diuresing/UF with HD, approaching euvolemia.  ECHO EF 55-60%, Grade I DD. 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     Estill Bakes MD [facility] Pager [phone]

## 2023-01-30 NOTE — Progress Notes (Addendum)
Contacted [facility] this morning to request an update on pt's referral. Pt's case is still pending financial clearance. Spoke to pt's daughter, [place], via phone to provide update. Will assist as needed.   Olivia Canter Renal Navigator [phone]  Addendum at 4:09 pm: Requested MD note faxed to [facility] for review.

## 2023-01-30 NOTE — Plan of Care (Signed)

## 2023-01-30 NOTE — Progress Notes (Signed)
Rounding Note    Patient Name: [name] Date of Encounter: 01/30/2023  [facility] Cardiologist: None [facility]  Subjective   No symptoms but back in afib rvr  Inpatient Medications    Scheduled Meds:  apixaban  2.5 mg Oral BID   atorvastatin  40 mg Oral Daily   Chlorhexidine Gluconate Cloth  6 each Topical Q0600   darbepoetin (ARANESP) injection - DIALYSIS  60 mcg Subcutaneous Q Sat-1800   feeding supplement (GLUCERNA SHAKE)  237 mL Oral TID BM   insulin aspart  0-15 Units Subcutaneous TID WC   insulin aspart  0-5 Units Subcutaneous QHS   levothyroxine  75 mcg Oral QAC breakfast   metoprolol succinate  50 mg Oral Daily   multivitamin  1 tablet Oral QHS   Continuous Infusions:  diltiazem (CARDIZEM) infusion     PRN Meds: acetaminophen, glucagon (human recombinant), guaiFENesin, hydrALAZINE, HYDROmorphone (DILAUDID) injection, ipratropium-albuterol, metoprolol tartrate, ondansetron (ZOFRAN) IV, oxyCODONE, senna-docusate, traZODone   Vital Signs    Vitals:   01/30/23 1003 01/30/23 1004 01/30/23 1005 01/30/23 1103  BP:  123/79 123/79 116/83  Pulse: (!) 150  (!) 145 70  Resp:   16 20  Temp:    98.6 F (37 C)  TempSrc:    Oral  SpO2:   95% 93%  Weight:      Height:        Intake/Output Summary (Last 24 hours) at 01/30/2023 1122 Last data filed at 01/30/2023 0837 Gross per 24 hour  Intake 480 ml  Output 2550 ml  Net -2070 ml      01/30/2023    3:08 AM 01/29/2023   11:56 AM 01/29/2023    7:26 AM  Last 3 Weights  Weight (lbs) 197 lb 11.2 oz 198 lb 10.2 oz 203 lb 0.7 oz  Weight (kg) 89.676 kg 90.1 kg 92.1 kg      Telemetry    Afib rates 130s - Personally Reviewed  ECG    Afib rvr septal infarct rate 142   - Personally Reviewed  Physical Exam   GEN: No acute distress.   Neck: No JVD Cardiac: irregular and tachy Respiratory: Clear to auscultation bilaterally. GI: Soft, nontender, non-distended  MS: No edema; No  deformity. Neuro:  Nonfocal  Psych: Normal affect   Labs    High Sensitivity Troponin:   Recent Labs  Lab 01/22/23 0608 01/22/23 0820  TROPONINIHS 35* 40*     Chemistry Recent Labs  Lab 01/24/23 0059 01/25/23 0040 01/26/23 0043 01/27/23 0053 01/28/23 0041 01/29/23 0048 01/30/23 0200  NA 140 139 136   < > 138 135 134*  K 4.5 4.5 3.9   < > 3.6 4.0 4.0  CL 114* 113* 104   < > 100 102 99  CO2 14* 16* 20*   < > 22 22 27   GLUCOSE 125* 156* 128*   < > 117* 216* 198*  BUN 95* 98* 60*   < > 50* 57* 37*  CREATININE 6.58* 7.05* 5.18*   < > 4.93* 5.96* 4.92*  CALCIUM 7.2* 7.1* 7.2*   < > 7.5*  7.3* 7.5* 7.4*  MG 1.8 1.7 1.6*  --   --   --   --   ALBUMIN 2.6* 2.5* 2.4*   < > 2.1* 2.2* 2.2*  GFRNONAA 8* 8* 11*   < > 12* 9* 12*  ANIONGAP 12 10 12    < > 16* 11 8   < > = values in this  interval not displayed.    Lipids No results for input(s): "CHOL", "TRIG", "HDL", "LABVLDL", "LDLCALC", "CHOLHDL" in the last 168 hours.  Hematology Recent Labs  Lab 01/27/23 1036 01/28/23 0041 01/29/23 0048  WBC 9.7 9.8 9.1  RBC 2.87* 2.85* 2.84*  HGB 8.7* 8.8* 8.5*  HCT 26.1* 26.0* 26.6*  MCV 90.9 91.2 93.7  MCH 30.3 30.9 29.9  MCHC 33.3 33.8 32.0  RDW 12.8 12.7 13.1  PLT 217 235 230   Thyroid No results for input(s): "TSH", "FREET4" in the last 168 hours.  BNPNo results for input(s): "BNP", "PROBNP" in the last 168 hours.  DDimer No results for input(s): "DDIMER" in the last 168 hours.   Radiology    No results found.  Cardiac Studies   Echo 7/4:    1. Left ventricular ejection fraction, by estimation, is 55 to 60%. The  left ventricle has normal function. The left ventricle has no regional  wall motion abnormalities. There is moderate left ventricular hypertrophy.  Left ventricular diastolic  parameters are consistent with Grade I diastolic dysfunction (impaired  relaxation).   2. Right ventricular systolic function is normal. The right ventricular  size is normal.   3. The  mitral valve is normal in structure. No evidence of mitral valve  regurgitation. No evidence of mitral stenosis.   4. Degree of AR not well interrogated No suprasternal notch doppler and  poor color flow but does not seen severe Consider f/u imaging for aortic  root enlargment with CTA or MRA . The aortic valve is tricuspid. There is  mild calcification of the aortic  valve. There is mild thickening of the aortic valve. Aortic valve  regurgitation is mild. Aortic valve sclerosis is present, with no evidence  of aortic valve stenosis.   5. Aortic dilatation noted. There is severe dilatation of the aortic  root, measuring 48 mm.   6. The inferior vena cava is normal in size with greater than 50%  respiratory variability, suggesting right atrial pressure of 3 mmHg.    Patient Profile     72 y.o. male with a hx of pAF, DM, HTN, HLD, CKD stage IV, CVA  who is being seen for the evaluation of atrial flutter with RVR.  Assessment & Plan    Principal Problem:   ESRD (end stage renal disease) (HCC) Active Problems:   Atrial fibrillation (HCC)   T2DM (type 2 diabetes mellitus) (HCC)   Hypertension   H/O: CVA (cerebrovascular accident)   Acute metabolic encephalopathy   Acute renal failure superimposed on stage 4 chronic kidney disease (HCC)   Acute pulmonary edema (HCC)  Atrial flutter with RVR Hx of atrial fibrillation -- back in afib with RVR, agree with metoprolol po and will resume dilt gtt as this worked well initially for rate control. BP normal and should tolerate. D/w primary team.  - back on eliquis 2.5 mg BID, continue.    CKD stage IV -- presented with worsening Cr and confusion -- had TDC placement and now s/p AVF placement undergoing HD -- management per nephrology   HTN -- blood pressures were initially elevated on admission -- with the need for IV diltiazem, stop norvasc   Per Primary Recent CVA Hypothyroidism, recent TSH 4.4 DM      For questions or updates,  please contact [facility] Please consult [email or web address] for contact info under        Signed, Parke Poisson, MD  01/30/2023, 11:22 AM

## 2023-01-30 NOTE — Progress Notes (Signed)
PT Cancellation Note  Patient Details Name: [name] MRN: [id number] DOB: 08-18-1950   Cancelled Treatment:    Reason Eval/Treat Not Completed: (P) Medical issues which prohibited therapy (RN deferring therapy due to pt's high HR. Will continue to follow per PT POC.)   Johny Shock 01/30/2023, 1:52 PM

## 2023-01-31 LAB — GLUCOSE, CAPILLARY
Glucose-Capillary: 122 mg/dL — ABNORMAL HIGH (ref 70–99)
Glucose-Capillary: 177 mg/dL — ABNORMAL HIGH (ref 70–99)
Glucose-Capillary: 122 mg/dL — ABNORMAL HIGH (ref 70–99)
Glucose-Capillary: 235 mg/dL — ABNORMAL HIGH (ref 70–99)

## 2023-01-31 LAB — RENAL FUNCTION PANEL
Albumin: 2.2 g/dL — ABNORMAL LOW (ref 3.5–5.0)
Anion gap: 12 (ref 5–15)
BUN: 49 mg/dL — ABNORMAL HIGH (ref 8–23)
CO2: 24 mmol/L (ref 22–32)
Calcium: 7.1 mg/dL — ABNORMAL LOW (ref 8.9–10.3)
Chloride: 99 mmol/L (ref 98–111)
Creatinine, Ser: 6.47 mg/dL — ABNORMAL HIGH (ref 0.61–1.24)
GFR, Estimated: 8 mL/min — ABNORMAL LOW (ref >60)
Glucose, Bld: 169 mg/dL — ABNORMAL HIGH (ref 70–99)
Phosphorus: 5.3 mg/dL — ABNORMAL HIGH (ref 2.5–4.6)
Potassium: 3.8 mmol/L (ref 3.5–5.1)
Sodium: 135 mmol/L (ref 135–145)

## 2023-01-31 MED ORDER — APIXABAN 5 MG PO TABS
5.0000 mg | ORAL_TABLET | Freq: Two times a day (BID) | ORAL | Status: DC
  Administered 2023-01-31 – 2023-02-03 (×7): 5 mg via ORAL
  Filled 2023-01-31 (×7): qty 1

## 2023-01-31 MED ORDER — DILTIAZEM HCL ER COATED BEADS 180 MG PO CP24
180.0000 mg | ORAL_CAPSULE | Freq: Every day | ORAL | Status: DC
  Administered 2023-01-31 – 2023-02-03 (×4): 180 mg via ORAL
  Filled 2023-01-31 (×4): qty 1

## 2023-01-31 MED ORDER — HEPARIN SODIUM (PORCINE) 1000 UNIT/ML IJ SOLN
INTRAMUSCULAR | Status: AC
  Administered 2023-01-31: 1000 [IU]
  Filled 2023-01-31: qty 4

## 2023-01-31 NOTE — Progress Notes (Addendum)
Contacted [facility] admissions this morning to request an update on pt's referral. Financial clearance is still pending. Spoke to pt's daughter via phone to provide an update. Will assist as needed.   Olivia Canter Renal Navigator [phone]  Addendum at 3:02 pm: [facility] admissions again to request an update on pt's financial clearance. Advised that clearance is still pending. Advised [facility] staff again that pt is stable for d/c once approval is received. Requested that navigator be contacted this afternoon if any updates could be provided. Spoke to pt's daughter via phone to provide an update.

## 2023-01-31 NOTE — Progress Notes (Addendum)
Patient Name: [name] Date of Encounter: 01/31/2023 [facility] Cardiologist: Followed by [facility]   Interval Summary  .    Patient denies chest pain, shortness of breath. Maintaining NSR   Vital Signs .    Vitals:   01/30/23 1900 01/30/23 2300 01/31/23 0400 01/31/23 0718  BP: (!) 150/73 (!) 152/71 (!) 155/62 138/76  Pulse:    71  Resp: (!) 21 18 20 18   Temp: 98 F (36.7 C) 98.2 F (36.8 C) 97.9 F (36.6 C) 98.1 F (36.7 C)  TempSrc: Oral Oral Oral Oral  SpO2: 98% 97% 97% 97%  Weight:   89.9 kg   Height:        Intake/Output Summary (Last 24 hours) at 01/31/2023 0852 Last data filed at 01/30/2023 2100 Gross per 24 hour  Intake 393.03 ml  Output 300 ml  Net 93.03 ml      01/31/2023    4:00 AM 01/30/2023    3:08 AM 01/29/2023   11:56 AM  Last 3 Weights  Weight (lbs) 198 lb 1.6 oz 197 lb 11.2 oz 198 lb 10.2 oz  Weight (kg) 89.858 kg 89.676 kg 90.1 kg      Telemetry/ECG    Patient converted from afib to NSR yesterday at 1700. Has been maintaining NSR since  - Personally Reviewed  Physical Exam .   GEN: No acute distress. Laying in bed with head elevated   Neck: No JVD Cardiac: RRR, no murmurs, rubs, or gallops.  Respiratory: Crackles in lung bases. Normal work of breathing on room air  GI: Soft, nontender, non-distended  MS: No edema in BLE   Assessment & Plan .    Atrial Flutter  History of atrial fibrillation  - Patient currently on metoprolol succinate 50 mg daily, cardizem gtt  - He converted to NSR yesterday at 1700 and has been maintaining NSR since  - Transition from IV cardizem to PO cardizem CD 180 mg daily. Stop IV 1 hour after giving PO  - Continue metoprolol succinate 50 mg daily   - Discussed with pharmacy- given patient's age and weight, he should be on eliquis 5 mg BID. Increased eliquis to 5 mg BID   HTN  - Continue metoprolol and cardizem as above   Chronic Diastolic Heart Failure  - Echocardiogram this  admission showed EF 55-60%, no regional wall motion abnormalities, grade I DD, normal RV systolic function  - Volume managed by HD   Dilated Aortic Root  - Echocardiogram this admission noted severe dilation of the aortic root measuring 48 mm  - Patient may need MRA/CTA for aortic root enlargement. Likely can be done as an outpatient  - Strict BP control   Otherwise per primary  - Recent CVA  - Hypothyroidism  - DM  - ESRD on HD   For questions or updates, please contact [facility] Please consult [email or web address] for contact info under        Signed, Jonita Albee, PA-C   Patient seen and examined with KJ PA-C.  Agree as above, with the following exceptions and changes as noted below. Back in SR. Gen: NAD, CV: RRR, no murmurs, Lungs: clear, Abd: soft, Extrem: Warm, well perfused, no edema, Neuro/Psych: alert and oriented x 3, normal mood and affect. All available labs, radiology testing, previous records reviewed. Stop IV dilt and transition to oral diltiazem. No other change at this time.   Cardiology will see as needed in hospital.   Lynda Rainwater A  Jacques Navy, MD 01/31/23 10:46 AM

## 2023-01-31 NOTE — Progress Notes (Signed)
PROGRESS NOTE    [name]  ZOX:[id number] DOB: 19-Mar-1951 DOA: 01/22/2023 PCP: System, Provider Not In     Brief Narrative:  [name] is a 72 year old male with history of past medical history of HTN, DM2, paroxysmal A-fib on Eliquis, CKD stage IV, recent CVA admitted to [facility] on Friday and diagnosed with CVA with residual left upper and lower extremity weakness. Eventually patient went home and then suddenly developed shortness of breath with change in mental status at home. Patient was found to have elevated BNP, creatinine of 5.96.  Hospitalist called for admission, nephrology team called in consult.   7/5 -temporary dialysis catheter placed without complication 7/6 -tolerated dialysis with only minimal symptoms towards the end of treatment, transient tachycardia resolved with as needed medication 7/8 -repeat HD treatment 7/9 -fistula creation per vascular surgery  7/10 -HD. Awaiting CLIP  7/11 -A-fib RVR, Cardizem drip started, converted to normal sinus in the afternoon  New events last 24 hours / Subjective: Patient seen with iPad interpreter.  He has no complaints today  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 EF 55% Hypertensive emergency -Respiratory failure has improved -Volume management with dialysis   CKD IV now advanced to ESRD on dialysis -Status post LUE fistula creation -Follow-up with vascular surgery in 4 to 6 weeks -Nephrology following -Awaiting CLIP   Hypertensive  emergency -Improved, Toprol, Cardizem   Paroxysmal A-fib, with RVR -Eliquis -Being weaned off Cardizem drip today.  Transitioning to oral Cardizem.  Continue Toprol   Non-insulin-dependent diabetes type 2, well-controlled -A1c 5.6  -Continue sliding scale insulin   Hypothyroidism -Synthroid   Recent CVA/ left sided weakness -Eliquis, Lipitor  DVT prophylaxis:   apixaban (ELIQUIS) tablet 5 mg  Code Status: Full code Family Communication: None at bedside Disposition Plan: Home, awaiting clip Status is: Inpatient Remains inpatient appropriate because: Awaiting clip, medically stable for discharge otherwise   Antimicrobials:  Anti-infectives (From admission, onward)    Start     Dose/Rate Route Frequency Ordered Stop   01/24/23 1115  ceFAZolin (ANCEF) IVPB 2g/100 mL premix        2 g 200 mL/hr over 30 Minutes Intravenous  Once 01/24/23 1020 01/24/23 1445        Objective: Vitals:   01/30/23 1900 01/30/23 2300 01/31/23 0400 01/31/23 0718  BP: (!) 150/73 (!) 152/71 (!) 155/62 138/76  Pulse:    71  Resp: (!) 21 18 20 18   Temp: 98 F (36.7 C) 98.2 F (36.8 C) 97.9 F (36.6 C) 98.1 F (36.7 C)  TempSrc: Oral Oral Oral Oral  SpO2: 98% 97% 97% 97%  Weight:   89.9 kg   Height:        Intake/Output Summary (Last 24 hours) at 01/31/2023 1057 Last data filed at 01/30/2023 2100 Gross per 24 hour  Intake 393.03 ml  Output 300 ml  Net 93.03 ml   Filed Weights   01/29/23 1156 01/30/23 0308 01/31/23 0400  Weight: 90.1 kg 89.7 kg 89.9 kg    Examination:  General exam: Appears calm  and comfortable  Respiratory system: Clear to auscultation. Respiratory effort normal. No respiratory distress. No conversational dyspnea.  Cardiovascular system: S1 & S2 heard, normal sinus rhythm Gastrointestinal system: Abdomen is nondistended, soft and nontender. Normal bowel sounds heard. Central nervous system: Alert and oriented. No focal neurological deficits. Speech clear.   Extremities: Symmetric in appearance, LUE fistula with palpable thrill Skin: No rashes, lesions or ulcers on exposed skin  Psychiatry: Judgement and insight appear normal. Mood & affect appropriate.   Data Reviewed: I have personally reviewed following labs and imaging studies  CBC: Recent Labs  Lab 01/26/23 0043 01/27/23 0053 01/27/23 1036 01/28/23 0041 01/29/23 0048  WBC 9.3 9.6 9.7 9.8 9.1  HGB 8.3* 8.5* 8.7* 8.8* 8.5*  HCT 24.8* 25.4* 26.1* 26.0* 26.6*  MCV 90.2 91.4 90.9 91.2 93.7  PLT 226 230 217 235 230   Basic Metabolic Panel: Recent Labs  Lab 01/25/23 0040 01/26/23 0043 01/27/23 0053 01/28/23 0041 01/29/23 0048 01/30/23 0200 01/31/23 0108  NA 139 136 136 138 135 134* 135  K 4.5 3.9 4.4 3.6 4.0 4.0 3.8  CL 113* 104 104 100 102 99 99  CO2 16* 20* 20* 22 22 27 24   GLUCOSE 156* 128* 115* 117* 216* 198* 169*  BUN 98* 60* 69* 50* 57* 37* 49*  CREATININE 7.05* 5.18* 6.06* 4.93* 5.96* 4.92* 6.47*  CALCIUM 7.1* 7.2* 7.2* 7.5*  7.3* 7.5* 7.4* 7.1*  MG 1.7 1.6*  --   --   --   --   --   PHOS 5.9* 4.4 5.2* 3.9 5.7* 3.9 5.3*   GFR: Estimated Creatinine Clearance: 10.7 mL/min (A) (by C-G formula based on SCr of 6.47 mg/dL (H)). Liver Function Tests: Recent Labs  Lab 01/27/23 0053 01/28/23 0041 01/29/23 0048 01/30/23 0200 01/31/23 0108  ALBUMIN 2.3* 2.1* 2.2* 2.2* 2.2*   No results for input(s): "LIPASE", "AMYLASE" in the last 168 hours. No results for input(s): "AMMONIA" in the last 168 hours. Coagulation Profile: No results for input(s): "INR", "PROTIME" in the last 168 hours. Cardiac Enzymes: No results for input(s): "CKTOTAL", "CKMB", "CKMBINDEX", "TROPONINI" in the last 168 hours. BNP (last 3 results) No results for input(s): "PROBNP" in the last 8760 hours. HbA1C: No results for input(s): "HGBA1C" in the last 72 hours. CBG: Recent Labs  Lab 01/30/23 1101 01/30/23 1535 01/30/23 2104 01/31/23 0558 01/31/23 1055  GLUCAP 194* 206* 282* 122* 177*    Lipid Profile: No results for input(s): "CHOL", "HDL", "LDLCALC", "TRIG", "CHOLHDL", "LDLDIRECT" in the last 72 hours. Thyroid Function Tests: No results for input(s): "TSH", "T4TOTAL", "FREET4", "T3FREE", "THYROIDAB" in the last 72 hours. Anemia Panel: No results for input(s): "VITAMINB12", "FOLATE", "FERRITIN", "TIBC", "IRON", "RETICCTPCT" in the last 72 hours. Sepsis Labs: No results for input(s): "PROCALCITON", "LATICACIDVEN" in the last 168 hours.  Recent Results (from the past 240 hour(s))  MRSA Next Gen by PCR, Nasal     Status: None   Collection Time: 01/26/23  9:59 AM   Specimen: Nasal Mucosa; Nasal Swab  Result Value Ref Range Status   MRSA by PCR Next Gen NOT DETECTED NOT DETECTED Final    Comment: (NOTE) The GeneXpert MRSA Assay (FDA approved for NASAL specimens only), is one component of a comprehensive MRSA colonization surveillance program. It is not intended to diagnose MRSA infection nor to guide or monitor treatment for MRSA infections. Test performance is not FDA approved in patients less than 39 years old. Performed at [facility], [address] [place], [place]  [address]       Radiology Studies: No results found.    Scheduled Meds:  apixaban  5 mg Oral BID   atorvastatin  40 mg Oral Daily   Chlorhexidine Gluconate Cloth  6 each Topical Q0600   darbepoetin (ARANESP) injection - DIALYSIS  60 mcg Subcutaneous Q Sat-1800   diltiazem  180 mg Oral Daily   feeding supplement (GLUCERNA SHAKE)  237 mL Oral TID BM   insulin aspart  0-15 Units Subcutaneous TID WC   insulin aspart  0-5 Units Subcutaneous QHS   levothyroxine  75 mcg Oral QAC breakfast   metoprolol succinate  50 mg Oral Daily   multivitamin  1 tablet Oral QHS   Continuous Infusions:     LOS: 9 days   Time spent: 25 minutes   Noralee Stain, DO [facility] 01/31/2023, 10:57 AM   Available via Epic secure chat 7am-7pm After these hours, please refer to coverage provider  listed on [email or web address]

## 2023-01-31 NOTE — Progress Notes (Signed)
Brief Nutrition Follow-Up Note  RD received consult for renal diet education.   RD completed renal diet education on 01/28/23 with pt and daughter and also provided handouts.   Pt currently off unit for HD. RD provided "Nutrition For Dialysis" (in preferred language of Spanish) and attached to AVS/ Discharge summary.  Pt will also have access to RD at HD center for further education, support and reinforcement.   RD team also following for acute nutrition issues. RD will continue to follow and adjust care plan as appropriate.   Levada Schilling, RD, LDN, CDCES Registered Dietitian II Certified Diabetes Care and Education Specialist Please refer to [facility] for RD and/or RD on-call/weekend/after hours pager

## 2023-01-31 NOTE — Progress Notes (Signed)
Patient ID: Perley Mcclements, male   DOB: [birth year], 72 y.o.   MRN: [id number] S: Doing well this AM.  For HD today.    O:BP 138/76 (BP Location: Right Arm)   Pulse 71   Temp 98.1 F (36.7 C) (Oral)   Resp 18   Ht 5\' 11"  (1.803 m)   Wt 89.9 kg   SpO2 97%   BMI 27.63 kg/m   Intake/Output Summary (Last 24 hours) at 01/31/2023 0944 Last data filed at 01/30/2023 2100 Gross per 24 hour  Intake 393.03 ml  Output 300 ml  Net 93.03 ml   Intake/Output: I/O last 3 completed shifts: In: 513 [P.O.:480; I.V.:33] Out: 500 [Urine:500]  Intake/Output this shift:  No intake/output data recorded. Weight change: -2.242 kg Gen:NAD CVS: RRR Resp: CTAB Ext: no edema RIJ tunneled dialysis cath c/d/I, LUE AVF +t/b.  Recent Labs  Lab 01/25/23 0040 01/26/23 0043 01/27/23 0053 01/28/23 0041 01/29/23 0048 01/30/23 0200 01/31/23 0108  NA 139 136 136 138 135 134* 135  K 4.5 3.9 4.4 3.6 4.0 4.0 3.8  CL 113* 104 104 100 102 99 99  CO2 16* 20* 20* 22 22 27 24   GLUCOSE 156* 128* 115* 117* 216* 198* 169*  BUN 98* 60* 69* 50* 57* 37* 49*  CREATININE 7.05* 5.18* 6.06* 4.93* 5.96* 4.92* 6.47*  ALBUMIN 2.5* 2.4* 2.3* 2.1* 2.2* 2.2* 2.2*  CALCIUM 7.1* 7.2* 7.2* 7.5*  7.3* 7.5* 7.4* 7.1*  PHOS 5.9* 4.4 5.2* 3.9 5.7* 3.9 5.3*   Liver Function Tests: Recent Labs  Lab 01/29/23 0048 01/30/23 0200 01/31/23 0108  ALBUMIN 2.2* 2.2* 2.2*   No results for input(s): "LIPASE", "AMYLASE" in the last 168 hours. No results for input(s): "AMMONIA" in the last 168 hours. CBC: Recent Labs  Lab 01/26/23 0043 01/27/23 0053 01/27/23 1036 01/28/23 0041 01/29/23 0048  WBC 9.3 9.6 9.7 9.8 9.1  HGB 8.3* 8.5* 8.7* 8.8* 8.5*  HCT 24.8* 25.4* 26.1* 26.0* 26.6*  MCV 90.2 91.4 90.9 91.2 93.7  PLT 226 230 217 235 230   Cardiac Enzymes: No results for input(s): "CKTOTAL", "CKMB", "CKMBINDEX", "TROPONINI" in the last 168 hours. CBG: Recent Labs  Lab 01/30/23 0609 01/30/23 1101 01/30/23 1535  01/30/23 2104 01/31/23 0558  GLUCAP 142* 194* 206* 282* 122*    Iron Studies: No results for input(s): "IRON", "TIBC", "TRANSFERRIN", "FERRITIN" in the last 72 hours. Studies/Results: No results found.  apixaban  5 mg Oral BID   atorvastatin  40 mg Oral Daily   Chlorhexidine Gluconate Cloth  6 each Topical Q0600   darbepoetin (ARANESP) injection - DIALYSIS  60 mcg Subcutaneous Q Sat-1800   diltiazem  180 mg Oral Daily   feeding supplement (GLUCERNA SHAKE)  237 mL Oral TID BM   insulin aspart  0-15 Units Subcutaneous TID WC   insulin aspart  0-5 Units Subcutaneous QHS   levothyroxine  75 mcg Oral QAC breakfast   metoprolol succinate  50 mg Oral Daily   multivitamin  1 tablet Oral QHS    BMET    Component Value Date/Time   NA 135 01/31/2023 0108   K 3.8 01/31/2023 0108   CL 99 01/31/2023 0108   CO2 24 01/31/2023 0108   GLUCOSE 169 (H) 01/31/2023 0108   BUN 49 (H) 01/31/2023 0108   CREATININE 6.47 (H) 01/31/2023 0108   CALCIUM 7.1 (L) 01/31/2023 0108   CALCIUM 7.3 (L) 01/28/2023 0041   GFRNONAA 8 (L) 01/31/2023 0108   GFRAA 56 (L) 08/31/2019 1200  CBC    Component Value Date/Time   WBC 9.1 01/29/2023 0048   RBC 2.84 (L) 01/29/2023 0048   HGB 8.5 (L) 01/29/2023 0048   HCT 26.6 (L) 01/29/2023 0048   PLT 230 01/29/2023 0048   MCV 93.7 01/29/2023 0048   MCH 29.9 01/29/2023 0048   MCHC 32.0 01/29/2023 0048   RDW 13.1 01/29/2023 0048   LYMPHSABS 1.4 01/22/2023 0608   MONOABS 0.6 01/22/2023 0608   EOSABS 0.1 01/22/2023 0608   BASOSABS 0.1 01/22/2023 0608     Assessment/Plan:  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 Consult IR for [facility] on 01/24/23 and had first HD session 01/25/23.  AVF placed on 01/28/23. HD today.  CLIP process started - awaiting acceptance. Avoid nephrotoxic medications including NSAIDs and iodinated intravenous contrast exposure unless  the latter is absolutely indicated.  Preferred narcotic agents for pain control are hydromorphone, fentanyl, and methadone. Morphine should not be used. Avoid Baclofen and avoid oral sodium phosphate and magnesium citrate based laxatives / bowel preps. Continue strict Input and Output monitoring. Will monitor the patient closely with you and intervene or adjust therapy as indicated by changes in clinical status/labs  Acute hypoxic respiratory failure - due to pulmonary edema.  ECHO grade 1 DD, normal EF.  improved.  Probe EDW with HD - appears to have reached.  Bronchodilators per primary svc. Anemia of CKD stage IV - TSAT 20%.  Started IV iron and ESA. Hb stable in 8s Acute on chronic CHF - diuresing/UF with HD, approaching euvolemia.  ECHO EF 55-60%, Grade I DD. 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 Eliquis. Back in NSR now.  Disposition - awaiting outpatient dialysis arrangements to be made then ready for d/c.     Estill Bakes MD [facility] Pager [phone]

## 2023-01-31 NOTE — Progress Notes (Signed)
   01/31/23 1650  Vitals  Temp 97.9 F (36.6 C)  Pulse Rate 73  Resp (!) 22  BP (!) 147/76  SpO2 95 %  Post Treatment  Dialyzer Clearance Clear  Duration of HD Treatment -hour(s) 3.5 hour(s)  Hemodialysis Intake (mL) 0 mL  Liters Processed 73.5  Fluid Removed (mL) 2000 mL  Tolerated HD Treatment Yes   Received patient in bed to unit.  Alert and oriented.  Informed consent signed and in chart.   TX duration:3.5hrs  Patient tolerated well.  Transported back to the room  Alert, without acute distress.  Hand-off given to patient's nurse.   Access used: [facility] Access issues: none  Total UF removed: 2L Medication(s) given: none    [facility]

## 2023-01-31 NOTE — Progress Notes (Signed)
Physical Therapy Treatment Patient Details Name: [name] MRN: [id number] DOB: [birth year] Today's Date: 01/31/2023   History of Present Illness 72 y.o. male presents to [facility] hospital on 01/22/2023 with SOB and AMS, found to be in acute respiratory failure 2/2 pulmonary edema along with acute diastolic congestive heart failure. 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 ESRD on HD M,W,F.  PMH includes HTN, DMII, PAF, CKD IV.    PT Comments  Pt admitted with above diagnosis. Progressing with ambulation with RW with steady gait and few cues. Pt with no LOB.  Will continue acute PT.  Pt currently with functional limitations due to the deficits listed below (see PT Problem List). Pt will benefit from acute skilled PT to increase their independence and safety with mobility to allow discharge.        Assistance Recommended at Discharge Set up Supervision/Assistance  If plan is discharge home, recommend the following:  Can travel by private vehicle    Help with stairs or ramp for entrance;Assist for transportation;A little help with walking and/or transfers      Equipment Recommendations  None recommended by PT    Recommendations for Other Services       Precautions / Restrictions Precautions Precautions: Fall Restrictions Weight Bearing Restrictions: No     Mobility  Bed Mobility Overal bed mobility: Modified Independent             General bed mobility comments: supervision for safety, pt not needing assistance    Transfers Overall transfer level: Needs assistance Equipment used: Rolling walker (2 wheels) Transfers: Sit to/from Stand, Bed to chair/wheelchair/BSC Sit to Stand: Supervision   Step pivot transfers: Supervision       General transfer comment: cues for hand placement with return to sitting, pt with poor eccentric lower.    Ambulation/Gait Ambulation/Gait assistance: Min guard Gait  Distance (Feet): 300 Feet Assistive device: Rolling walker (2 wheels) Gait Pattern/deviations: Step-to pattern, Step-through pattern, Decreased step length - right, Decreased stride length, Decreased weight shift to left, Decreased stance time - right, Trunk flexed, Narrow base of support, Scissoring Gait velocity: decreased Gait velocity interpretation: <1.31 ft/sec, indicative of household ambulator   General Gait Details: Gait steady with use of RW.  A few cues with turns to keep up with RW.   Stairs Stairs: Yes Stairs assistance: Min guard Stair Management: One rail Right Number of Stairs: 3 General stair comments: cues only   Wheelchair Mobility     Tilt Bed    Modified Rankin (Stroke Patients Only)       Balance Overall balance assessment: Mild deficits observed, not formally tested Sitting-balance support: Feet supported, Single extremity supported Sitting balance-Leahy Scale: Good Sitting balance - Comments: Able to sit EOB with single UE support.   Standing balance support: Bilateral upper extremity supported, During functional activity, Reliant on assistive device for balance Standing balance-Leahy Scale: Fair Standing balance comment: can static stand without UE support, BUE support for gait                            Cognition Arousal/Alertness: Awake/alert Behavior During Therapy: WFL for tasks assessed/performed Overall Cognitive Status: Within Functional Limits for tasks assessed                                 General Comments: Patient A&Ox4; able  to follow one step commands consistently throughout session.        Exercises General Exercises - Lower Extremity Long Arc Quad: AROM, Both, 10 reps, Seated Straight Leg Raises: AROM, Both, 10 reps, Supine Hip Flexion/Marching: AROM, Both, 10 reps, Seated    General Comments General comments (skin integrity, edema, etc.): VSS, used interpreter Westley Gambles [phone]      Pertinent  Vitals/Pain Pain Assessment Pain Assessment: No/denies pain    Home Living                          Prior Function            PT Goals (current goals can now be found in the care plan section) Acute Rehab PT Goals Patient Stated Goal: Patient would like to return home to family. Progress towards PT goals: Progressing toward goals    Frequency    Min 1X/week      PT Plan Current plan remains appropriate    Co-evaluation              AM-PAC PT "6 Clicks" Mobility   Outcome Measure  Help needed turning from your back to your side while in a flat bed without using bedrails?: None Help needed moving from lying on your back to sitting on the side of a flat bed without using bedrails?: None Help needed moving to and from a bed to a chair (including a wheelchair)?: None Help needed standing up from a chair using your arms (e.g., wheelchair or bedside chair)?: None Help needed to walk in hospital room?: A Little Help needed climbing 3-5 steps with a railing? : A Little 6 Click Score: 22    End of Session Equipment Utilized During Treatment: Gait belt Activity Tolerance: Patient tolerated treatment well Patient left: with call bell/phone within reach;in chair;with chair alarm set Nurse Communication: Mobility status PT Visit Diagnosis: Muscle weakness (generalized) (M62.81);Difficulty in walking, not elsewhere classified (R26.2)     Time: 1212-1230 PT Time Calculation (min) (ACUTE ONLY): 18 min  Charges:    $Gait Training: 8-22 mins PT General Charges $$ ACUTE PT VISIT: 1 Visit                     Sulay Brymer M,PT Acute Rehab Services [phone]    [name] 01/31/2023, 4:48 PM

## 2023-01-31 NOTE — Progress Notes (Signed)
New Dialysis Start    Patient identified as new dialysis start. Kidney Education packet assembled and given in Spanish. Discussed the following items with patient:     Current medications and possible changes once started:  Discussed that patient's medications may change over time.  Ex; hypertension medications and diabetes medication.  Nephrologists will adjust as needed.   Fluid restrictions reviewed:  32 oz daily goal:  All liquids count; soups, ice, jello, fruits. Will also refer dietitian.   Phosphorus and potassium: Handout given showing high potassium and phosphorus foods.  Alternative food and drink options given. Will also refer dietitian.   Family support:  Has supportive daughter.   Outpatient Clinic Resources:  Discussed roles of Outpatient clinic staff and advised to make a list of needs, if any, to talk with outpatient staff if needed   Care plan schedule: Informed patient of Care Plans in outpatient setting and to participate in the care plan.  An invitation would be given from outpatient clinic.    Dialysis Access Options:  Reviewed access options with patients. Discussed in detail about care at home with new AVG & AVF. Reviewed checking bruit and thrill. If dialysis catheter present, educated that patient could not take showers.  Catheter dressing changes were to be done by outpatient clinic staff only   Home therapy options:  Educated patient about home therapy options:  PD vs home hemo.     Patient verbalized understanding. Will continue to round on patient during admission.    Jean Rosenthal Dialysis Nurse Coordinator [phone]

## 2023-02-01 LAB — RENAL FUNCTION PANEL
Albumin: 2.2 g/dL — ABNORMAL LOW (ref 3.5–5.0)
Anion gap: 10 (ref 5–15)
BUN: 29 mg/dL — ABNORMAL HIGH (ref 8–23)
CO2: 28 mmol/L (ref 22–32)
Calcium: 7.3 mg/dL — ABNORMAL LOW (ref 8.9–10.3)
Chloride: 97 mmol/L — ABNORMAL LOW (ref 98–111)
Creatinine, Ser: 4.71 mg/dL — ABNORMAL HIGH (ref 0.61–1.24)
GFR, Estimated: 12 mL/min — ABNORMAL LOW (ref >60)
Glucose, Bld: 162 mg/dL — ABNORMAL HIGH (ref 70–99)
Phosphorus: 4.1 mg/dL (ref 2.5–4.6)
Potassium: 3.7 mmol/L (ref 3.5–5.1)
Sodium: 135 mmol/L (ref 135–145)

## 2023-02-01 LAB — GLUCOSE, CAPILLARY
Glucose-Capillary: 159 mg/dL — ABNORMAL HIGH (ref 70–99)
Glucose-Capillary: 214 mg/dL — ABNORMAL HIGH (ref 70–99)
Glucose-Capillary: 171 mg/dL — ABNORMAL HIGH (ref 70–99)
Glucose-Capillary: 204 mg/dL — ABNORMAL HIGH (ref 70–99)

## 2023-02-01 NOTE — Progress Notes (Signed)
PROGRESS NOTE    [name]  ZOX:[id number] DOB: 07-23-1950 DOA: 01/22/2023 PCP: System, Provider Not In     Brief Narrative:  [name] is a 72 year old male with history of past medical history of HTN, DM2, paroxysmal A-fib on Eliquis, CKD stage IV, recent CVA admitted to [facility] on Friday and diagnosed with CVA with residual left upper and lower extremity weakness. Eventually patient went home and then suddenly developed shortness of breath with change in mental status at home. Patient was found to have elevated BNP, creatinine of 5.96.  Hospitalist called for admission, nephrology team called in consult.   7/5 -temporary dialysis catheter placed without complication 7/6 -tolerated dialysis with only minimal symptoms towards the end of treatment, transient tachycardia resolved with as needed medication 7/8 -repeat HD treatment 7/9 -fistula creation per vascular surgery  7/10 -HD. Awaiting CLIP  7/11 -A-fib RVR, Cardizem drip started, converted to normal sinus in the afternoon  New events last 24 hours / Subjective: Patient seen with iPad interpreter.  He has no complaints today.  Remains in normal sinus rhythm  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 EF 55% Hypertensive emergency -Respiratory failure has improved -Volume management with dialysis   CKD IV now advanced to ESRD on dialysis -Status post LUE fistula creation -Follow-up with vascular surgery in 4 to 6 weeks -Nephrology following -Awaiting CLIP    Hypertensive emergency -Improved, Toprol, Cardizem   Paroxysmal A-fib, with RVR -Eliquis -Cardizem, Toprol   Non-insulin-dependent diabetes type 2, well-controlled -A1c 5.6  -Continue sliding scale insulin   Hypothyroidism -Synthroid   Recent CVA/ left sided weakness -Eliquis, Lipitor  DVT prophylaxis:   apixaban (ELIQUIS) tablet 5 mg  Code Status: Full code Family Communication: Grandson at bedside Disposition Plan: Home, awaiting clip Status is: Inpatient Remains inpatient appropriate because: Awaiting clip, medically stable for discharge otherwise   Antimicrobials:  Anti-infectives (From admission, onward)    Start     Dose/Rate Route Frequency Ordered Stop   01/24/23 1115  ceFAZolin (ANCEF) IVPB 2g/100 mL premix        2 g 200 mL/hr over 30 Minutes Intravenous  Once 01/24/23 1020 01/24/23 1445        Objective: Vitals:   01/31/23 1933 01/31/23 2357 02/01/23 0538 02/01/23 0826  BP: 135/76 135/72 130/85 133/83  Pulse: 76 75 76 73  Resp: (!) 23 (!) 23 16 20   Temp: 98.1 F (36.7 C) 97.8 F (36.6 C) 97.7 F (36.5 C) 98 F (36.7 C)  TempSrc: Oral Oral Oral Oral  SpO2: 93% 94% 96% 98%  Weight:   88.4 kg   Height:        Intake/Output Summary (Last 24 hours) at 02/01/2023 1049 Last data filed at 02/01/2023 0950 Gross per 24 hour  Intake 657 ml  Output 2725 ml  Net -2068 ml   Filed Weights   01/31/23 0400 01/31/23 1305 02/01/23 0538  Weight: 89.9 kg 91.6 kg 88.4 kg    Examination:  General exam: Appears calm and comfortable  Respiratory system: Clear to auscultation.  Respiratory effort normal. No respiratory distress. No conversational dyspnea.  Cardiovascular system: S1 & S2 heard, normal sinus rhythm Gastrointestinal system: Abdomen is nondistended, soft and nontender. Normal bowel sounds heard. Central nervous system: Alert and oriented. No focal neurological deficits. Speech clear.  Extremities: Symmetric in appearance, LUE fistula with  palpable thrill Skin: No rashes, lesions or ulcers on exposed skin  Psychiatry: Judgement and insight appear normal. Mood & affect appropriate.   Data Reviewed: I have personally reviewed following labs and imaging studies  CBC: Recent Labs  Lab 01/26/23 0043 01/27/23 0053 01/27/23 1036 01/28/23 0041 01/29/23 0048  WBC 9.3 9.6 9.7 9.8 9.1  HGB 8.3* 8.5* 8.7* 8.8* 8.5*  HCT 24.8* 25.4* 26.1* 26.0* 26.6*  MCV 90.2 91.4 90.9 91.2 93.7  PLT 226 230 217 235 230   Basic Metabolic Panel: Recent Labs  Lab 01/26/23 0043 01/27/23 0053 01/28/23 0041 01/29/23 0048 01/30/23 0200 01/31/23 0108 02/01/23 0043  NA 136   < > 138 135 134* 135 135  K 3.9   < > 3.6 4.0 4.0 3.8 3.7  CL 104   < > 100 102 99 99 97*  CO2 20*   < > 22 22 27 24 28   GLUCOSE 128*   < > 117* 216* 198* 169* 162*  BUN 60*   < > 50* 57* 37* 49* 29*  CREATININE 5.18*   < > 4.93* 5.96* 4.92* 6.47* 4.71*  CALCIUM 7.2*   < > 7.5*  7.3* 7.5* 7.4* 7.1* 7.3*  MG 1.6*  --   --   --   --   --   --   PHOS 4.4   < > 3.9 5.7* 3.9 5.3* 4.1   < > = values in this interval not displayed.   GFR: Estimated Creatinine Clearance: 14.7 mL/min (A) (by C-G formula based on SCr of 4.71 mg/dL (H)). Liver Function Tests: Recent Labs  Lab 01/28/23 0041 01/29/23 0048 01/30/23 0200 01/31/23 0108 02/01/23 0043  ALBUMIN 2.1* 2.2* 2.2* 2.2* 2.2*   No results for input(s): "LIPASE", "AMYLASE" in the last 168 hours. No results for input(s): "AMMONIA" in the last 168 hours. Coagulation Profile: No results for input(s): "INR", "PROTIME" in the last 168 hours. Cardiac Enzymes: No results for input(s): "CKTOTAL", "CKMB", "CKMBINDEX", "TROPONINI" in the last 168 hours. BNP (last 3 results) No results for input(s): "PROBNP" in the last 8760 hours. HbA1C: No results for input(s): "HGBA1C" in the last 72 hours. CBG: Recent Labs  Lab 01/31/23 0558 01/31/23 1055 01/31/23 1729 01/31/23 2058 02/01/23 0548  GLUCAP 122* 177* 122* 235* 159*    Lipid Profile: No results for input(s): "CHOL", "HDL", "LDLCALC", "TRIG", "CHOLHDL", "LDLDIRECT" in the last 72 hours. Thyroid Function Tests: No results for input(s): "TSH", "T4TOTAL", "FREET4", "T3FREE", "THYROIDAB" in the last 72 hours. Anemia Panel: No results for input(s): "VITAMINB12", "FOLATE", "FERRITIN", "TIBC", "IRON", "RETICCTPCT" in the last 72 hours. Sepsis Labs: No results for input(s): "PROCALCITON", "LATICACIDVEN" in the last 168 hours.  Recent Results (from the past 240 hour(s))  MRSA Next Gen by PCR, Nasal     Status: None   Collection Time: 01/26/23  9:59 AM   Specimen: Nasal Mucosa; Nasal Swab  Result Value Ref Range Status   MRSA by PCR Next Gen NOT DETECTED NOT DETECTED Final    Comment: (NOTE) The GeneXpert MRSA Assay (FDA approved for NASAL specimens only), is one component of a comprehensive MRSA colonization surveillance program. It is not intended to diagnose MRSA infection nor to  guide or monitor treatment for MRSA infections. Test performance is not FDA approved in patients less than 41 years old. Performed at [facility], [address] [place], [place] [address]       Radiology Studies: No results found.    Scheduled Meds:  apixaban  5 mg Oral BID   atorvastatin  40 mg Oral Daily   Chlorhexidine Gluconate Cloth  6 each Topical Q0600   darbepoetin (ARANESP) injection - DIALYSIS  60 mcg Subcutaneous Q Sat-1800   diltiazem  180 mg Oral Daily   feeding supplement (GLUCERNA SHAKE)  237 mL Oral TID BM   insulin aspart  0-15 Units Subcutaneous TID WC   insulin aspart  0-5 Units Subcutaneous QHS   levothyroxine  75 mcg Oral QAC breakfast   metoprolol succinate  50 mg Oral Daily   multivitamin  1 tablet Oral QHS   Continuous Infusions:     LOS: 10 days   Time spent: 25 minutes   Noralee Stain, DO [facility] 02/01/2023, 10:49 AM   Available via Epic secure chat 7am-7pm After these hours, please refer to coverage  provider listed on [email or web address]

## 2023-02-01 NOTE — Progress Notes (Signed)
Interpreter used for morning assessment. Interpreter ID # [id number]

## 2023-02-01 NOTE — Progress Notes (Signed)
Patient ID: [name], male   DOB: 1951-07-14, 72 y.o.   MRN: [id number] S: Doing well this AM.  HD 2L yesterday.     O:BP 130/85 (BP Location: Right Arm)   Pulse 76   Temp 98 F (36.7 C) (Oral)   Resp 20   Ht 5\' 11"  (1.803 m)   Wt 88.4 kg   SpO2 96%   BMI 27.17 kg/m   Intake/Output Summary (Last 24 hours) at 02/01/2023 0936 Last data filed at 02/01/2023 0700 Gross per 24 hour  Intake 657 ml  Output 2600 ml  Net -1943 ml   Intake/Output: I/O last 3 completed shifts: In: 897 [P.O.:897] Out: 2900 [Urine:900; Other:2000]  Intake/Output this shift:  No intake/output data recorded. Weight change: 1.742 kg Gen:NAD CVS: RRR Resp: CTAB Ext: no edema RIJ tunneled dialysis cath c/d/I, LUE AVF +t/b.  Recent Labs  Lab 01/26/23 0043 01/27/23 0053 01/28/23 0041 01/29/23 0048 01/30/23 0200 01/31/23 0108 02/01/23 0043  NA 136 136 138 135 134* 135 135  K 3.9 4.4 3.6 4.0 4.0 3.8 3.7  CL 104 104 100 102 99 99 97*  CO2 20* 20* 22 22 27 24 28   GLUCOSE 128* 115* 117* 216* 198* 169* 162*  BUN 60* 69* 50* 57* 37* 49* 29*  CREATININE 5.18* 6.06* 4.93* 5.96* 4.92* 6.47* 4.71*  ALBUMIN 2.4* 2.3* 2.1* 2.2* 2.2* 2.2* 2.2*  CALCIUM 7.2* 7.2* 7.5*  7.3* 7.5* 7.4* 7.1* 7.3*  PHOS 4.4 5.2* 3.9 5.7* 3.9 5.3* 4.1   Liver Function Tests: Recent Labs  Lab 01/30/23 0200 01/31/23 0108 02/01/23 0043  ALBUMIN 2.2* 2.2* 2.2*   No results for input(s): "LIPASE", "AMYLASE" in the last 168 hours. No results for input(s): "AMMONIA" in the last 168 hours. CBC: Recent Labs  Lab 01/26/23 0043 01/27/23 0053 01/27/23 1036 01/28/23 0041 01/29/23 0048  WBC 9.3 9.6 9.7 9.8 9.1  HGB 8.3* 8.5* 8.7* 8.8* 8.5*  HCT 24.8* 25.4* 26.1* 26.0* 26.6*  MCV 90.2 91.4 90.9 91.2 93.7  PLT 226 230 217 235 230   Cardiac Enzymes: No results for input(s): "CKTOTAL", "CKMB", "CKMBINDEX", "TROPONINI" in the last 168 hours. CBG: Recent Labs  Lab 01/31/23 0558 01/31/23 1055 01/31/23 1729  01/31/23 2058 02/01/23 0548  GLUCAP 122* 177* 122* 235* 159*    Iron Studies: No results for input(s): "IRON", "TIBC", "TRANSFERRIN", "FERRITIN" in the last 72 hours. Studies/Results: No results found.  apixaban  5 mg Oral BID   atorvastatin  40 mg Oral Daily   Chlorhexidine Gluconate Cloth  6 each Topical Q0600   darbepoetin (ARANESP) injection - DIALYSIS  60 mcg Subcutaneous Q Sat-1800   diltiazem  180 mg Oral Daily   feeding supplement (GLUCERNA SHAKE)  237 mL Oral TID BM   insulin aspart  0-15 Units Subcutaneous TID WC   insulin aspart  0-5 Units Subcutaneous QHS   levothyroxine  75 mcg Oral QAC breakfast   metoprolol succinate  50 mg Oral Daily   multivitamin  1 tablet Oral QHS    BMET    Component Value Date/Time   NA 135 02/01/2023 0043   K 3.7 02/01/2023 0043   CL 97 (L) 02/01/2023 0043   CO2 28 02/01/2023 0043   GLUCOSE 162 (H) 02/01/2023 0043   BUN 29 (H) 02/01/2023 0043   CREATININE 4.71 (H) 02/01/2023 0043   CALCIUM 7.3 (L) 02/01/2023 0043   CALCIUM 7.3 (L) 01/28/2023 0041   GFRNONAA 12 (L) 02/01/2023 0043   GFRAA 56 (L) 08/31/2019  1200   CBC    Component Value Date/Time   WBC 9.1 01/29/2023 0048   RBC 2.84 (L) 01/29/2023 0048   HGB 8.5 (L) 01/29/2023 0048   HCT 26.6 (L) 01/29/2023 0048   PLT 230 01/29/2023 0048   MCV 93.7 01/29/2023 0048   MCH 29.9 01/29/2023 0048   MCHC 32.0 01/29/2023 0048   RDW 13.1 01/29/2023 0048   LYMPHSABS 1.4 01/22/2023 0608   MONOABS 0.6 01/22/2023 0608   EOSABS 0.1 01/22/2023 0608   BASOSABS 0.1 01/22/2023 0608     Assessment/Plan:  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 Consult IR for [facility] on 01/24/23 and had first HD session 01/25/23.  AVF placed on 01/28/23. HD MWF.  CLIP process started - awaiting acceptance then ready for d/c. Avoid nephrotoxic medications including NSAIDs and iodinated intravenous  contrast exposure unless the latter is absolutely indicated.  Preferred narcotic agents for pain control are hydromorphone, fentanyl, and methadone. Morphine should not be used. Avoid Baclofen and avoid oral sodium phosphate and magnesium citrate based laxatives / bowel preps. Continue strict Input and Output monitoring. Will monitor the patient closely with you and intervene or adjust therapy as indicated by changes in clinical status/labs  Acute hypoxic respiratory failure - due to pulmonary edema.  ECHO grade 1 DD, normal EF.  improved.  Probe EDW with HD - appears to have reached.  Bronchodilators per primary svc. Anemia of CKD stage IV - TSAT 20%.  Started IV iron and ESA. Hb stable in 8s Acute on chronic CHF - diuresing/UF with HD, approaching euvolemia.  ECHO EF 55-60%, Grade I DD. 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 Eliquis. Back in NSR now.  Disposition - still awaiting outpatient dialysis arrangements to be made then ready for d/c.     Estill Bakes MD [facility] Pager [phone]

## 2023-02-01 NOTE — Plan of Care (Signed)
  Problem: Education: Goal: Knowledge of General Education information will improve Description: Including pain rating scale, medication(s)/side effects and non-pharmacologic comfort measures Outcome: Progressing   Problem: Health Behavior/Discharge Planning: Goal: Ability to manage health-related needs will improve Outcome: Progressing   Problem: Clinical Measurements: Goal: Ability to maintain clinical measurements within normal limits will improve Outcome: Progressing Goal: Will remain free from infection Outcome: Progressing Goal: Diagnostic test results will improve Outcome: Progressing Goal: Respiratory complications will improve Outcome: Progressing Goal: Cardiovascular complication will be avoided Outcome: Progressing   Problem: Activity: Goal: Risk for activity intolerance will decrease Outcome: Progressing   Problem: Nutrition: Goal: Adequate nutrition will be maintained Outcome: Progressing   Problem: Coping: Goal: Level of anxiety will decrease Outcome: Progressing   Problem: Elimination: Goal: Will not experience complications related to bowel motility Outcome: Progressing Goal: Will not experience complications related to urinary retention Outcome: Progressing   Problem: Pain Managment: Goal: General experience of comfort will improve Outcome: Progressing   Problem: Safety: Goal: Ability to remain free from injury will improve Outcome: Progressing   Problem: Skin Integrity: Goal: Risk for impaired skin integrity will decrease Outcome: Progressing   Problem: Education: Goal: Ability to describe self-care measures that may prevent or decrease complications (Diabetes Survival Skills Education) will improve Outcome: Progressing Goal: Individualized Educational Video(s) Outcome: Progressing   Problem: Coping: Goal: Ability to adjust to condition or change in health will improve Outcome: Progressing   Problem: Fluid Volume: Goal: Ability to  maintain a balanced intake and output will improve Outcome: Progressing   Problem: Health Behavior/Discharge Planning: Goal: Ability to identify and utilize available resources and services will improve Outcome: Progressing Goal: Ability to manage health-related needs will improve Outcome: Progressing   Problem: Metabolic: Goal: Ability to maintain appropriate glucose levels will improve Outcome: Progressing   Problem: Nutritional: Goal: Maintenance of adequate nutrition will improve Outcome: Progressing Goal: Progress toward achieving an optimal weight will improve Outcome: Progressing   Problem: Skin Integrity: Goal: Risk for impaired skin integrity will decrease Outcome: Progressing   Problem: Tissue Perfusion: Goal: Adequacy of tissue perfusion will improve Outcome: Progressing   Problem: Education: Goal: Knowledge of disease and its progression will improve Outcome: Progressing Goal: Individualized Educational Video(s) Outcome: Progressing   Problem: Fluid Volume: Goal: Compliance with measures to maintain balanced fluid volume will improve Outcome: Progressing   Problem: Health Behavior/Discharge Planning: Goal: Ability to manage health-related needs will improve Outcome: Progressing   Problem: Nutritional: Goal: Ability to make healthy dietary choices will improve Outcome: Progressing   Problem: Clinical Measurements: Goal: Complications related to the disease process, condition or treatment will be avoided or minimized Outcome: Progressing   

## 2023-02-02 LAB — RENAL FUNCTION PANEL
Albumin: 2.3 g/dL — ABNORMAL LOW (ref 3.5–5.0)
Anion gap: 10 (ref 5–15)
BUN: 40 mg/dL — ABNORMAL HIGH (ref 8–23)
CO2: 26 mmol/L (ref 22–32)
Calcium: 7.3 mg/dL — ABNORMAL LOW (ref 8.9–10.3)
Chloride: 98 mmol/L (ref 98–111)
Creatinine, Ser: 6.11 mg/dL — ABNORMAL HIGH (ref 0.61–1.24)
GFR, Estimated: 9 mL/min — ABNORMAL LOW (ref >60)
Glucose, Bld: 155 mg/dL — ABNORMAL HIGH (ref 70–99)
Phosphorus: 5.1 mg/dL — ABNORMAL HIGH (ref 2.5–4.6)
Potassium: 4 mmol/L (ref 3.5–5.1)
Sodium: 134 mmol/L — ABNORMAL LOW (ref 135–145)

## 2023-02-02 LAB — GLUCOSE, CAPILLARY
Glucose-Capillary: 142 mg/dL — ABNORMAL HIGH (ref 70–99)
Glucose-Capillary: 192 mg/dL — ABNORMAL HIGH (ref 70–99)
Glucose-Capillary: 160 mg/dL — ABNORMAL HIGH (ref 70–99)
Glucose-Capillary: 148 mg/dL — ABNORMAL HIGH (ref 70–99)

## 2023-02-02 NOTE — Progress Notes (Signed)
PROGRESS NOTE    [name]  UKG:[id number] DOB: 1950-07-23 DOA: 01/22/2023 PCP: System, Provider Not In     Brief Narrative:  [name] is a 72 year old male with history of past medical history of HTN, DM2, paroxysmal A-fib on Eliquis, CKD stage IV, recent CVA admitted to [facility] on Friday and diagnosed with CVA with residual left upper and lower extremity weakness. Eventually patient went home and then suddenly developed shortness of breath with change in mental status at home. Patient was found to have elevated BNP, creatinine of 5.96.  Hospitalist called for admission, nephrology team called in consult.   7/5 -temporary dialysis catheter placed without complication 7/6 -tolerated dialysis with only minimal symptoms towards the end of treatment, transient tachycardia resolved with as needed medication 7/8 -repeat HD treatment 7/9 -fistula creation per vascular surgery  7/10 -HD. Awaiting CLIP  7/11 -A-fib RVR, Cardizem drip started, converted to normal sinus in the afternoon  New events last 24 hours / Subjective: Patient seen with iPad interpreter.  He has no complaints today.  Remains in normal sinus rhythm  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 EF 55% Hypertensive emergency -Respiratory failure has improved -Volume management with dialysis   CKD IV now advanced to ESRD on dialysis -Status post LUE fistula creation -Follow-up with vascular surgery in 4 to 6 weeks -Nephrology following -Awaiting CLIP    Hypertensive emergency -Improved, Toprol, Cardizem   Paroxysmal A-fib, with RVR -Eliquis -Cardizem, Toprol   Non-insulin-dependent diabetes type 2, well-controlled -A1c 5.6  -Continue sliding scale insulin   Hypothyroidism -Synthroid   Recent CVA/ left sided weakness -Eliquis, Lipitor  DVT prophylaxis:   apixaban (ELIQUIS) tablet 5 mg  Code Status: Full code Family Communication: Grandson at bedside Disposition Plan: Home, awaiting clip Status is: Inpatient Remains inpatient appropriate because: Awaiting clip, medically stable for discharge otherwise   Antimicrobials:  Anti-infectives (From admission, onward)    Start     Dose/Rate Route Frequency Ordered Stop   01/24/23 1115  ceFAZolin (ANCEF) IVPB 2g/100 mL premix        2 g 200 mL/hr over 30 Minutes Intravenous  Once 01/24/23 1020 01/24/23 1445        Objective: Vitals:   02/02/23 0050 02/02/23 0401 02/02/23 0717 02/02/23 1121  BP: 117/80 122/68 132/66 (!) 143/83  Pulse: 68 73 77 72  Resp: (!) 21 18 18 20   Temp: 98.5 F (36.9 C) 98.2 F (36.8 C) 97.7 F (36.5 C) 97.8 F (36.6 C)  TempSrc: Oral Oral Oral Oral  SpO2: 96% 96% 99% 97%  Weight:  89.7 kg    Height:        Intake/Output Summary (Last 24 hours) at 02/02/2023 1123 Last data filed at 02/02/2023 0856 Gross per 24 hour  Intake 340 ml  Output 425 ml  Net -85 ml   Filed Weights   01/31/23 1305 02/01/23 0538 02/02/23 0401  Weight: 91.6 kg 88.4 kg 89.7 kg    Examination:  General exam: Appears calm and comfortable  Respiratory system: Clear to auscultation.  Respiratory effort normal. No respiratory distress. No conversational dyspnea.  Cardiovascular system: S1 & S2 heard, normal sinus rhythm Gastrointestinal system: Abdomen is nondistended, soft and nontender. Normal bowel sounds heard. Central nervous system: Alert and oriented. No focal neurological deficits. Speech clear.  Extremities: Symmetric in appearance, LUE fistula with palpable  thrill Skin: No rashes, lesions or ulcers on exposed skin  Psychiatry: Judgement and insight appear normal. Mood & affect appropriate.   Data Reviewed: I have personally reviewed following labs and imaging studies  CBC: Recent Labs  Lab 01/27/23 0053 01/27/23 1036 01/28/23 0041 01/29/23 0048  WBC 9.6 9.7 9.8 9.1  HGB 8.5* 8.7* 8.8* 8.5*  HCT 25.4* 26.1* 26.0* 26.6*  MCV 91.4 90.9 91.2 93.7  PLT 230 217 235 230   Basic Metabolic Panel: Recent Labs  Lab 01/29/23 0048 01/30/23 0200 01/31/23 0108 02/01/23 0043 02/02/23 0100  NA 135 134* 135 135 134*  K 4.0 4.0 3.8 3.7 4.0  CL 102 99 99 97* 98  CO2 22 27 24 28 26   GLUCOSE 216* 198* 169* 162* 155*  BUN 57* 37* 49* 29* 40*  CREATININE 5.96* 4.92* 6.47* 4.71* 6.11*  CALCIUM 7.5* 7.4* 7.1* 7.3* 7.3*  PHOS 5.7* 3.9 5.3* 4.1 5.1*   GFR: Estimated Creatinine Clearance: 11.3 mL/min (A) (by C-G formula based on SCr of 6.11 mg/dL (H)). Liver Function Tests: Recent Labs  Lab 01/29/23 0048 01/30/23 0200 01/31/23 0108 02/01/23 0043 02/02/23 0100  ALBUMIN 2.2* 2.2* 2.2* 2.2* 2.3*   No results for input(s): "LIPASE", "AMYLASE" in the last 168 hours. No results for input(s): "AMMONIA" in the last 168 hours. Coagulation Profile: No results for input(s): "INR", "PROTIME" in the last 168 hours. Cardiac Enzymes: No results for input(s): "CKTOTAL", "CKMB", "CKMBINDEX", "TROPONINI" in the last 168 hours. BNP (last 3 results) No results for input(s): "PROBNP" in the last 8760 hours. HbA1C: No results for input(s): "HGBA1C" in the last 72 hours. CBG: Recent Labs  Lab 02/01/23 1123 02/01/23 1546 02/01/23 2049 02/02/23 0553 02/02/23 1120  GLUCAP 214* 171* 204* 142* 192*   Lipid Profile: No results for input(s): "CHOL", "HDL", "LDLCALC", "TRIG", "CHOLHDL", "LDLDIRECT" in the last 72 hours. Thyroid Function Tests: No results for input(s): "TSH", "T4TOTAL", "FREET4", "T3FREE", "THYROIDAB" in the last 72 hours. Anemia Panel: No  results for input(s): "VITAMINB12", "FOLATE", "FERRITIN", "TIBC", "IRON", "RETICCTPCT" in the last 72 hours. Sepsis Labs: No results for input(s): "PROCALCITON", "LATICACIDVEN" in the last 168 hours.  Recent Results (from the past 240 hour(s))  MRSA Next Gen by PCR, Nasal     Status: None   Collection Time: 01/26/23  9:59 AM   Specimen: Nasal Mucosa; Nasal Swab  Result Value Ref Range Status   MRSA by PCR Next Gen NOT DETECTED NOT DETECTED Final    Comment: (NOTE) The GeneXpert MRSA Assay (FDA approved for NASAL specimens only), is one component of a comprehensive MRSA colonization surveillance program. It is not intended to diagnose MRSA infection nor to guide or monitor treatment for MRSA infections. Test performance is not FDA approved in patients less than 28 years old. Performed at [facility], [address] [place], [place] [address]       Radiology Studies: No results found.    Scheduled Meds:  apixaban  5 mg Oral BID   atorvastatin  40 mg Oral Daily   Chlorhexidine Gluconate Cloth  6 each Topical Q0600   darbepoetin (ARANESP) injection - DIALYSIS  60 mcg Subcutaneous Q Sat-1800   diltiazem  180 mg Oral Daily   feeding supplement (GLUCERNA SHAKE)  237 mL Oral TID BM   insulin aspart  0-15 Units Subcutaneous TID WC   insulin aspart  0-5 Units Subcutaneous QHS   levothyroxine  75 mcg Oral QAC breakfast   metoprolol succinate  50 mg Oral Daily   multivitamin  1 tablet Oral QHS   Continuous Infusions:     LOS: 11 days   Time spent: 20 minutes   Noralee Stain, DO [facility] 02/02/2023, 11:23 AM   Available via Epic secure chat 7am-7pm After these hours, please refer to coverage provider listed on [email or web address]

## 2023-02-02 NOTE — Plan of Care (Signed)
  Problem: Education: Goal: Knowledge of General Education information will improve Description: Including pain rating scale, medication(s)/side effects and non-pharmacologic comfort measures Outcome: Progressing   Problem: Health Behavior/Discharge Planning: Goal: Ability to manage health-related needs will improve Outcome: Progressing   Problem: Clinical Measurements: Goal: Ability to maintain clinical measurements within normal limits will improve Outcome: Progressing Goal: Will remain free from infection Outcome: Progressing Goal: Diagnostic test results will improve Outcome: Progressing Goal: Respiratory complications will improve Outcome: Progressing Goal: Cardiovascular complication will be avoided Outcome: Progressing   Problem: Activity: Goal: Risk for activity intolerance will decrease Outcome: Progressing   Problem: Nutrition: Goal: Adequate nutrition will be maintained Outcome: Progressing   Problem: Coping: Goal: Level of anxiety will decrease Outcome: Progressing   Problem: Elimination: Goal: Will not experience complications related to bowel motility Outcome: Progressing Goal: Will not experience complications related to urinary retention Outcome: Progressing   Problem: Pain Managment: Goal: General experience of comfort will improve Outcome: Progressing   Problem: Safety: Goal: Ability to remain free from injury will improve Outcome: Progressing   Problem: Skin Integrity: Goal: Risk for impaired skin integrity will decrease Outcome: Progressing   Problem: Education: Goal: Ability to describe self-care measures that may prevent or decrease complications (Diabetes Survival Skills Education) will improve Outcome: Progressing Goal: Individualized Educational Video(s) Outcome: Progressing   Problem: Coping: Goal: Ability to adjust to condition or change in health will improve Outcome: Progressing   Problem: Fluid Volume: Goal: Ability to  maintain a balanced intake and output will improve Outcome: Progressing   Problem: Health Behavior/Discharge Planning: Goal: Ability to identify and utilize available resources and services will improve Outcome: Progressing Goal: Ability to manage health-related needs will improve Outcome: Progressing   Problem: Metabolic: Goal: Ability to maintain appropriate glucose levels will improve Outcome: Progressing   Problem: Nutritional: Goal: Maintenance of adequate nutrition will improve Outcome: Progressing Goal: Progress toward achieving an optimal weight will improve Outcome: Progressing   Problem: Skin Integrity: Goal: Risk for impaired skin integrity will decrease Outcome: Progressing   Problem: Tissue Perfusion: Goal: Adequacy of tissue perfusion will improve Outcome: Progressing   Problem: Education: Goal: Knowledge of disease and its progression will improve Outcome: Progressing Goal: Individualized Educational Video(s) Outcome: Progressing   Problem: Fluid Volume: Goal: Compliance with measures to maintain balanced fluid volume will improve Outcome: Progressing   Problem: Health Behavior/Discharge Planning: Goal: Ability to manage health-related needs will improve Outcome: Progressing   Problem: Nutritional: Goal: Ability to make healthy dietary choices will improve Outcome: Progressing   Problem: Clinical Measurements: Goal: Complications related to the disease process, condition or treatment will be avoided or minimized Outcome: Progressing   

## 2023-02-02 NOTE — Progress Notes (Signed)
Patient ID: [name], male   DOB: 07-15-1951, 72 y.o.   MRN: [id number] S: No new issues  O:BP 132/66 (BP Location: Right Arm)   Pulse 77   Temp 97.7 F (36.5 C) (Oral)   Resp 18   Ht 5\' 11"  (1.803 m)   Wt 89.7 kg   SpO2 99%   BMI 27.58 kg/m   Intake/Output Summary (Last 24 hours) at 02/02/2023 1044 Last data filed at 02/02/2023 0856 Gross per 24 hour  Intake 340 ml  Output 425 ml  Net -85 ml   Intake/Output: I/O last 3 completed shifts: In: 460 [P.O.:460] Out: 625 [Urine:625]  Intake/Output this shift:  Total I/O In: 120 [P.O.:120] Out: 125 [Urine:125] Weight change: -1.9 kg Gen:NAD CVS: RRR Resp: CTAB Ext: no edema RIJ tunneled dialysis cath c/d/I, LUE AVF +t/b.  Recent Labs  Lab 01/27/23 0053 01/28/23 0041 01/29/23 0048 01/30/23 0200 01/31/23 0108 02/01/23 0043 02/02/23 0100  NA 136 138 135 134* 135 135 134*  K 4.4 3.6 4.0 4.0 3.8 3.7 4.0  CL 104 100 102 99 99 97* 98  CO2 20* 22 22 27 24 28 26   GLUCOSE 115* 117* 216* 198* 169* 162* 155*  BUN 69* 50* 57* 37* 49* 29* 40*  CREATININE 6.06* 4.93* 5.96* 4.92* 6.47* 4.71* 6.11*  ALBUMIN 2.3* 2.1* 2.2* 2.2* 2.2* 2.2* 2.3*  CALCIUM 7.2* 7.5*  7.3* 7.5* 7.4* 7.1* 7.3* 7.3*  PHOS 5.2* 3.9 5.7* 3.9 5.3* 4.1 5.1*   Liver Function Tests: Recent Labs  Lab 01/31/23 0108 02/01/23 0043 02/02/23 0100  ALBUMIN 2.2* 2.2* 2.3*   No results for input(s): "LIPASE", "AMYLASE" in the last 168 hours. No results for input(s): "AMMONIA" in the last 168 hours. CBC: Recent Labs  Lab 01/27/23 0053 01/27/23 1036 01/28/23 0041 01/29/23 0048  WBC 9.6 9.7 9.8 9.1  HGB 8.5* 8.7* 8.8* 8.5*  HCT 25.4* 26.1* 26.0* 26.6*  MCV 91.4 90.9 91.2 93.7  PLT 230 217 235 230   Cardiac Enzymes: No results for input(s): "CKTOTAL", "CKMB", "CKMBINDEX", "TROPONINI" in the last 168 hours. CBG: Recent Labs  Lab 02/01/23 0548 02/01/23 1123 02/01/23 1546 02/01/23 2049 02/02/23 0553  GLUCAP 159* 214* 171* 204* 142*     Iron Studies: No results for input(s): "IRON", "TIBC", "TRANSFERRIN", "FERRITIN" in the last 72 hours. Studies/Results: No results found.  apixaban  5 mg Oral BID   atorvastatin  40 mg Oral Daily   Chlorhexidine Gluconate Cloth  6 each Topical Q0600   darbepoetin (ARANESP) injection - DIALYSIS  60 mcg Subcutaneous Q Sat-1800   diltiazem  180 mg Oral Daily   feeding supplement (GLUCERNA SHAKE)  237 mL Oral TID BM   insulin aspart  0-15 Units Subcutaneous TID WC   insulin aspart  0-5 Units Subcutaneous QHS   levothyroxine  75 mcg Oral QAC breakfast   metoprolol succinate  50 mg Oral Daily   multivitamin  1 tablet Oral QHS    BMET    Component Value Date/Time   NA 134 (L) 02/02/2023 0100   K 4.0 02/02/2023 0100   CL 98 02/02/2023 0100   CO2 26 02/02/2023 0100   GLUCOSE 155 (H) 02/02/2023 0100   BUN 40 (H) 02/02/2023 0100   CREATININE 6.11 (H) 02/02/2023 0100   CALCIUM 7.3 (L) 02/02/2023 0100   CALCIUM 7.3 (L) 01/28/2023 0041   GFRNONAA 9 (L) 02/02/2023 0100   GFRAA 56 (L) 08/31/2019 1200   CBC    Component Value Date/Time  WBC 9.1 01/29/2023 0048   RBC 2.84 (L) 01/29/2023 0048   HGB 8.5 (L) 01/29/2023 0048   HCT 26.6 (L) 01/29/2023 0048   PLT 230 01/29/2023 0048   MCV 93.7 01/29/2023 0048   MCH 29.9 01/29/2023 0048   MCHC 32.0 01/29/2023 0048   RDW 13.1 01/29/2023 0048   LYMPHSABS 1.4 01/22/2023 0608   MONOABS 0.6 01/22/2023 0608   EOSABS 0.1 01/22/2023 0608   BASOSABS 0.1 01/22/2023 0608     Assessment/Plan:  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 Consult IR for [facility] on 01/24/23 and had first HD session 01/25/23.  AVF placed on 01/28/23. HD MWF.  CLIP process started - awaiting acceptance then ready for d/c. Avoid nephrotoxic medications including NSAIDs and iodinated intravenous contrast exposure unless the latter is absolutely indicated.   Preferred narcotic agents for pain control are hydromorphone, fentanyl, and methadone. Morphine should not be used. Avoid Baclofen and avoid oral sodium phosphate and magnesium citrate based laxatives / bowel preps. Continue strict Input and Output monitoring. Will monitor the patient closely with you and intervene or adjust therapy as indicated by changes in clinical status/labs  Acute hypoxic respiratory failure - due to pulmonary edema.  ECHO grade 1 DD, normal EF.  improved.  Probe EDW with HD - appears to have reached.  Bronchodilators per primary svc. Anemia of CKD stage IV - TSAT 20%.  Started IV iron and ESA. Hb stable in 8s Acute on chronic CHF - diuresing/UF with HD, approaching euvolemia.  ECHO EF 55-60%, Grade I DD. 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 Eliquis. Back in NSR now.  Disposition - still awaiting outpatient dialysis arrangements to be made then ready for d/c.     Estill Bakes MD [facility] Pager [phone]

## 2023-02-03 ENCOUNTER — Other Ambulatory Visit (HOSPITAL_COMMUNITY): Payer: Self-pay

## 2023-02-03 LAB — GLUCOSE, CAPILLARY
Glucose-Capillary: 162 mg/dL — ABNORMAL HIGH (ref 70–99)
Glucose-Capillary: 168 mg/dL — ABNORMAL HIGH (ref 70–99)

## 2023-02-03 LAB — RENAL FUNCTION PANEL
Albumin: 2.5 g/dL — ABNORMAL LOW (ref 3.5–5.0)
Anion gap: 17 — ABNORMAL HIGH (ref 5–15)
BUN: 57 mg/dL — ABNORMAL HIGH (ref 8–23)
CO2: 22 mmol/L (ref 22–32)
Calcium: 7.3 mg/dL — ABNORMAL LOW (ref 8.9–10.3)
Chloride: 97 mmol/L — ABNORMAL LOW (ref 98–111)
Creatinine, Ser: 7.24 mg/dL — ABNORMAL HIGH (ref 0.61–1.24)
GFR, Estimated: 7 mL/min — ABNORMAL LOW (ref >60)
Glucose, Bld: 162 mg/dL — ABNORMAL HIGH (ref 70–99)
Phosphorus: 6 mg/dL — ABNORMAL HIGH (ref 2.5–4.6)
Potassium: 4.3 mmol/L (ref 3.5–5.1)
Sodium: 136 mmol/L (ref 135–145)

## 2023-02-03 MED ORDER — POLYETHYLENE GLYCOL 3350 17 G PO PACK
17.0000 g | PACK | Freq: Every day | ORAL | Status: DC
  Administered 2023-02-03: 17 g via ORAL
  Filled 2023-02-03 (×2): qty 1

## 2023-02-03 MED ORDER — HEPARIN SODIUM (PORCINE) 1000 UNIT/ML IJ SOLN
INTRAMUSCULAR | Status: AC
  Filled 2023-02-03: qty 4

## 2023-02-03 MED ORDER — APIXABAN 5 MG PO TABS
5.0000 mg | ORAL_TABLET | Freq: Two times a day (BID) | ORAL | 1 refills | Status: AC
  Filled 2023-02-03: qty 60, 30d supply, fill #0

## 2023-02-03 MED ORDER — METOPROLOL SUCCINATE ER 50 MG PO TB24
50.0000 mg | ORAL_TABLET | Freq: Every day | ORAL | 1 refills | Status: DC
  Filled 2023-02-03: qty 30, 30d supply, fill #0

## 2023-02-03 MED ORDER — GLIPIZIDE 5 MG PO TABS
5.0000 mg | ORAL_TABLET | Freq: Every day | ORAL | 1 refills | Status: AC
  Filled 2023-02-03: qty 30, 30d supply, fill #0

## 2023-02-03 MED ORDER — DILTIAZEM HCL ER COATED BEADS 180 MG PO CP24
180.0000 mg | ORAL_CAPSULE | Freq: Every day | ORAL | 1 refills | Status: DC
  Filled 2023-02-03: qty 30, 30d supply, fill #0

## 2023-02-03 NOTE — Progress Notes (Signed)
Physical Therapy Treatment Patient Details Name: [name] MRN: [id number] DOB: Aug 12, 1950 Today's Date: 02/03/2023   History of Present Illness 72 y.o. male presents to [facility] hospital on 01/22/2023 with SOB and AMS, found to be in acute respiratory failure 2/2 pulmonary edema along with acute diastolic congestive heart failure. 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 ESRD on HD M,W,F.  PMH includes HTN, DMII, PAF, CKD IV.    PT Comments  Pt admitted with above diagnosis. Pt was able to ambulate with RW with min guard assist. Wife and daughter present and educated in how to guard pt with gait belt with ambulation and they feel comfortable with cuing pt and assist for mobilty. Pt and family also educated in Medbridge exercises program and understand. Daughter translated today per her request.  Updated equipment as pt will benefit from wheelchair on HD days as he is weak after HD.  Due to pt height, would also benefit from 3N1 to get off low toilet as well as a shower chair for walk in shower. Issued gait belt to pt.  Pt and family decline f/u at this time.  Will continue to follow acutely until pt d/c's.   Pt currently with functional limitations due to the deficits listed below (see PT Problem List). Pt will benefit from acute skilled PT to increase their independence and safety with mobility to allow discharge.        Assistance Recommended at Discharge Set up Supervision/Assistance  If plan is discharge home, recommend the following:  Can travel by private vehicle    Help with stairs or ramp for entrance;Assist for transportation;A little help with walking and/or transfers;A little help with bathing/dressing/bathroom;Assistance with Engineer, technical sales (18x16 lightweight with tall seat to floor height, desk armrests, and anti tippers);Wheelchair cushion (pressure relieving cushion  18x16);BSC/3in1 (shower chair)    Recommendations for Other Services OT consult     Precautions / Restrictions Precautions Precautions: Fall Restrictions Weight Bearing Restrictions: No     Mobility  Bed Mobility Overal bed mobility: Modified Independent             General bed mobility comments: supervision for safety, pt not needing assistance    Transfers Overall transfer level: Needs assistance Equipment used: Rolling walker (2 wheels) Transfers: Sit to/from Stand, Bed to chair/wheelchair/BSC Sit to Stand: Supervision   Step pivot transfers: Supervision       General transfer comment: Pt needed cues for hand placement with return to sitting and for standing up.  Family educated as well.    Ambulation/Gait Ambulation/Gait assistance: Min guard Gait Distance (Feet): 30 Feet Assistive device: Rolling walker (2 wheels) Gait Pattern/deviations: Step-to pattern, Step-through pattern, Decreased step length - right, Decreased stride length, Decreased weight shift to left, Decreased stance time - right, Trunk flexed, Narrow base of support, Drifts right/left Gait velocity: decreased Gait velocity interpretation: <1.31 ft/sec, indicative of household ambulator   General Gait Details: Gait steady with use of RW.  A few cues with turns to keep up with RW.  Family educated and they feel comfortable assisting pt.  Educated in use of gait belt.   Stairs             Wheelchair Mobility     Tilt Bed    Modified Rankin (Stroke Patients Only)       Balance Overall balance assessment: Mild deficits observed, not formally tested Sitting-balance support: Feet  supported, Single extremity supported Sitting balance-Leahy Scale: Good Sitting balance - Comments: Able to sit EOB with single UE support.   Standing balance support: Bilateral upper extremity supported, During functional activity, Reliant on assistive device for balance Standing balance-Leahy Scale:  Fair Standing balance comment: can static stand without UE support, BUE support for gait                            Cognition Arousal/Alertness: Awake/alert Behavior During Therapy: WFL for tasks assessed/performed Overall Cognitive Status: Within Functional Limits for tasks assessed                                 General Comments: Patient A&Ox4; able to follow one step commands consistently throughout session.        Exercises General Exercises - Lower Extremity Long Arc Quad: AROM, Both, 10 reps, Seated Heel Slides: AROM, Both, 10 reps, Supine Hip ABduction/ADduction: AROM, Both, 10 reps, Supine Hip Flexion/Marching: AROM, Both, 10 reps, Seated, Standing Toe Raises: AROM, Both, 10 reps, Standing Mini-Sqauts: AROM, Both, 10 reps, Standing Other Exercises Other Exercises: Medbridge handout [id number] given to pt. Educated pt, daughter and wife regarding the program. Program was printed in [place].    General Comments General comments (skin integrity, edema, etc.): 87-119 bpm, initial BP 137/84, after walk 117/72 with mild dizziness per pt report      Pertinent Vitals/Pain Pain Assessment Pain Assessment: No/denies pain    Home Living                          Prior Function            PT Goals (current goals can now be found in the care plan section) Acute Rehab PT Goals Patient Stated Goal: Patient would like to return home to family. Progress towards PT goals: Progressing toward goals    Frequency    Min 1X/week      PT Plan Current plan remains appropriate    Co-evaluation              AM-PAC PT "6 Clicks" Mobility   Outcome Measure  Help needed turning from your back to your side while in a flat bed without using bedrails?: None Help needed moving from lying on your back to sitting on the side of a flat bed without using bedrails?: None Help needed moving to and from a bed to a chair (including a wheelchair)?:  None Help needed standing up from a chair using your arms (e.g., wheelchair or bedside chair)?: A Little Help needed to walk in hospital room?: A Little Help needed climbing 3-5 steps with a railing? : A Little 6 Click Score: 21    End of Session Equipment Utilized During Treatment: Gait belt Activity Tolerance: Patient tolerated treatment well Patient left: with call bell/phone within reach;in bed;with bed alarm set;with family/visitor present Nurse Communication: Mobility status PT Visit Diagnosis: Muscle weakness (generalized) (M62.81);Difficulty in walking, not elsewhere classified (R26.2)     Time: 7829-5621 PT Time Calculation (min) (ACUTE ONLY): 24 min  Charges:    $Gait Training: 8-22 mins $Therapeutic Exercise: 8-22 mins PT General Charges $$ ACUTE PT VISIT: 1 Visit                     Serinity Ware M,PT Acute Rehab Services [phone]    Amadeo Garnet  Lakeeta Dobosz 02/03/2023, 1:29 PM

## 2023-02-03 NOTE — Progress Notes (Signed)
   02/03/23 1200  Vitals  Temp (!) 97.5 F (36.4 C)  Pulse Rate 72  Resp 16  BP 133/73  SpO2 97 %  O2 Device Room Air  Weight 87.2 kg  Type of Weight Post-Dialysis  Oxygen Therapy  Patient Activity (if Appropriate) In bed  Post Treatment  Dialyzer Clearance Clear  Duration of HD Treatment -hour(s) 3.5 hour(s)  Hemodialysis Intake (mL) 0 mL  Liters Processed 73.5  Fluid Removed (mL) 2000 mL  Tolerated HD Treatment Yes   Received patient in bed to unit.  Alert and oriented.  Informed consent signed and in chart.   TX duration:3.5hr  Patient tolerated well.  Transported back to the room  Alert, without acute distress.  Hand-off given to patient's nurse.   Access used: [facility] Access issues: none  Total UF removed: 2L Medication(s) given: none    [facility]

## 2023-02-03 NOTE — TOC Transition Note (Signed)
Transition of Care [facility]) - CM/SW Discharge Note   Patient Details  Name: [name] MRN: [id number] Date of Birth: 02/20/1951  Transition of Care [facility]) CM/SW Contact:  [name], RN Phone Number: 02/03/2023, 11:25 AM   Clinical Narrative:    Patient is for dc today, NCM assisted with Match Letter for medications, NCM spoke with wife and daughter at the bedside, daughter speaks [place], she states wife does not have the money to pay for oupt therapy the would like for physical therapist to give them some papers with exercises on them that patient can do at home on his own .  NCM spoke with Dawn the physical therapist she will give this to the patient when she sees him today when he gets back from HD.  Family will be transporting him to HD .       Barriers to Discharge: Continued Medical Work up   Patient Goals and CMS Choice   Choice offered to / list presented to : NA  Discharge Placement                         Discharge Plan and Services Additional resources added to the After Visit Summary for   In-house Referral: NA Discharge Planning Services: CM Consult Post Acute Care Choice: NA          DME Arranged: N/A DME Agency: NA       HH Arranged: NA          Social Determinants of Health (SDOH) Interventions SDOH Screenings   Food Insecurity: No Food Insecurity (01/22/2023)  Housing: Low Risk  (01/22/2023)  Transportation Needs: No Transportation Needs (01/22/2023)  Utilities: Not At Risk (01/22/2023)  Financial Resource Strain: Low Risk  (10/18/2020)   Received from [facility], [facility]  Tobacco Use: Low Risk  (01/28/2023)     Readmission Risk Interventions     No data to display

## 2023-02-03 NOTE — Progress Notes (Addendum)
Contacted [facility]missions this morning to request an update on pt's financial clearance. Spoke to [place] who is to f/u on pt's case and to return call to navigator with update. Raynelle Fanning advised pt is ready for d/c and that clearance is needed as soon as possible. Will assist as needed.    Olivia Canter Renal Navigator [phone]  Addendum at 10:37 am: Pt has been financially cleared and accepted at [facility] on TTS 7:00 am chair time. Pt can start tomorrow and will need to arrive at 6:40 am to complete paperwork prior to treatment. Spoke to pt's daughter, [place], via phone to make her aware of pt's acceptance and schedule. Daughter agreeable to plan. Arrangements added to AVS as well. Contacted attending,nephrologist, and pt's RN to provide an update. Pt to likely d/c today and will need to start tomorrow at clinic (per nephrologist) even though pt had treatment today. Pt's daughter and clinic aware of this info.   Addendum at 11:36 am: Pt to d/c to home today. Contacted [facility] to advise clinic of pt's d/c today and that pt will start tomorrow. D/C summary and today/yesterday's renal notes faxed to clinic for continuation of care.

## 2023-02-03 NOTE — Care Management Important Message (Signed)
Important Message  Patient Details  Name: [name] MRN: [id number] Date of Birth: 1950-09-07   Medicare Important Message Given:  Yes     Sherilyn Banker 02/03/2023, 4:33 PM

## 2023-02-03 NOTE — Progress Notes (Signed)
PT Cancellation Note  Patient Details Name: [name] MRN: [id number] DOB: 06-Jan-1951   Cancelled Treatment:    Reason Eval/Treat Not Completed: Patient at procedure or test/unavailable (Pt in HD. Will return as able.  Thanks.)   [name] 02/03/2023, 8:51 AM Sohil Timko M,PT Acute Rehab Services [phone]

## 2023-02-03 NOTE — Discharge Summary (Signed)
Physician Discharge Summary  Kendyn Zaman UJW:[id number] DOB: 1950/12/03 DOA: 01/22/2023  PCP: Hattie Perch, FNP  Admit date: 01/22/2023 Discharge date: 02/03/2023  Admitted From: Home Disposition: Home  Recommendations for Outpatient Follow-up:  Follow up with PCP  Follow up with nephrology Follow-up with vascular surgery in 4 to 6 weeks Follow-up with cardiology, [facility], Dr. Lars Masson   Discharge Condition: Stable CODE STATUS: Full code Diet recommendation: Renal diet  Brief/Interim Summary: [name] is a 72 year old male with history of past medical history of HTN, DM2, paroxysmal A-fib on Eliquis, CKD stage IV, recent CVA admitted to [facility] on Friday and diagnosed with CVA with residual left upper and lower extremity weakness. Eventually patient went home and then suddenly developed shortness of breath with change in mental status at home. Patient was found to have elevated BNP, creatinine of 5.96.  Hospitalist called for admission, nephrology team called in consult.   7/5 -temporary dialysis catheter placed without complication 7/6 -tolerated dialysis with only minimal symptoms towards the end of treatment, transient tachycardia resolved with as needed medication 7/8 -repeat HD treatment 7/9 -fistula creation per vascular surgery  7/10 -HD. Awaiting CLIP  7/11 -A-fib RVR, Cardizem drip started, converted to normal sinus in the afternoon 7/15 -has remained in sinus over the weekend.  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 EF 55% Hypertensive emergency -Respiratory failure has improved -Volume management with dialysis   CKD IV now advanced to ESRD on dialysis -Status post LUE fistula creation -Follow-up with vascular surgery in 4 to 6 weeks -Nephrology following   Hypertensive emergency -Improved, Toprol, Cardizem   Paroxysmal A-fib, with RVR -Eliquis -Cardizem, Toprol   Non-insulin-dependent diabetes type 2, well-controlled -A1c 5.6  -Decrease home glipizide dose   Hypothyroidism -Synthroid   Recent CVA/ left sided weakness -Eliquis, Lipitor    Discharge Instructions  Discharge Instructions     Call MD for:  difficulty breathing, headache or visual disturbances   Complete by: As directed    Call MD for:  extreme fatigue   Complete by: As directed    Call MD for:  persistant dizziness or light-headedness   Complete by: As directed    Call MD for:  persistant nausea and vomiting   Complete by: As directed    Call MD for:  severe uncontrolled pain   Complete by: As directed    Call MD for:  temperature >100.4   Complete by: As directed    Discharge instructions   Complete by: As directed    You were cared for by a hospitalist during your hospital stay. If you have any questions about your discharge medications or the care you received while you were in the hospital after you are discharged, you can call the unit and ask to speak with the hospitalist on call if the hospitalist that took care of you is not available. Once you are discharged, your primary care physician will handle any further medical issues. Please note that NO REFILLS for any discharge medications will  be authorized once you are discharged, as it is imperative that you return to your primary care physician (or establish a relationship with a primary care physician if you do not have one) for your aftercare needs so that they can reassess your need for  medications and monitor your lab values.   Increase activity slowly   Complete by: As directed    No wound care   Complete by: As directed       Allergies as of 02/03/2023   No Known Allergies      Medication List     STOP taking these medications    hydrALAZINE 100 MG tablet Commonly known as: APRESOLINE       TAKE these medications    apixaban 5 MG Tabs tablet Commonly known as: ELIQUIS Take 1 tablet (5 mg total) by mouth 2 (two) times daily. What changed:  medication strength how much to take   atorvastatin 40 MG tablet Commonly known as: LIPITOR Take 40 mg by mouth daily.   diltiazem 180 MG 24 hr capsule Commonly known as: CARDIZEM CD Take 1 capsule (180 mg total) by mouth daily.   glipiZIDE 5 MG tablet Commonly known as: GLUCOTROL Take 1 tablet (5 mg total) by mouth daily before breakfast. What changed:  medication strength how much to take   levothyroxine 75 MCG tablet Commonly known as: SYNTHROID Take 75 mcg by mouth daily before breakfast.   metoprolol succinate 50 MG 24 hr tablet Commonly known as: TOPROL-XL Take 1 tablet (50 mg total) by mouth daily. Take with or immediately following a meal. What changed:  medication strength how much to take additional instructions        Follow-up Information     Hattie Perch, FNP Follow up.   Specialty: Family Medicine Why: ZO:XWRU 9 at 4:40pm fax over notesL [phone] Contact information: [address] [place] [address] [phone]         [facility] Follow up.   Why: 4-6 weeks.The office will call the patient with an appointment Contact information: [address] [place] [address] [phone]        [place]. Go on 02/04/2023.   Why: Schedule is Tuesday,Thursday,Saturday with 7:00 am chair time.  On Tuesday (7/16), please arrive at 6:40 am to complete paperwork prior to treatment. Contact  information: [address] [place] [address] [phone]         Lars Masson, MD. Schedule an appointment as soon as possible for a visit in 1 week(s).   Specialty: Cardiology Contact information: [address] [place] [address] [phone]         [place], [facility] Follow up.   Contact information: [address] [place] [address] [phone]                No Known Allergies  Procedures/Studies: IR Fluoro Guide CV Line Right  Result Date: 01/24/2023 INDICATION: dialysis catheter placement EXAM: TUNNELED CENTRAL VENOUS HEMODIALYSIS CATHETER PLACEMENT WITH ULTRASOUND AND FLUOROSCOPIC GUIDANCE MEDICATIONS: Ancef 1 gm IV . The antibiotic was given in an appropriate time interval prior to skin puncture. ANESTHESIA/SEDATION: Local anesthetic and single agent sedation was employed during this procedure. A total of Versed 1 mg was administered intravenously. The patient's level of consciousness and vital signs were monitored continuously by radiology nursing throughout the procedure under my direct supervision. FLUOROSCOPY TIME:  Fluoroscopic dose; 0.1 mGy COMPLICATIONS: None immediate. PROCEDURE: Informed written  consent was obtained from the patient and/or patient's representative after a discussion of the risks, benefits, and alternatives to treatment. Questions regarding the procedure were encouraged and answered. The RIGHT neck and chest were prepped with chlorhexidine in a sterile fashion, and a sterile drape was applied covering the operative field. Maximum barrier sterile technique with sterile gowns and gloves were used for the procedure. A timeout was performed prior to the initiation of the procedure. After creating a small venotomy incision, a micropuncture kit was utilized to access the internal jugular vein. Real-time ultrasound guidance was utilized for vascular access including the acquisition of a permanent ultrasound image  documenting patency of the accessed vessel. The microwire was utilized to measure appropriate catheter length. A stiff Glidewire was advanced to the level of the IVC and the micropuncture sheath was exchanged for a peel-away sheath. A palindrome tunneled hemodialysis catheter measuring 19 cm from tip to cuff was tunneled in a retrograde fashion from the anterior chest wall to the venotomy incision. The catheter was then placed through the peel-away sheath with tips ultimately positioned within the superior aspect of the right atrium. Final catheter positioning was confirmed and documented with a spot radiographic image. The catheter aspirates and flushes normally. The catheter was flushed with appropriate volume heparin dwells. The catheter exit site was secured with a 2-0 Ethilon retention suture. The venotomy incision was closed with Dermabond. Dressings were applied. The patient tolerated the procedure well without immediate post procedural complication. IMPRESSION: Successful placement of 19 cm tip to cuff tunneled hemodialysis catheter via the RIGHT internal jugular vein The tip of the catheter is positioned at the superior cavo-atrial junction. The catheter is ready for immediate use. Roanna Banning, MD [facility] Electronically Signed   By: Roanna Banning M.D.   On: 01/24/2023 16:22   IR US Guide Vasc Access Right  Result Date: 01/24/2023 INDICATION: dialysis catheter placement EXAM: TUNNELED CENTRAL VENOUS HEMODIALYSIS CATHETER PLACEMENT WITH ULTRASOUND AND FLUOROSCOPIC GUIDANCE MEDICATIONS: Ancef 1 gm IV . The antibiotic was given in an appropriate time interval prior to skin puncture. ANESTHESIA/SEDATION: Local anesthetic and single agent sedation was employed during this procedure. A total of Versed 1 mg was administered intravenously. The patient's level of consciousness and vital signs were monitored continuously by radiology nursing throughout  the procedure under my direct supervision. FLUOROSCOPY TIME:  Fluoroscopic dose; 0.1 mGy COMPLICATIONS: None immediate. PROCEDURE: Informed written consent was obtained from the patient and/or patient's representative after a discussion of the risks, benefits, and alternatives to treatment. Questions regarding the procedure were encouraged and answered. The RIGHT neck and chest were prepped with chlorhexidine in a sterile fashion, and a sterile drape was applied covering the operative field. Maximum barrier sterile technique with sterile gowns and gloves were used for the procedure. A timeout was performed prior to the initiation of the procedure. After creating a small venotomy incision, a micropuncture kit was utilized to access the internal jugular vein. Real-time ultrasound guidance was utilized for vascular access including the acquisition of a permanent ultrasound image documenting patency of the accessed vessel. The microwire was utilized to measure appropriate catheter length. A stiff Glidewire was advanced to the level of the IVC and the micropuncture sheath was exchanged for a peel-away sheath. A palindrome tunneled hemodialysis catheter measuring 19 cm from tip to cuff was tunneled in a retrograde fashion from the anterior chest wall to the venotomy incision. The catheter was then placed through the peel-away sheath with  tips ultimately positioned within the superior aspect of the right atrium. Final catheter positioning was confirmed and documented with a spot radiographic image. The catheter aspirates and flushes normally. The catheter was flushed with appropriate volume heparin dwells. The catheter exit site was secured with a 2-0 Ethilon retention suture. The venotomy incision was closed with Dermabond. Dressings were applied. The patient tolerated the procedure well without immediate post procedural complication. IMPRESSION: Successful placement of 19 cm tip to cuff tunneled hemodialysis catheter via  the RIGHT internal jugular vein The tip of the catheter is positioned at the superior cavo-atrial junction. The catheter is ready for immediate use. Roanna Banning, MD [facility] Electronically Signed   By: Roanna Banning M.D.   On: 01/24/2023 16:22   VAS [place] UPPER EXT VEIN MAPPING (PRE-OP AVF)  Result Date: 01/24/2023 UPPER EXTREMITY VEIN MAPPING Patient Name:  ELCHONON MAXSON  Date of Exam:   01/23/2023 Medical Rec #: [id number]              Accession #:    [id number] Date of Birth: Mar 11, 1951              Patient Gender: M Patient Age:   72 years Exam Location:  [facility] Procedure:      VAS [place] UPPER EXT VEIN MAPPING (PRE-OP AVF) Referring Phys: Lemar Livings --------------------------------------------------------------------------------  Indications: Pre-access. History: CKD 5.  Limitations: Bandaging, caliber of vessels Comparison Study: No prior studies. Performing Technologist: Jean Rosenthal RDMS, RVT  Examination Guidelines: A complete evaluation includes B-mode imaging, spectral Doppler, color Doppler, and power Doppler as needed of all accessible portions of each vessel. Bilateral testing is considered an integral part of a complete examination. Limited examinations for reoccurring indications may be performed as noted. +-----------------+-------------+----------+--------------+ Right Cephalic   Diameter (cm)Depth (cm)   Findings    +-----------------+-------------+----------+--------------+ Shoulder             0.10        0.63                  +-----------------+-------------+----------+--------------+ Prox upper arm       0.12        0.40                  +-----------------+-------------+----------+--------------+ Mid upper arm        0.08        0.19                  +-----------------+-------------+----------+--------------+ Dist upper arm                          not visualized  +-----------------+-------------+----------+--------------+ Antecubital fossa                         Bandaging    +-----------------+-------------+----------+--------------+ Prox forearm         0.36        0.38                  +-----------------+-------------+----------+--------------+ Mid forearm          0.31        0.17                  +-----------------+-------------+----------+--------------+ Dist forearm         0.30        0.23                  +-----------------+-------------+----------+--------------+  Wrist                0.20        0.59                  +-----------------+-------------+----------+--------------+ +-----------------+-------------+----------+---------+ Right Basilic    Diameter (cm)Depth (cm)Findings  +-----------------+-------------+----------+---------+ Mid upper arm        0.46                         +-----------------+-------------+----------+---------+ Dist upper arm       0.44                         +-----------------+-------------+----------+---------+ Antecubital fossa    0.34               branching +-----------------+-------------+----------+---------+ Prox forearm         0.36                         +-----------------+-------------+----------+---------+ Mid forearm          0.17                         +-----------------+-------------+----------+---------+ Distal forearm       0.19                         +-----------------+-------------+----------+---------+ Wrist                0.15                         +-----------------+-------------+----------+---------+ +-----------------+-------------+----------+--------------+ Left Cephalic    Diameter (cm)Depth (cm)   Findings    +-----------------+-------------+----------+--------------+ Shoulder                                not visualized +-----------------+-------------+----------+--------------+ Prox upper arm                          not  visualized +-----------------+-------------+----------+--------------+ Mid upper arm                           not visualized +-----------------+-------------+----------+--------------+ Dist upper arm                          not visualized +-----------------+-------------+----------+--------------+ Antecubital fossa                       not visualized +-----------------+-------------+----------+--------------+ Prox forearm         0.20        0.32                  +-----------------+-------------+----------+--------------+ Mid forearm          0.14        0.54                  +-----------------+-------------+----------+--------------+ Dist forearm         0.14        0.40                  +-----------------+-------------+----------+--------------+ Wrist                0.12  0.36                  +-----------------+-------------+----------+--------------+ +-----------------+-------------+----------+---------+ Left Basilic     Diameter (cm)Depth (cm)Findings  +-----------------+-------------+----------+---------+ Prox upper arm       0.58                         +-----------------+-------------+----------+---------+ Mid upper arm        0.29                         +-----------------+-------------+----------+---------+ Dist upper arm       0.36                         +-----------------+-------------+----------+---------+ Antecubital fossa    0.24                         +-----------------+-------------+----------+---------+ Prox forearm         0.18               branching +-----------------+-------------+----------+---------+ Mid forearm          0.18                         +-----------------+-------------+----------+---------+ Distal forearm       0.17                         +-----------------+-------------+----------+---------+ Wrist                0.17                          +-----------------+-------------+----------+---------+ *See table(s) above for measurements and observations.  Diagnosing physician: Lemar Livings MD Electronically signed by Lemar Livings MD on 01/24/2023 at 12:45:37 PM.    Final    ECHOCARDIOGRAM COMPLETE  Result Date: 01/23/2023    ECHOCARDIOGRAM REPORT   Patient Name:   KRISHANG READING Date of Exam: 01/23/2023 Medical Rec #:  [id number]             Height:       71.0 in Accession #:    [id number]            Weight:       213.8 lb Date of Birth:  12-Nov-1950             BSA:          2.169 m Patient Age:    72 years              BP:           171/84 mmHg Patient Gender: M                     HR:           84 bpm. Exam Location:  Inpatient Procedure: 2D Echo, Color Doppler and Cardiac Doppler Indications:    CHF  History:        Patient has no prior history of Echocardiogram examinations.                 CHF, PE, CKD and Stroke, Arrythmias:Atrial Fibrillation; Risk                 Factors:Hypertension and Diabetes.  Sonographer:    Milbert Coulter Referring Phys:  [id number] DEBBY CROSLEY  Sonographer Comments: Image acquisition challenging due to respiratory motion and Image acquisition challenging due to patient body habitus. IMPRESSIONS  1. Left ventricular ejection fraction, by estimation, is 55 to 60%. The left ventricle has normal function. The left ventricle has no regional wall motion abnormalities. There is moderate left ventricular hypertrophy. Left ventricular diastolic parameters are consistent with Grade I diastolic dysfunction (impaired relaxation).  2. Right ventricular systolic function is normal. The right ventricular size is normal.  3. The mitral valve is normal in structure. No evidence of mitral valve regurgitation. No evidence of mitral stenosis.  4. Degree of AR not well interrogated No suprasternal notch doppler and poor color flow but does not seen severe Consider f/u imaging for aortic root enlargment with CTA or MRA . The aortic valve is  tricuspid. There is mild calcification of the aortic valve. There is mild thickening of the aortic valve. Aortic valve regurgitation is mild. Aortic valve sclerosis is present, with no evidence of aortic valve stenosis.  5. Aortic dilatation noted. There is severe dilatation of the aortic root, measuring 48 mm.  6. The inferior vena cava is normal in size with greater than 50% respiratory variability, suggesting right atrial pressure of 3 mmHg. FINDINGS  Left Ventricle: Left ventricular ejection fraction, by estimation, is 55 to 60%. The left ventricle has normal function. The left ventricle has no regional wall motion abnormalities. The left ventricular internal cavity size was normal in size. There is  moderate left ventricular hypertrophy. Left ventricular diastolic parameters are consistent with Grade I diastolic dysfunction (impaired relaxation). Right Ventricle: The right ventricular size is normal. No increase in right ventricular wall thickness. Right ventricular systolic function is normal. Left Atrium: Left atrial size was normal in size. Right Atrium: Right atrial size was normal in size. Pericardium: There is no evidence of pericardial effusion. Mitral Valve: The mitral valve is normal in structure. No evidence of mitral valve regurgitation. No evidence of mitral valve stenosis. Tricuspid Valve: The tricuspid valve is normal in structure. Tricuspid valve regurgitation is not demonstrated. No evidence of tricuspid stenosis. Aortic Valve: Degree of AR not well interrogated No suprasternal notch doppler and poor color flow but does not seen severe Consider f/u imaging for aortic root enlargment with CTA or MRA. The aortic valve is tricuspid. There is mild calcification of the  aortic valve. There is mild thickening of the aortic valve. Aortic valve regurgitation is mild. Aortic valve sclerosis is present, with no evidence of aortic valve stenosis. Aortic valve mean gradient measures 4.0 mmHg. Aortic valve  peak gradient measures 7.2 mmHg. Aortic valve area, by VTI measures 3.68 cm. Pulmonic Valve: The pulmonic valve was normal in structure. Pulmonic valve regurgitation is not visualized. No evidence of pulmonic stenosis. Aorta: Aortic dilatation noted. There is severe dilatation of the aortic root, measuring 48 mm. Venous: The inferior vena cava is normal in size with greater than 50% respiratory variability, suggesting right atrial pressure of 3 mmHg. IAS/Shunts: No atrial level shunt detected by color flow Doppler.  LEFT VENTRICLE PLAX 2D LVIDd:         5.10 cm   Diastology LVIDs:         3.80 cm   LV e' medial:    7.94 cm/s LV PW:         1.10 cm   LV E/e' medial:  8.6 LV IVS:        1.40 cm   LV e' lateral:   7.07  cm/s LVOT diam:     2.30 cm   LV E/e' lateral: 9.6 LV SV:         100 LV SV Index:   46 LVOT Area:     4.15 cm  RIGHT VENTRICLE RV S prime:     19.10 cm/s TAPSE (M-mode): 2.9 cm LEFT ATRIUM             Index        RIGHT ATRIUM           Index LA diam:        3.40 cm 1.57 cm/m   RA Area:     16.10 cm LA Vol (A2C):   54.0 ml 24.89 ml/m  RA Volume:   42.40 ml  19.54 ml/m LA Vol (A4C):   56.6 ml 26.09 ml/m LA Biplane Vol: 59.3 ml 27.33 ml/m  AORTIC VALVE AV Area (Vmax):    3.75 cm AV Area (Vmean):   3.62 cm AV Area (VTI):     3.68 cm AV Vmax:           134.00 cm/s AV Vmean:          92.700 cm/s AV VTI:            0.271 m AV Peak Grad:      7.2 mmHg AV Mean Grad:      4.0 mmHg LVOT Vmax:         121.00 cm/s LVOT Vmean:        80.700 cm/s LVOT VTI:          0.240 m LVOT/AV VTI ratio: 0.89  AORTA Ao Root diam: 4.70 cm Ao Asc diam:  4.80 cm MITRAL VALVE                TRICUSPID VALVE MV Area (PHT): 4.36 cm     TR Peak grad:   39.7 mmHg MV Decel Time: 174 msec     TR Vmax:        315.00 cm/s MV E velocity: 68.00 cm/s MV A velocity: 145.00 cm/s  SHUNTS MV E/A ratio:  0.47         Systemic VTI:  0.24 m                             Systemic Diam: 2.30 cm Charlton Haws MD Electronically signed by Charlton Haws MD Signature Date/Time: 01/23/2023/1:35:14 PM    Final    US RENAL  Result Date: 01/22/2023 CLINICAL DATA:  Acute kidney injury EXAM: RENAL / URINARY TRACT ULTRASOUND COMPLETE COMPARISON:  CT abdomen pelvis 12/06/2022 FINDINGS: Right Kidney: Renal measurements: 11.4 x 4.9 x 4.7 cm = volume: 136 mL. Mildly thinned, echogenic renal cortex. No hydronephrosis. Left Kidney: Renal measurements: 10.8 x 6.1 x 5.5 cm = volume: 190 mL. Mildly thinned, echogenic renal cortex. No hydronephrosis. Bladder: Appears normal for degree of bladder distention. Other: None. IMPRESSION: Thinned echogenic renal cortices consistent with chronic medical renal disease. Electronically Signed   By: Acquanetta Belling M.D.   On: 01/22/2023 10:27   DG CHEST PORT 1 VIEW  Result Date: 01/22/2023 CLINICAL DATA:  Shortness of breath EXAM: PORTABLE CHEST 1 VIEW COMPARISON:  10/24/2010 and overlapping portions CT abdomen 12/06/2022 FINDINGS: Upper normal heart size. Basilar and infrahilar patchy densities suspicious for acute pulmonary edema given the elevated BNP. Bibasilar pneumonia is a less likely differential diagnostic consideration. Mildly prominent right hilum, probably vascular, less likely from  adenopathy. 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 Lower thoracic spondylosis. Costophrenic angles partially excluded although no obvious blunting of the visualized portion observed. IMPRESSION: 1. Patchy basilar and infrahilar densities suspicious for acute pulmonary edema given the elevated BNP. Bibasilar pneumonia is a less likely differential diagnostic consideration. 2. Mildly prominent right hilum, probably vascular, less likely from adenopathy. 3. 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 Electronically Signed   By: Gaylyn Rong M.D.   On: 01/22/2023 09:33      Discharge Exam: Vitals:   02/03/23 1000 02/03/23 1030  BP: 135/72 130/76  Pulse: 71 72  Resp: 14 19  Temp:    SpO2: 97% 97%    General: Pt  is alert, awake, not in acute distress Cardiovascular: RRR, S1/S2 +, no edema Respiratory: CTA bilaterally, no wheezing, no rhonchi, no respiratory distress, no conversational dyspnea  Abdominal: Soft, NT, ND, bowel sounds + Extremities: no edema, no cyanosis Psych: Normal mood and affect, stable judgement and insight     The results of significant diagnostics from this hospitalization (including imaging, microbiology, ancillary and laboratory) are listed below for reference.     Microbiology: Recent Results (from the past 240 hour(s))  MRSA Next Gen by PCR, Nasal     Status: None   Collection Time: 01/26/23  9:59 AM   Specimen: Nasal Mucosa; Nasal Swab  Result Value Ref Range Status   MRSA by PCR Next Gen NOT DETECTED NOT DETECTED Final    Comment: (NOTE) The GeneXpert MRSA Assay (FDA approved for NASAL specimens only), is one component of a comprehensive MRSA colonization surveillance program. It is not intended to diagnose MRSA infection nor to guide or monitor treatment for MRSA infections. Test performance is not FDA approved in patients less than 58 years old. Performed at [facility], [address], [place], [place] [address]      Labs: BNP (last 3 results) Recent Labs    01/22/23 0606  BNP 1,135.7*   Basic Metabolic Panel: Recent Labs  Lab 01/30/23 0200 01/31/23 0108 02/01/23 0043 02/02/23 0100 02/03/23 0115  NA 134* 135 135 134* 136  K 4.0 3.8 3.7 4.0 4.3  CL 99 99 97* 98 97*  CO2 27 24 28 26 22   GLUCOSE 198* 169* 162* 155* 162*  BUN 37* 49* 29* 40* 57*  CREATININE 4.92* 6.47* 4.71* 6.11* 7.24*  CALCIUM 7.4* 7.1* 7.3* 7.3* 7.3*  PHOS 3.9 5.3* 4.1 5.1* 6.0*   Liver Function Tests: Recent Labs  Lab 01/30/23 0200 01/31/23 0108 02/01/23 0043 02/02/23 0100 02/03/23 0115  ALBUMIN 2.2* 2.2* 2.2* 2.3* 2.5*   No results for input(s): "LIPASE", "AMYLASE" in the last 168 hours. No results for input(s): "AMMONIA" in the last 168  hours. CBC: Recent Labs  Lab 01/28/23 0041 01/29/23 0048  WBC 9.8 9.1  HGB 8.8* 8.5*  HCT 26.0* 26.6*  MCV 91.2 93.7  PLT 235 230   Cardiac Enzymes: No results for input(s): "CKTOTAL", "CKMB", "CKMBINDEX", "TROPONINI" in the last 168 hours. BNP: Invalid input(s): "POCBNP" CBG: Recent Labs  Lab 02/02/23 0553 02/02/23 1120 02/02/23 1507 02/02/23 2111 02/03/23 0624  GLUCAP 142* 192* 160* 148* 162*   D-Dimer No results for input(s): "DDIMER" in the last 72 hours. Hgb A1c No results for input(s): "HGBA1C" in the last 72 hours. Lipid Profile No results for input(s): "CHOL", "HDL", "LDLCALC", "TRIG", "CHOLHDL", "LDLDIRECT" in the last 72 hours. Thyroid function studies No results for input(s): "TSH", "T4TOTAL", "T3FREE", "THYROIDAB" in the last 72 hours.  Invalid input(s): "FREET3" Anemia work up No results for input(s): "VITAMINB12", "FOLATE", "FERRITIN", "TIBC", "IRON", "RETICCTPCT" in the last 72 hours. Urinalysis    Component Value Date/Time   COLORURINE STRAW (A) 01/22/2023 0856   APPEARANCEUR CLEAR 01/22/2023 0856   LABSPEC 1.008 01/22/2023 0856   PHURINE 5.0 01/22/2023 0856   GLUCOSEU NEGATIVE 01/22/2023 0856   HGBUR SMALL (A) 01/22/2023 0856   BILIRUBINUR NEGATIVE 01/22/2023 0856   KETONESUR NEGATIVE 01/22/2023 0856   PROTEINUR >=300 (A) 01/22/2023 0856   UROBILINOGEN 0.2 10/22/2010 1000   NITRITE NEGATIVE 01/22/2023 0856   LEUKOCYTESUR NEGATIVE 01/22/2023 0856   Sepsis Labs Recent Labs  Lab 01/28/23 0041 01/29/23 0048  WBC 9.8 9.1   Microbiology Recent Results (from the past 240 hour(s))  MRSA Next Gen by PCR, Nasal     Status: None   Collection Time: 01/26/23  9:59 AM   Specimen: Nasal Mucosa; Nasal Swab  Result Value Ref Range Status   MRSA by PCR Next Gen NOT DETECTED NOT DETECTED Final    Comment: (NOTE) The GeneXpert MRSA Assay (FDA approved for NASAL specimens only), is one component of a comprehensive MRSA colonization  surveillance program. It is not intended to diagnose MRSA infection nor to guide or monitor treatment for MRSA infections. Test performance is not FDA approved in patients less than 64 years old. Performed at [facility], [address], [place], [place] [address]      Patient was seen and examined on the day of discharge and was found to be in stable condition. Time coordinating discharge: 40 minutes including assessment and coordination of care, as well as examination of the patient.   SIGNED:  Noralee Stain, DO [facility] 02/03/2023, 10:37 AM

## 2023-02-03 NOTE — Procedures (Signed)
I was present at this dialysis session. I have reviewed the session itself and made appropriate changes.   Doing well on HD via R internal jugular TDC.  LUE AVF +Bruit on exam.  Goal UF 2L, on 3K, tolerating well.  CLIP completed to [facility] in [place] [facility].  Plan to start tomorrow as outpt.     Filed Weights   02/02/23 0401 02/03/23 0509 02/03/23 0805  Weight: 89.7 kg 89 kg 89.4 kg    Recent Labs  Lab 02/03/23 0115  NA 136  K 4.3  CL 97*  CO2 22  GLUCOSE 162*  BUN 57*  CREATININE 7.24*  CALCIUM 7.3*  PHOS 6.0*    Recent Labs  Lab 01/28/23 0041 01/29/23 0048  WBC 9.8 9.1  HGB 8.8* 8.5*  HCT 26.0* 26.6*  MCV 91.2 93.7  PLT 235 230    Scheduled Meds:  apixaban  5 mg Oral BID   atorvastatin  40 mg Oral Daily   Chlorhexidine Gluconate Cloth  6 each Topical Q0600   darbepoetin (ARANESP) injection - DIALYSIS  60 mcg Subcutaneous Q Sat-1800   diltiazem  180 mg Oral Daily   feeding supplement (GLUCERNA SHAKE)  237 mL Oral TID BM   insulin aspart  0-15 Units Subcutaneous TID WC   insulin aspart  0-5 Units Subcutaneous QHS   levothyroxine  75 mcg Oral QAC breakfast   metoprolol succinate  50 mg Oral Daily   multivitamin  1 tablet Oral QHS   polyethylene glycol  17 g Oral Daily   Continuous Infusions: PRN Meds:.acetaminophen, glucagon (human recombinant), guaiFENesin, hydrALAZINE, HYDROmorphone (DILAUDID) injection, ipratropium-albuterol, ondansetron (ZOFRAN) IV, oxyCODONE, senna-docusate, traZODone   Sabra Heck  MD 02/03/2023, 10:52 AM

## 2023-02-05 ENCOUNTER — Telehealth: Payer: Self-pay | Admitting: Physician Assistant

## 2023-02-05 NOTE — Telephone Encounter (Signed)
-----   Message from Graceann Congress sent at 01/28/2023 11:04 AM EDT ----- S/p left brachiobasilic AV fistula creation by Dr. Karin Lieu. He needs follow up in 4-6 weeks with fistula duplex. thanks

## 2023-02-17 NOTE — Telephone Encounter (Signed)
Appointment has been scheduled per Joy from [place].

## 2023-03-03 ENCOUNTER — Ambulatory Visit (HOSPITAL_COMMUNITY)
Admission: RE | Admit: 2023-03-03 | Discharge: 2023-03-03 | Disposition: A | Discharge status: Home / Self Care | Payer: Self-pay | Source: Ambulatory Visit | Attending: Nephrology | Admitting: Nephrology

## 2023-03-03 ENCOUNTER — Other Ambulatory Visit (HOSPITAL_COMMUNITY): Payer: Self-pay | Admitting: Nephrology

## 2023-03-03 DIAGNOSIS — N186 End stage renal disease: Secondary | ICD-10-CM

## 2023-03-03 DIAGNOSIS — Z4901 Encounter for fitting and adjustment of extracorporeal dialysis catheter: Secondary | ICD-10-CM | POA: Insufficient documentation

## 2023-03-03 HISTORY — PX: IR FLUORO GUIDE CV LINE RIGHT: IMG2283

## 2023-03-03 MED ORDER — HEPARIN SODIUM (PORCINE) 1000 UNIT/ML IJ SOLN
10.0000 mL | Freq: Once | INTRAMUSCULAR | Status: AC
  Administered 2023-03-03: 3.8 mL via INTRAVENOUS

## 2023-03-03 MED ORDER — LIDOCAINE HCL 1 % IJ SOLN
20.0000 mL | Freq: Once | INTRAMUSCULAR | Status: AC
  Administered 2023-03-03: 10 mL via INTRADERMAL

## 2023-03-03 MED ORDER — HEPARIN SODIUM (PORCINE) 1000 UNIT/ML IJ SOLN
INTRAMUSCULAR | Status: AC
  Filled 2023-03-03: qty 10

## 2023-03-03 MED ORDER — CEFAZOLIN SODIUM-DEXTROSE 2-4 GM/100ML-% IV SOLN
2.0000 g | Freq: Once | INTRAVENOUS | Status: AC
  Administered 2023-03-03: 2 g via INTRAVENOUS

## 2023-03-03 MED ORDER — LIDOCAINE HCL 1 % IJ SOLN
INTRAMUSCULAR | Status: AC
  Filled 2023-03-03: qty 20

## 2023-03-03 MED ORDER — CEFAZOLIN SODIUM-DEXTROSE 2-4 GM/100ML-% IV SOLN
INTRAVENOUS | Status: AC
  Filled 2023-03-03: qty 100

## 2023-03-03 MED ORDER — CHLORHEXIDINE GLUCONATE 4 % EX SOLN
CUTANEOUS | Status: AC
  Filled 2023-03-03: qty 15

## 2023-03-03 NOTE — Procedures (Signed)
Interventional Radiology Procedure Note  Procedure: Exchange of a right IJ approach tunneled HD cath. New 23cm tip to cuff.  Tip is positioned at the superior cavoatrial junction and catheter is ready for immediate use.  Complications: None  Recommendations:  - Ok to use - Do not submerge - Routine line care   Signed,  Yvone Neu. Loreta Ave, DO, ABVM, RPVI

## 2023-03-13 ENCOUNTER — Other Ambulatory Visit: Payer: Self-pay

## 2023-03-13 DIAGNOSIS — N186 End stage renal disease: Secondary | ICD-10-CM

## 2023-03-21 ENCOUNTER — Ambulatory Visit (INDEPENDENT_AMBULATORY_CARE_PROVIDER_SITE_OTHER): Payer: Self-pay | Admitting: Physician Assistant

## 2023-03-21 ENCOUNTER — Ambulatory Visit (HOSPITAL_COMMUNITY)
Admission: RE | Admit: 2023-03-21 | Discharge: 2023-03-21 | Disposition: A | Discharge status: Home / Self Care | Payer: Self-pay | Source: Ambulatory Visit | Attending: Vascular Surgery | Admitting: Vascular Surgery

## 2023-03-21 VITALS — BP 123/72 | HR 62 | Temp 97.9°F | Resp 14 | Ht 70.0 in | Wt 197.4 lb

## 2023-03-21 DIAGNOSIS — N186 End stage renal disease: Secondary | ICD-10-CM

## 2023-03-21 NOTE — Progress Notes (Signed)
POST OPERATIVE OFFICE NOTE    CC:  F/u for surgery  HPI:  This is a 72 y.o. male who is s/p first stage basilic fistula on 01/28/23 by Dr. Karin Lieu.  He is on HD via [facility]  Marland Kitchen    Pt returns today for follow up.  Pt states he has no pain, loss of sensation or loss of motor. His daughter is with him, he does not speak english, so she translated.    No Known Allergies  Current Outpatient Medications  Medication Sig Dispense Refill   ferric citrate (AURYXIA) 1 GM 210 MG(Fe) tablet Take 210 mg by mouth 3 (three) times daily with meals.     apixaban (ELIQUIS) 5 MG TABS tablet Take 1 tablet (5 mg total) by mouth 2 (two) times daily. 60 tablet 1   atorvastatin (LIPITOR) 40 MG tablet Take 40 mg by mouth daily.     diltiazem (CARDIZEM CD) 180 MG 24 hr capsule Take 1 capsule (180 mg total) by mouth daily. 30 capsule 1   glipiZIDE (GLUCOTROL) 5 MG tablet Take 1 tablet (5 mg total) by mouth daily before breakfast. 30 tablet 1   levothyroxine (SYNTHROID) 75 MCG tablet Take 75 mcg by mouth daily before breakfast. (Patient not taking: Reported on 03/21/2023)     metoprolol succinate (TOPROL-XL) 50 MG 24 hr tablet Take 1 tablet (50 mg total) by mouth daily. Take with or immediately following a meal. 30 tablet 1   No current facility-administered medications for this visit.     ROS:  See HPI  Physical Exam:    Incision:  well healed with palpable arterial  anastomosis thrill Extremities:  N/M/V intact no symptoms of steal  Findings:  +--------------------+----------+-----------------+--------+  AVF                PSV (cm/s)Flow Vol (mL/min)Comments  +--------------------+----------+-----------------+--------+  Native artery inflow   180           885                 +--------------------+----------+-----------------+--------+  AVF Anastomosis        649                               +--------------------+----------+-----------------+--------+      +------------+----------+-------------+----------+----------------+  OUTFLOW VEINPSV (cm/s)Diameter (cm)Depth (cm)    Describe      +------------+----------+-------------+----------+----------------+  Prox UA         89        0.72        1.04                     +------------+----------+-------------+----------+----------------+  Mid UA         104        0.74        0.54                     +------------+----------+-------------+----------+----------------+  Dist UA        481        0.74        0.41   competing branch  +------------+----------+-------------+----------+----------------+  AC Fossa       576        0.48        0.46                     +------------+----------+-------------+----------+----------------+  Summary:  Patent arteriovenous fistula.     Assessment/Plan:  This is a 72 y.o. male who is s/p:first stage basilic fistula creation He has excellent results and the fistula is maturing well.  -He will be scheduled for second stage basilic transposition with DR. Robins.  He is on Eliquis  and has HD TTS.  He has a working right internal jugular TDC. [facility] was placed by Dr. Arrie Aran.   Mosetta Pigeon PA-C [facility] [phone]   Clinic MD:  Karin Lieu

## 2023-03-27 ENCOUNTER — Telehealth: Payer: Self-pay | Admitting: *Deleted

## 2023-03-28 ENCOUNTER — Other Ambulatory Visit: Payer: Self-pay

## 2023-03-28 ENCOUNTER — Telehealth: Payer: Self-pay

## 2023-03-28 DIAGNOSIS — N186 End stage renal disease: Secondary | ICD-10-CM

## 2023-03-28 NOTE — Telephone Encounter (Signed)
Attempted to reach pt to schedule his surgery. Tried both lines and was able to LVM on one of them. Joy at [facility] has been made aware of this.

## 2023-04-08 NOTE — Addendum Note (Signed)
Encounter addended by: Edward Qualia on: 04/08/2023 4:22 PM  Actions taken: Imaging Exam ended

## 2023-04-11 ENCOUNTER — Encounter (HOSPITAL_COMMUNITY): Payer: Self-pay | Admitting: Vascular Surgery

## 2023-04-11 NOTE — Anesthesia Preprocedure Evaluation (Signed)
Anesthesia Evaluation  Patient identified by MRN, date of birth, ID band Patient awake    Reviewed: Allergy & Precautions, NPO status , Patient's Chart, lab work & pertinent test results, reviewed documented beta blocker date and time   History of Anesthesia Complications Negative for: history of anesthetic complications  Airway Mallampati: II  TM Distance: >3 FB Neck ROM: Full    Dental  (+) Dental Advisory Given   Pulmonary neg pulmonary ROS   breath sounds clear to auscultation       Cardiovascular hypertension, Pt. on medications and Pt. on home beta blockers (-) angina + dysrhythmias Atrial Fibrillation  Rhythm:Irregular Rate:Normal  01/2023 ECHO: EF 55 to 60%.  1. The LV has normal function, no regional wall motion abnormalities. There is moderate LVH. Grade I diastolic dysfunction (impaired relaxation).   2. RVF is normal. The right ventricular size is normal.   3. The mitral valve is normal in structure. No evidence of mitral valve regurgitation. No evidence of mitral stenosis.   4. Degree of AR not well interrogated No suprasternal notch doppler and  poor color flow but does not seen severe Consider f/u imaging for aortic  root enlargment with CTA or MRA . The aortic valve is tricuspid. There is  mild calcification of the aortic valve. There is mild thickening of the aortic valve. Aortic valve regurgitation is mild. Aortic valve sclerosis is present, with no evidence  of aortic valve stenosis.   5. Aortic dilatation noted, severe dilatation of the aortic root, measuring 48 mm.     Neuro/Psych CVA, No Residual Symptoms  negative psych ROS   GI/Hepatic negative GI ROS, Neg liver ROS,,,  Endo/Other  diabetes (glu 104), Oral Hypoglycemic Agents    Renal/GU Dialysis and ESRFRenal disease     Musculoskeletal   Abdominal   Peds  Hematology eliquis   Anesthesia Other Findings   Reproductive/Obstetrics                              Anesthesia Physical Anesthesia Plan  ASA: 3  Anesthesia Plan: MAC and Regional   Post-op Pain Management: Tylenol PO (pre-op)* and Minimal or no pain anticipated   Induction:   PONV Risk Score and Plan: 1 and Ondansetron and Treatment may vary due to age or medical condition  Airway Management Planned: Natural Airway and Simple Face Mask  Additional Equipment: None  Intra-op Plan:   Post-operative Plan:   Informed Consent: I have reviewed the patients History and Physical, chart, labs and discussed the procedure including the risks, benefits and alternatives for the proposed anesthesia with the patient or authorized representative who has indicated his/her understanding and acceptance.     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  Recently admitted at [facility] 6/27 through 01/20/2023 with acute CVA.  He presented to [facility] ED on 01/22/2023 after developing sudden shortness of breath and change in mental status.  He was found to have elevated BNP, creatinine 5.96.  Initially markedly hypertensive with blood pressure 198/98.  During admission he was initiated on dialysis with improvement of symptoms.  Echo  during recent admission at [facility] showed concern for severely dilated aortic root measuring 48 mm.  Recent echo done at [facility] on 01/17/2023 measured aortic root at 40 mm.  I reviewed the images from the echo 01/23/2023 with anesthesiologist Dr. Ace Gins. He did state that it appeared the root may be enlarged, but poor  image quality limits the interpretation.  He recommended the patient have follow-up with more definitive imaging, however, stated it should not affect his ability to proceed with BVT as scheduled on Monday.  I also reached out to vascular surgeon Dr. Karin Lieu to discuss.  Per surgery posting, patient instructed to hold Eliquis 3 days.  Patient will need day of surgery labs and evaluation.  EKG 01/30/2023: Atrial fibrillation with RVR.  Rate 142.  Septal infarct, age undetermined.  TTE 01/23/2023: 1. Left ventricular ejection fraction, by estimation, is 55 to 60%. The  left ventricle has normal function. The left ventricle has no regional  wall motion abnormalities. There is moderate left ventricular hypertrophy.  Left ventricular diastolic  parameters are consistent with Grade I diastolic dysfunction (impaired  relaxation).  2. Right ventricular systolic function is normal. The right ventricular  size is normal.  3. The mitral valve is normal in structure. No evidence of mitral valve  regurgitation. No evidence of mitral stenosis.  4. Degree of AR not well interrogated No suprasternal notch doppler and  poor color flow but does not seen severe Consider f/u imaging for aortic  root enlargment with CTA or MRA . The aortic valve is tricuspid. There is  mild calcification of the aortic  valve. There is mild thickening of the aortic valve. Aortic valve  regurgitation is mild. Aortic valve sclerosis is present, with no evidence  of aortic valve stenosis.  5. Aortic dilatation noted. There is severe dilatation of the aortic  root, measuring 48 mm.  6. The inferior vena cava is normal in size with greater than 50%  respiratory variability, suggesting right atrial pressure of 3 mmHg.   TTE 01/17/2023: Summary  1. The left ventricle is normal in size with normal wall thickness.  2. The left ventricular systolic function is normal, LVEF is visually  estimated at > 55%.  3. There is  grade I diastolic dysfunction (impaired relaxation).  4. The left atrium is mildly dilated in size.  5. The right ventricle is normal in size, with normal systolic function.  6. There is moderate pulmonary hypertension.  7. There is no evidence of an interatrial flow communication or  intrapulmonary shunt by agitated saline study.   Aorta  Dilated aortic root and ascending aorta measuring 4.0 and 3.8 cm  respectively.   TTE 11/27/2020: Summary  1. Technically difficult study.  2. The left ventricle is normal in size with mildly increased wall  thickness.  3. The left ventricular systolic function is normal, LVEF is visually  estimated at 55-60%.  4. There is grade I diastolic dysfunction (impaired relaxation).  5. There is mild aortic regurgitation.  6. The left atrium is mildly dilated in size.  7. The right ventricle is normal in size, with normal systolic function.  8. Dilated aortic root and ascending aorta measuring 4.2 and 3.9 cm  respectively.    )        Anesthesia Quick Evaluation

## 2023-04-11 NOTE — Progress Notes (Signed)
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  Recently admitted at [facility] 6/27 through 01/20/2023 with acute CVA.  He presented to [facility] ED on 01/22/2023 after developing sudden shortness of breath and change in mental status.  He was found to have elevated BNP, creatinine 5.96.  Initially markedly hypertensive with blood pressure 198/98.  During admission he was initiated on dialysis with improvement of symptoms.  Echo during recent admission at [facility] showed concern for severely dilated aortic root measuring 48 mm.  Recent echo done at [facility] on 01/17/2023 measured aortic root at 40 mm.  I reviewed the images from the echo 01/23/2023 with anesthesiologist Dr. Ace Gins. He did state that it appeared the root may be enlarged, but poor image quality limits the interpretation.  He recommended the patient have follow-up with more definitive imaging, however, stated it should not affect his ability to proceed with BVT as scheduled on Monday.  I also reached out to vascular surgeon Dr. Karin Lieu to discuss.  Per surgery posting, patient instructed to hold Eliquis 3 days.  Patient will need day of surgery labs and evaluation.  EKG 01/30/2023: Atrial fibrillation with RVR.  Rate 142.  Septal infarct, age undetermined.  TTE 01/23/2023:  1. Left ventricular ejection fraction, by estimation, is 55 to 60%. The  left ventricle has normal function. The left ventricle has no regional  wall motion abnormalities. There is moderate left ventricular hypertrophy.  Left ventricular diastolic  parameters are consistent with Grade I diastolic dysfunction (impaired  relaxation).   2. Right ventricular systolic function is normal. The right ventricular  size is normal.   3. The mitral valve is normal  in structure. No evidence of mitral valve  regurgitation. No evidence of mitral stenosis.   4. Degree of AR not well interrogated No suprasternal notch doppler and  poor color flow but does not seen severe Consider f/u imaging for aortic  root enlargment with CTA or MRA . The aortic valve is tricuspid. There is  mild calcification of the aortic  valve. There is mild thickening of the aortic valve. Aortic valve  regurgitation is mild. Aortic valve sclerosis is present, with no evidence  of aortic valve stenosis.   5. Aortic dilatation noted. There is severe dilatation of the aortic  root, measuring 48 mm.   6. The inferior vena cava is normal in size with greater than 50%  respiratory variability, suggesting right atrial pressure of 3 mmHg.   TTE 01/17/2023: Summary   1. The left ventricle is normal in size with normal wall thickness.    2. The left ventricular systolic function is normal, LVEF is visually  estimated at > 55%.    3. There is grade I diastolic dysfunction (impaired relaxation).    4. The left atrium is mildly dilated in size.    5. The right ventricle is normal in size, with normal systolic function.    6. There is moderate pulmonary hypertension.    7. There is no evidence of an interatrial flow communication or  intrapulmonary shunt by agitated saline study.   Aorta   Dilated aortic root and ascending aorta measuring 4.0 and 3.8 cm  respectively.   TTE 11/27/2020: Summary   1. Technically difficult study.    2. The left ventricle is normal in size with mildly  increased wall  thickness.   3. The left ventricular systolic function is normal, LVEF is visually  estimated at 55-60%.    4. There is grade I diastolic dysfunction (impaired relaxation).    5. There is mild aortic regurgitation.    6. The left atrium is mildly dilated in size.    7. The right ventricle is normal in size, with normal systolic function.    8. Dilated aortic root and ascending aorta  measuring 4.2 and 3.9 cm  respectively.     Zannie Cove [facility] Short Stay Center/Anesthesiology Phone [phone] 04/11/2023 10:42 AM

## 2023-04-11 NOTE — Progress Notes (Signed)
SDW call  Patient's daughter, [name] was given pre-op instructions over the phone. She verbalized understanding of instructions provided.     PCP - Tomma Rakers, FNP Cardiologist -  Pulmonary:    PPM/ICD - denies Device Orders - n/a Rep Notified - n/a   Chest x-ray - 01/22/2023 EKG -  01/30/2023 Stress Test - ECHO - 01/23/2023 Cardiac Cath -   Sleep Study/sleep apnea/CPAP: denies  Type II diabetic Fasting Blood sugar range: 200-250 How often check sugars: daily Glipizide, instructed to hold DOS   Blood Thinner Instructions: Eliquis, states last dose 04/10/2023 Aspirin Instructions:denies   ERAS Protcol - NPO   COVID TEST- n/a    Anesthesia review: Yes. HTN, DM, renal failure with dialysis    Your procedure is scheduled on Monday April 14, 2023  Report to [facility] Main Entrance "A" at  0830  A.M., then check in with the Admitting office.  Call this number if you have problems the morning of surgery:  [phone]   If you have any questions prior to your surgery date call [phone]: Open Monday-Friday 8am-4pm If you experience any cold or flu symptoms such as cough, fever, chills, shortness of breath, etc. between now and your scheduled surgery, please notify us at the above number     Remember:  Do not eat or drink after midnight the night before your surgery  Take these medicines the morning of surgery with A SIP OF WATER:  Diltiazem, metoprolol  As of today, STOP taking any Aspirin (unless otherwise instructed by your surgeon) Aleve, Naproxen, Ibuprofen, Motrin, Advil, Goody's, BC's, all herbal medications, fish oil, and all vitamins.

## 2023-04-14 ENCOUNTER — Ambulatory Visit (HOSPITAL_COMMUNITY)
Admission: RE | Admit: 2023-04-14 | Discharge: 2023-04-14 | Disposition: A | Discharge status: Home / Self Care | Payer: Self-pay | Attending: Vascular Surgery | Admitting: Vascular Surgery

## 2023-04-14 ENCOUNTER — Ambulatory Visit (HOSPITAL_COMMUNITY): Payer: Self-pay | Admitting: Physician Assistant

## 2023-04-14 ENCOUNTER — Encounter (HOSPITAL_COMMUNITY)
Admission: RE | Disposition: A | Discharge status: Home / Self Care | Payer: Self-pay | Source: Home / Self Care | Attending: Vascular Surgery

## 2023-04-14 ENCOUNTER — Other Ambulatory Visit: Payer: Self-pay

## 2023-04-14 ENCOUNTER — Other Ambulatory Visit (HOSPITAL_COMMUNITY): Payer: Self-pay

## 2023-04-14 ENCOUNTER — Ambulatory Visit (HOSPITAL_BASED_OUTPATIENT_CLINIC_OR_DEPARTMENT_OTHER): Payer: Self-pay | Admitting: Physician Assistant

## 2023-04-14 ENCOUNTER — Encounter (HOSPITAL_COMMUNITY): Payer: Self-pay | Admitting: Vascular Surgery

## 2023-04-14 DIAGNOSIS — Z992 Dependence on renal dialysis: Secondary | ICD-10-CM | POA: Insufficient documentation

## 2023-04-14 DIAGNOSIS — E1122 Type 2 diabetes mellitus with diabetic chronic kidney disease: Secondary | ICD-10-CM | POA: Insufficient documentation

## 2023-04-14 DIAGNOSIS — N186 End stage renal disease: Secondary | ICD-10-CM

## 2023-04-14 DIAGNOSIS — Z7901 Long term (current) use of anticoagulants: Secondary | ICD-10-CM | POA: Insufficient documentation

## 2023-04-14 DIAGNOSIS — I120 Hypertensive chronic kidney disease with stage 5 chronic kidney disease or end stage renal disease: Secondary | ICD-10-CM | POA: Insufficient documentation

## 2023-04-14 DIAGNOSIS — Z7984 Long term (current) use of oral hypoglycemic drugs: Secondary | ICD-10-CM | POA: Insufficient documentation

## 2023-04-14 DIAGNOSIS — N185 Chronic kidney disease, stage 5: Secondary | ICD-10-CM

## 2023-04-14 HISTORY — PX: BASCILIC VEIN TRANSPOSITION: SHX5742

## 2023-04-14 LAB — POCT I-STAT, CHEM 8
BUN: 50 mg/dL — ABNORMAL HIGH (ref 8–23)
Calcium, Ion: 0.89 mmol/L — CL (ref 1.15–1.40)
Chloride: 104 mmol/L (ref 98–111)
Creatinine, Ser: 7.6 mg/dL — ABNORMAL HIGH (ref 0.61–1.24)
Glucose, Bld: 110 mg/dL — ABNORMAL HIGH (ref 70–99)
HCT: 34 % — ABNORMAL LOW (ref 39.0–52.0)
Hemoglobin: 11.6 g/dL — ABNORMAL LOW (ref 13.0–17.0)
Potassium: 4 mmol/L (ref 3.5–5.1)
Sodium: 142 mmol/L (ref 135–145)
TCO2: 25 mmol/L (ref 22–32)

## 2023-04-14 LAB — GLUCOSE, CAPILLARY
Glucose-Capillary: 104 mg/dL — ABNORMAL HIGH (ref 70–99)
Glucose-Capillary: 124 mg/dL — ABNORMAL HIGH (ref 70–99)
Glucose-Capillary: 116 mg/dL — ABNORMAL HIGH (ref 70–99)

## 2023-04-14 SURGERY — TRANSPOSITION, VEIN, BASILIC
Anesthesia: Monitor Anesthesia Care | Site: Arm Upper | Laterality: Left

## 2023-04-14 MED ORDER — PROPOFOL 10 MG/ML IV BOLUS
INTRAVENOUS | Status: AC
  Filled 2023-04-14: qty 20

## 2023-04-14 MED ORDER — HEMOSTATIC AGENTS (NO CHARGE) OPTIME
TOPICAL | Status: DC | PRN
  Administered 2023-04-14: 2 via TOPICAL

## 2023-04-14 MED ORDER — CEFAZOLIN SODIUM-DEXTROSE 2-4 GM/100ML-% IV SOLN
INTRAVENOUS | Status: AC
  Filled 2023-04-14: qty 100

## 2023-04-14 MED ORDER — LIDOCAINE-EPINEPHRINE (PF) 1.5 %-1:200000 IJ SOLN
INTRAMUSCULAR | Status: DC | PRN
  Administered 2023-04-14: 30 mL via PERINEURAL

## 2023-04-14 MED ORDER — FENTANYL CITRATE (PF) 250 MCG/5ML IJ SOLN
INTRAMUSCULAR | Status: AC
  Filled 2023-04-14: qty 5

## 2023-04-14 MED ORDER — OXYCODONE HCL 5 MG PO TABS: 5.0000 mg | ORAL_TABLET | Freq: Once | ORAL | Status: DC | PRN

## 2023-04-14 MED ORDER — OXYCODONE HCL 5 MG/5ML PO SOLN: 5.0000 mg | Freq: Once | ORAL | Status: DC | PRN

## 2023-04-14 MED ORDER — CEFAZOLIN SODIUM-DEXTROSE 2-4 GM/100ML-% IV SOLN
2.0000 g | INTRAVENOUS | Status: AC
  Administered 2023-04-14: 2 g via INTRAVENOUS

## 2023-04-14 MED ORDER — FENTANYL CITRATE (PF) 100 MCG/2ML IJ SOLN: 50.0000 ug | Freq: Once | INTRAMUSCULAR | Status: AC

## 2023-04-14 MED ORDER — CHLORHEXIDINE GLUCONATE 0.12 % MT SOLN: 15.0000 mL | Freq: Once | OROMUCOSAL | Status: AC

## 2023-04-14 MED ORDER — SODIUM CHLORIDE 0.9 % IV SOLN: INTRAVENOUS | Status: DC

## 2023-04-14 MED ORDER — 0.9 % SODIUM CHLORIDE (POUR BTL) OPTIME
TOPICAL | Status: DC | PRN
  Administered 2023-04-14: 1000 mL

## 2023-04-14 MED ORDER — LIDOCAINE 2% (20 MG/ML) 5 ML SYRINGE
INTRAMUSCULAR | Status: DC | PRN
  Administered 2023-04-14: 40 mg via INTRAVENOUS

## 2023-04-14 MED ORDER — FENTANYL CITRATE (PF) 100 MCG/2ML IJ SOLN
25.0000 ug | INTRAMUSCULAR | Status: DC | PRN
  Administered 2023-04-14: 50 ug via INTRAVENOUS

## 2023-04-14 MED ORDER — MEPERIDINE HCL 25 MG/ML IJ SOLN: 6.2500 mg | INTRAMUSCULAR | Status: DC | PRN

## 2023-04-14 MED ORDER — MIDAZOLAM HCL 2 MG/2ML IJ SOLN: 1.0000 mg | Freq: Once | INTRAMUSCULAR | Status: AC

## 2023-04-14 MED ORDER — CHLORHEXIDINE GLUCONATE 4 % EX SOLN: 60.0000 mL | Freq: Once | CUTANEOUS | Status: DC

## 2023-04-14 MED ORDER — HEPARIN SODIUM (PORCINE) 1000 UNIT/ML IJ SOLN
INTRAMUSCULAR | Status: DC | PRN
  Administered 2023-04-14: 3000 [IU] via INTRAVENOUS

## 2023-04-14 MED ORDER — HEPARIN 6000 UNIT IRRIGATION SOLUTION
Status: DC | PRN
  Administered 2023-04-14: 1

## 2023-04-14 MED ORDER — MIDAZOLAM HCL 2 MG/2ML IJ SOLN: 0.5000 mg | Freq: Once | INTRAMUSCULAR | Status: DC | PRN

## 2023-04-14 MED ORDER — ACETAMINOPHEN 500 MG PO TABS
1000.0000 mg | ORAL_TABLET | Freq: Once | ORAL | Status: AC
  Administered 2023-04-14: 1000 mg via ORAL
  Filled 2023-04-14: qty 2

## 2023-04-14 MED ORDER — PROTAMINE SULFATE 10 MG/ML IV SOLN
INTRAVENOUS | Status: DC | PRN
  Administered 2023-04-14: 20 mg via INTRAVENOUS

## 2023-04-14 MED ORDER — PROMETHAZINE HCL 25 MG/ML IJ SOLN: 6.2500 mg | INTRAMUSCULAR | Status: DC | PRN

## 2023-04-14 MED ORDER — ORAL CARE MOUTH RINSE: 15.0000 mL | Freq: Once | OROMUCOSAL | Status: AC

## 2023-04-14 MED ORDER — OXYCODONE-ACETAMINOPHEN 5-325 MG PO TABS
1.0000 | ORAL_TABLET | Freq: Four times a day (QID) | ORAL | 0 refills | Status: DC | PRN
  Filled 2023-04-14: qty 20, 5d supply, fill #0

## 2023-04-14 MED ORDER — FENTANYL CITRATE (PF) 100 MCG/2ML IJ SOLN
INTRAMUSCULAR | Status: AC
  Administered 2023-04-14: 50 ug via INTRAVENOUS
  Filled 2023-04-14: qty 2

## 2023-04-14 MED ORDER — HEPARIN 6000 UNIT IRRIGATION SOLUTION
Status: AC
  Filled 2023-04-14: qty 500

## 2023-04-14 MED ORDER — FENTANYL CITRATE (PF) 100 MCG/2ML IJ SOLN
INTRAMUSCULAR | Status: AC
  Filled 2023-04-14: qty 2

## 2023-04-14 MED ORDER — LIDOCAINE-EPINEPHRINE (PF) 1 %-1:200000 IJ SOLN
INTRAMUSCULAR | Status: AC
  Filled 2023-04-14: qty 30

## 2023-04-14 MED ORDER — CHLORHEXIDINE GLUCONATE 0.12 % MT SOLN
OROMUCOSAL | Status: AC
  Administered 2023-04-14: 15 mL via OROMUCOSAL
  Filled 2023-04-14: qty 15

## 2023-04-14 MED ORDER — MIDAZOLAM HCL 2 MG/2ML IJ SOLN
INTRAMUSCULAR | Status: AC
  Administered 2023-04-14: 1 mg via INTRAVENOUS
  Filled 2023-04-14: qty 2

## 2023-04-14 MED ORDER — INSULIN ASPART 100 UNIT/ML IJ SOLN: 0.0000 [IU] | INTRAMUSCULAR | Status: DC | PRN

## 2023-04-14 MED ORDER — ONDANSETRON HCL 4 MG/2ML IJ SOLN
INTRAMUSCULAR | Status: DC | PRN
  Administered 2023-04-14: 4 mg via INTRAVENOUS

## 2023-04-14 MED ORDER — PROPOFOL 500 MG/50ML IV EMUL
INTRAVENOUS | Status: DC | PRN
  Administered 2023-04-14: 75 ug/kg/min via INTRAVENOUS

## 2023-04-14 SURGICAL SUPPLY — 40 items
ADH SKN CLS APL DERMABOND .7 (GAUZE/BANDAGES/DRESSINGS) ×1
ARMBAND PINK RESTRICT EXTREMIT (MISCELLANEOUS) ×1 IMPLANT
BAG COUNTER SPONGE SURGICOUNT (BAG) ×1 IMPLANT
BAG SPNG CNTER NS LX DISP (BAG) ×1
BNDG ELASTIC 4X5.8 VLCR STR LF (GAUZE/BANDAGES/DRESSINGS) ×1 IMPLANT
CANISTER SUCT 3000ML PPV (MISCELLANEOUS) ×1 IMPLANT
CLIP TI MEDIUM 24 (CLIP) ×1 IMPLANT
CLIP TI WIDE RED SMALL 24 (CLIP) ×1 IMPLANT
COVER PROBE W GEL 5X96 (DRAPES) ×1 IMPLANT
DERMABOND ADVANCED .7 DNX12 (GAUZE/BANDAGES/DRESSINGS) ×1 IMPLANT
ELECT REM PT RETURN 9FT ADLT (ELECTROSURGICAL) ×1
ELECTRODE REM PT RTRN 9FT ADLT (ELECTROSURGICAL) ×1 IMPLANT
GLOVE BIOGEL PI IND STRL 8 (GLOVE) ×1 IMPLANT
GOWN STRL REUS W/ TWL LRG LVL3 (GOWN DISPOSABLE) ×2 IMPLANT
GOWN STRL REUS W/TWL 2XL LVL3 (GOWN DISPOSABLE) ×2 IMPLANT
GOWN STRL REUS W/TWL LRG LVL3 (GOWN DISPOSABLE) ×2
HEMOSTAT SNOW SURGICEL 2X4 (HEMOSTASIS) IMPLANT
KIT BASIN OR (CUSTOM PROCEDURE TRAY) ×1 IMPLANT
KIT TURNOVER KIT B (KITS) ×1 IMPLANT
NDL HYPO 25GX1X1/2 BEV (NEEDLE) ×1 IMPLANT
NEEDLE HYPO 25GX1X1/2 BEV (NEEDLE) ×1 IMPLANT
NS IRRIG 1000ML POUR BTL (IV SOLUTION) ×1 IMPLANT
PACK CV ACCESS (CUSTOM PROCEDURE TRAY) ×1 IMPLANT
PAD ARMBOARD 7.5X6 YLW CONV (MISCELLANEOUS) ×2 IMPLANT
SLING ARM FOAM STRAP LRG (SOFTGOODS) IMPLANT
SLING ARM FOAM STRAP MED (SOFTGOODS) IMPLANT
SPIKE FLUID TRANSFER (MISCELLANEOUS) ×1 IMPLANT
SPONGE T-LAP 18X18 ~~LOC~~+RFID (SPONGE) IMPLANT
SUT MNCRL AB 4-0 PS2 18 (SUTURE) ×1 IMPLANT
SUT PROLENE 6 0 BV (SUTURE) ×1 IMPLANT
SUT PROLENE 7 0 BV 1 (SUTURE) IMPLANT
SUT SILK 2 0 SH (SUTURE) IMPLANT
SUT SILK 2 0 SH CR/8 (SUTURE) ×1 IMPLANT
SUT VIC AB 2-0 CT1 27 (SUTURE) ×1
SUT VIC AB 2-0 CT1 TAPERPNT 27 (SUTURE) ×1 IMPLANT
SUT VIC AB 3-0 SH 27 (SUTURE) ×2
SUT VIC AB 3-0 SH 27X BRD (SUTURE) ×2 IMPLANT
TOWEL GREEN STERILE (TOWEL DISPOSABLE) ×1 IMPLANT
UNDERPAD 30X36 HEAVY ABSORB (UNDERPADS AND DIAPERS) ×1 IMPLANT
WATER STERILE IRR 1000ML POUR (IV SOLUTION) ×1 IMPLANT

## 2023-04-14 NOTE — Progress Notes (Signed)
50 mcg fentanyl wasted in stericycle with Burna Forts, RN.

## 2023-04-14 NOTE — Anesthesia Postprocedure Evaluation (Signed)
Anesthesia Post Note  Patient: [name]  Procedure(s) Performed: LEFT ARM SECOND STAGE BASILIC VEIN TRANSPOSITION (Left: Arm Upper)     Patient location during evaluation: PACU Anesthesia Type: Regional Level of consciousness: awake and alert, patient cooperative and oriented Pain management: pain level controlled Vital Signs Assessment: post-procedure vital signs reviewed and stable Respiratory status: nonlabored ventilation, spontaneous breathing and respiratory function stable Cardiovascular status: blood pressure returned to baseline and stable Postop Assessment: no apparent nausea or vomiting and adequate PO intake Anesthetic complications: no   There were no known notable events for this encounter.  Last Vitals:  Vitals:   04/14/23 1340 04/14/23 1345  BP: (!) 149/81 113/89  Pulse: (!) 56 (!) 59  Resp: 16 12  Temp:  (!) 36.4 C  SpO2: 95% 96%    Last Pain:  Vitals:   04/14/23 1345  TempSrc:   PainSc: 0-No pain                 Abdishakur Gottschall,E. Niemah Schwebke

## 2023-04-14 NOTE — Op Note (Signed)
NAME: [name]    MRN: [id number] DOB: [birth year]    DATE OF OPERATION: 04/14/2023  PREOP DIAGNOSIS:    End stage renal disease  POSTOP DIAGNOSIS:    Same  PROCEDURE:    Left arm brachiobasilic transposition  SURGEON: Victorino Sparrow  ASSIST: Mosetta Pigeon, PA  ANESTHESIA: General   EBL: 30ml  INDICATIONS:    [name] is a 72 y.o. male with ESRD and prior history of brachiobasilic fistula creation. He presents today for transposition.   FINDINGS:    7mm brachiobasilic fistula with two large branches.   TECHNIQUE:   Patient was brought to the OR and laid in supine position.  Moderate anesthesia was induced. The patient was prepped and draped in standard fashion.  Lidocaine was brought to the field and a local block was performed.   The case began with left arm ultrasound fistula mapping.  Multiple branches were noted and marked.  Three longitudinal skip incisions were made along the course of the basilic vein with 5 cm bridges.  This was carried through the subcutaneous fat to the brachiobasilic fistula.  The fistula was mobilized and multiple branches ligated using 2-0 silk and clips.  Once mobilized, a gore tunneler was brought on to the field, and a subcutaneous tunnel was made along the medial aspect of the bicep. Next, the patient as heparinized, fistula marked, clamped and transected. The vein was pulled through the tunnel tract and reanastomosed using 6.0 prolene suture in running fashion in the axilla. The wound bed was irrigated with saline, hemostasis achieved with suture and cautery. The wounds were closed with layers of vicryl suture with monocryl and dermabond at the skin. There was a palpable thrill in the fistula at case completion with excellent pulse in the wrist.     Given the complexity of the case,  the assistant was necessary in order to expedient the procedure and safely perform the technical aspects of the operation.  The  assistant provided traction and countertraction to assist with exposure of the artery and vein.  They also assisted with suture ligation of multiple venous branches.  They played a critical role in the anastomosis. These skills, especially following the Prolene suture for the anastomosis, could not have been adequately performed by a scrub tech assistant.   Victorino Sparrow, MD [facility] DATE OF DICTATION:   04/14/2023

## 2023-04-14 NOTE — Transfer of Care (Signed)
Immediate Anesthesia Transfer of Care Note  Patient: [name]  Procedure(s) Performed: LEFT ARM SECOND STAGE BASILIC VEIN TRANSPOSITION (Left: Arm Upper)  Patient Location: PACU  Anesthesia Type:MAC and Regional  Level of Consciousness: drowsy and patient cooperative  Airway & Oxygen Therapy: Patient Spontanous Breathing and Patient connected to face mask oxygen  Post-op Assessment: Report given to RN and Post -op Vital signs reviewed and stable  Post vital signs: Reviewed and stable  Last Vitals:  Vitals Value Taken Time  BP 152/82 04/14/23 1325  Temp 36.4 C 04/14/23 1325  Pulse 60 04/14/23 1327  Resp 12 04/14/23 1327  SpO2 97 % 04/14/23 1327  Vitals shown include unfiled device data.  Last Pain:  Vitals:   04/14/23 0839  TempSrc:   PainSc: 0-No pain         Complications: No notable events documented.

## 2023-04-14 NOTE — H&P (Signed)
POST OPERATIVE OFFICE NOTE    Patient seen and examined in preop holding.  No complaints. No changes to medication history or physical exam since last seen in clinic. After discussing the risks and benefits of left arm brachiobasilic fistula transposition, [name] elected to proceed.   Victorino Sparrow MD   CC:  F/u for surgery  HPI:  This is a 72 y.o. male who is s/p first stage basilic fistula on 01/28/23 by Dr. Karin Lieu.  He is on HD via [facility].  Marland Kitchen    Pt returns today for follow up.  Pt states he has no pain, loss of sensation or loss of motor. His daughter is with him, he does not speak english, so she translated.    No Known Allergies  Current Facility-Administered Medications  Medication Dose Route Frequency Provider Last Rate Last Admin   0.9 %  sodium chloride infusion   Intravenous Continuous Victorino Sparrow, MD 10 mL/hr at 04/14/23 0901 New Bag at 04/14/23 0901   0.9 %  sodium chloride infusion   Intravenous Continuous Jairo Ben, MD       ceFAZolin (ANCEF) 2-4 GM/100ML-% IVPB            ceFAZolin (ANCEF) IVPB 2g/100 mL premix  2 g Intravenous 30 min Pre-Op Victorino Sparrow, MD       chlorhexidine (HIBICLENS) 4 % liquid 4 Application  60 mL Topical Once Victorino Sparrow, MD       And   [START ON 04/15/2023] chlorhexidine (HIBICLENS) 4 % liquid 4 Application  60 mL Topical Once Victorino Sparrow, MD       insulin aspart (novoLOG) injection 0-7 Units  0-7 Units Subcutaneous Q2H PRN Jairo Ben, MD       Facility-Administered Medications Ordered in Other Encounters  Medication Dose Route Frequency Provider Last Rate Last Admin   lidocaine-EPINEPHrine 1.5 %-1:200000 injection   Peri-NEURAL Anesthesia Intra-op Jairo Ben, MD   30 mL at 04/14/23 0919     ROS:  See HPI  Physical Exam:    Incision:  well healed with palpable arterial  anastomosis thrill Extremities:  N/M/V intact no symptoms of steal  Findings:   +--------------------+----------+-----------------+--------+  AVF                PSV (cm/s)Flow Vol (mL/min)Comments  +--------------------+----------+-----------------+--------+  Native artery inflow   180           885                 +--------------------+----------+-----------------+--------+  AVF Anastomosis        649                               +--------------------+----------+-----------------+--------+     +------------+----------+-------------+----------+----------------+  OUTFLOW VEINPSV (cm/s)Diameter (cm)Depth (cm)    Describe      +------------+----------+-------------+----------+----------------+  Prox UA         89        0.72        1.04                     +------------+----------+-------------+----------+----------------+  Mid UA         104        0.74        0.54                     +------------+----------+-------------+----------+----------------+  Dist UA  481        0.74        0.41   competing branch  +------------+----------+-------------+----------+----------------+  AC Fossa       576        0.48        0.46                     +------------+----------+-------------+----------+----------------+        Summary:  Patent arteriovenous fistula.     Assessment/Plan:  This is a 72 y.o. male who is s/p:first stage basilic fistula creation He has excellent results and the fistula is maturing well.  -He will be scheduled for second stage basilic transposition with DR. Ashland Wiseman.  He is on Eliquis  and has HD TTS.  He has a working right internal jugular TDC. The [facility] was placed by Dr. Arrie Aran.   Victorino Sparrow PA-C [facility] [phone]   Clinic MD:  Karin Lieu

## 2023-04-14 NOTE — Discharge Instructions (Signed)
[facility]  Discharge Instructions  AV Fistula or Graft Surgery for Dialysis Access  Please refer to the following instructions for your post-procedure care. Your surgeon or physician assistant will discuss any changes with you.  Activity  You may drive the day following your surgery, if you are comfortable and no longer taking prescription pain medication. Resume full activity as the soreness in your incision resolves.  Bathing/Showering  You may shower after you go home. Keep your incision dry for 48 hours. Do not soak in a bathtub, hot tub, or swim until the incision heals completely. You may not shower if you have a hemodialysis catheter.  Incision Care  Clean your incision with mild soap and water after 48 hours. Pat the area dry with a clean towel. You do not need a bandage unless otherwise instructed. Do not apply any ointments or creams to your incision. You may have skin glue on your incision. Do not peel it off. It will come off on its own in about one week. Your arm may swell a bit after surgery. To reduce swelling use pillows to elevate your arm so it is above your heart. Your doctor will tell you if you need to lightly wrap your arm with an ACE bandage.  Diet  Resume your normal diet. There are not special food restrictions following this procedure. In order to heal from your surgery, it is CRITICAL to get adequate nutrition. Your body requires vitamins, minerals, and protein. Vegetables are the best source of vitamins and minerals. Vegetables also provide the perfect balance of protein. Processed food has little nutritional value, so try to avoid this.  Medications  Resume taking all of your medications. If your incision is causing pain, you may take over-the counter pain relievers such as acetaminophen (Tylenol). If you were prescribed a stronger pain medication, please be aware these medications can cause nausea and constipation. Prevent  nausea by taking the medication with a snack or meal. Avoid constipation by drinking plenty of fluids and eating foods with high amount of fiber, such as fruits, vegetables, and grains. Do not take Tylenol if you are taking prescription pain medications.     Follow up Your surgeon may want to see you in the office following your access surgery. If so, this will be arranged at the time of your surgery.  Please call us immediately for any of the following conditions:  Increased pain, redness, drainage (pus) from your incision site Fever of 101 degrees or higher Severe or worsening pain at your incision site Hand pain or numbness.  Reduce your risk of vascular disease:  Stop smoking. If you would like help, call [facility] at 1-800-QUIT-NOW (815-030-5867) or [place] at [phone]  Manage your cholesterol Maintain a desired weight Control your diabetes Keep your blood pressure down  Dialysis  It will take several weeks to several months for your new dialysis access to be ready for use. Your surgeon will determine when it is OK to use it. Your nephrologist will continue to direct your dialysis. You can continue to use your Permcath until your new access is ready for use.  If you have any questions, please call the office at [phone].

## 2023-04-14 NOTE — Anesthesia Procedure Notes (Signed)
Anesthesia Regional Block: Interscalene brachial plexus block   Pre-Anesthetic Checklist: , timeout performed,  Correct Patient, Correct Site, Correct Laterality,  Correct Procedure, Correct Position, site marked,  Risks and benefits discussed,  Surgical consent,  Pre-op evaluation,  At surgeon's request and post-op pain management  Laterality: Left and Upper  Prep: chloraprep       Needles:  Injection technique: Single-shot  Needle Type: Echogenic Needle     Needle Length: 9cm  Needle Gauge: 21     Additional Needles:   Procedures:,,,, ultrasound used (permanent image in chart),,    Narrative:  Start time: 04/14/2023 9:12 AM End time: 04/14/2023 9:19 AM Injection made incrementally with aspirations every 5 mL.  Performed by: Personally  Anesthesiologist: Jairo Ben, MD  Additional Notes: Pt identified in Holding room.  Monitors applied. Working IV access confirmed. Timeout, Sterile prep L arm.  #21ga ECHOgenic Arrow block needle to interscalene brachial plexus with US guidance.  30cc 1.5% Lidocaine 1:200k epi injected incrementally after negative test dose.  Patient asymptomatic, VSS, no heme aspirated, tolerated well.   Sandford Craze, MD

## 2023-04-15 ENCOUNTER — Encounter (HOSPITAL_COMMUNITY): Payer: Self-pay | Admitting: Vascular Surgery

## 2023-05-01 ENCOUNTER — Telehealth: Payer: Self-pay

## 2023-05-01 NOTE — Telephone Encounter (Signed)
Spoke with Joy to schedule pt for tomorrow for HD u/s and to see PA. Per Joy, pt has abnormal bruitt/thrill and whistle. Joy will let his family know about this appt for tomorrow when he gets picked up from his treatment today.

## 2023-05-02 ENCOUNTER — Other Ambulatory Visit: Payer: Self-pay | Admitting: *Deleted

## 2023-05-02 ENCOUNTER — Ambulatory Visit (HOSPITAL_COMMUNITY)
Admission: RE | Admit: 2023-05-02 | Discharge: 2023-05-02 | Disposition: A | Discharge status: Home / Self Care | Payer: Self-pay | Source: Ambulatory Visit | Attending: Vascular Surgery | Admitting: Vascular Surgery

## 2023-05-02 ENCOUNTER — Ambulatory Visit (INDEPENDENT_AMBULATORY_CARE_PROVIDER_SITE_OTHER): Payer: Self-pay | Admitting: Physician Assistant

## 2023-05-02 DIAGNOSIS — N186 End stage renal disease: Secondary | ICD-10-CM

## 2023-05-02 NOTE — Progress Notes (Signed)
POST OPERATIVE OFFICE NOTE    CC:  F/u for surgery  HPI:  This is a 72 y.o. male who is s/p Left 2nd stage BVT on 04/14/2023 and 1st stage BVT on 01/28/2023 both by Dr. Karin Lieu.  He has TDC that was placed by IR on 03/03/2023.  Pt is here with his daughter who is translating for her father.    Pt states he does not have pain/numbness in the left hand.  He was scheduled for follow up next week, however the dialysis center heard a "whistling noise" and was sent sooner to be evaluated.    The pt is on dialysis T/T/S at [facility] location in [place].    No Known Allergies  Current Outpatient Medications  Medication Sig Dispense Refill   apixaban (ELIQUIS) 5 MG TABS tablet Take 1 tablet (5 mg total) by mouth 2 (two) times daily. 60 tablet 1   diltiazem (CARDIZEM CD) 180 MG 24 hr capsule Take 1 capsule (180 mg total) by mouth daily. 30 capsule 1   ferric citrate (AURYXIA) 1 GM 210 MG(Fe) tablet Take 210 mg by mouth 3 (three) times daily with meals.     glipiZIDE (GLUCOTROL) 10 MG tablet Take 10 mg by mouth daily as needed (If blood sugar is above 300).     glipiZIDE (GLUCOTROL) 5 MG tablet Take 1 tablet (5 mg total) by mouth daily before breakfast. 30 tablet 1   metoprolol succinate (TOPROL-XL) 50 MG 24 hr tablet Take 1 tablet (50 mg total) by mouth daily. Take with or immediately following a meal. 30 tablet 1   oxyCODONE-acetaminophen (PERCOCET/ROXICET) 5-325 MG tablet Take 1 tablet by mouth every 6 (six) hours as needed. 20 tablet 0   No current facility-administered medications for this visit.     ROS:  See HPI  Physical Exam:   Incision:  all incisions are healing nicely. Extremities:   There is a palpable left radial pulse.   Motor and sensory are in tact.   There is a thrill/bruit present.  Access is  easily palpable   Dialysis Duplex on 05/02/2023: +--------------------+----------+-----------------+--------+  AVF                PSV (cm/s)Flow Vol (mL/min)Comments   +--------------------+----------+-----------------+--------+  Native artery inflow   153          1059                 +--------------------+----------+-----------------+--------+  AVF Anastomosis        637                               +--------------------+----------+-----------------+--------+     +------------+----------+-------------+----------+--------+  OUTFLOW VEINPSV (cm/s)Diameter (cm)Depth (cm)Describe  +------------+----------+-------------+----------+--------+  Shoulder      110        0.81        0.34             +------------+----------+-------------+----------+--------+  Prox UA         97        0.76        0.19             +------------+----------+-------------+----------+--------+  Mid UA         156        0.70        0.25             +------------+----------+-------------+----------+--------+  Dist UA  497        0.48        0.75             +------------+----------+-------------+----------+--------+  AC Fossa       302        0.41        0.70             +------------+----------+-------------+----------+--------+   Summary:  Patent arteriovenous fistula. No evidence stenosis.     Assessment/Plan:  This is a 72 y.o. male who is s/p: Left 2nd stage BVT on 04/14/2023 and 1st stage BVT on 01/28/2023 both by Dr. Karin Lieu.   -the pt does not have evidence of steal.  Duplex today reveals patent fistula without stenosis and has matured nicely with good flow through the fistula. -pt's access can be used 05/20/2023 to give incisions time to completely heal. -pt does have TDC that was placed by IR in August 2024.  Once the fistula has been used to the satisfaction of the dialysis center, he can be scheduled to have TDC removed.    -discussed with pt that access does not last forever and will need intervention or even new access at some point.  -the pt will follow up as needed.  He did have his normal f/u appt scheduled for  next week-we will cancel that appt.    Doreatha Massed, [facility] [phone]  Clinic MD:  Hetty Blend

## 2023-05-20 ENCOUNTER — Emergency Department (HOSPITAL_COMMUNITY)
Admission: EM | Admit: 2023-05-20 | Discharge: 2023-05-20 | Disposition: A | Discharge status: Home / Self Care | Payer: Self-pay | Attending: Emergency Medicine | Admitting: Emergency Medicine

## 2023-05-20 ENCOUNTER — Emergency Department (HOSPITAL_COMMUNITY): Payer: Self-pay

## 2023-05-20 ENCOUNTER — Other Ambulatory Visit: Payer: Self-pay

## 2023-05-20 ENCOUNTER — Encounter (HOSPITAL_COMMUNITY): Payer: Self-pay | Admitting: *Deleted

## 2023-05-20 DIAGNOSIS — Z7984 Long term (current) use of oral hypoglycemic drugs: Secondary | ICD-10-CM | POA: Insufficient documentation

## 2023-05-20 DIAGNOSIS — Z7901 Long term (current) use of anticoagulants: Secondary | ICD-10-CM | POA: Insufficient documentation

## 2023-05-20 DIAGNOSIS — Z79899 Other long term (current) drug therapy: Secondary | ICD-10-CM | POA: Insufficient documentation

## 2023-05-20 DIAGNOSIS — Z992 Dependence on renal dialysis: Secondary | ICD-10-CM | POA: Insufficient documentation

## 2023-05-20 DIAGNOSIS — I4891 Unspecified atrial fibrillation: Secondary | ICD-10-CM | POA: Insufficient documentation

## 2023-05-20 LAB — COMPREHENSIVE METABOLIC PANEL
ALT: 13 U/L (ref 0–44)
AST: 18 U/L (ref 15–41)
Albumin: 3.6 g/dL (ref 3.5–5.0)
Alkaline Phosphatase: 90 U/L (ref 38–126)
Anion gap: 14 (ref 5–15)
BUN: 46 mg/dL — ABNORMAL HIGH (ref 8–23)
CO2: 24 mmol/L (ref 22–32)
Calcium: 7.2 mg/dL — ABNORMAL LOW (ref 8.9–10.3)
Chloride: 96 mmol/L — ABNORMAL LOW (ref 98–111)
Creatinine, Ser: 6.05 mg/dL — ABNORMAL HIGH (ref 0.61–1.24)
GFR, Estimated: 9 mL/min — ABNORMAL LOW (ref >60)
Glucose, Bld: 145 mg/dL — ABNORMAL HIGH (ref 70–99)
Potassium: 3.5 mmol/L (ref 3.5–5.1)
Sodium: 134 mmol/L — ABNORMAL LOW (ref 135–145)
Total Bilirubin: 0.5 mg/dL (ref 0.3–1.2)
Total Protein: 7.4 g/dL (ref 6.5–8.1)

## 2023-05-20 LAB — CBC WITH DIFFERENTIAL/PLATELET
Abs Immature Granulocytes: 0.03 10*3/uL (ref 0.00–0.07)
Basophils Absolute: 0 10*3/uL (ref 0.0–0.1)
Basophils Relative: 0 %
Eosinophils Absolute: 0.1 10*3/uL (ref 0.0–0.5)
Eosinophils Relative: 2 %
HCT: 33.4 % — ABNORMAL LOW (ref 39.0–52.0)
Hemoglobin: 11.1 g/dL — ABNORMAL LOW (ref 13.0–17.0)
Immature Granulocytes: 0 %
Lymphocytes Relative: 29 %
Lymphs Abs: 2.4 10*3/uL (ref 0.7–4.0)
MCH: 32.1 pg (ref 26.0–34.0)
MCHC: 33.2 g/dL (ref 30.0–36.0)
MCV: 96.5 fL (ref 80.0–100.0)
Monocytes Absolute: 0.6 10*3/uL (ref 0.1–1.0)
Monocytes Relative: 8 %
Neutro Abs: 5.1 10*3/uL (ref 1.7–7.7)
Neutrophils Relative %: 61 %
Platelets: 177 10*3/uL (ref 150–400)
RBC: 3.46 MIL/uL — ABNORMAL LOW (ref 4.22–5.81)
RDW: 12.9 % (ref 11.5–15.5)
WBC: 8.3 10*3/uL (ref 4.0–10.5)
nRBC: 0 % (ref 0.0–0.2)

## 2023-05-20 LAB — TROPONIN I (HIGH SENSITIVITY)
Troponin I (High Sensitivity): 14 ng/L (ref <18)
Troponin I (High Sensitivity): 12 ng/L (ref <18)

## 2023-05-20 MED ORDER — PANTOPRAZOLE SODIUM 40 MG IV SOLR
40.0000 mg | Freq: Once | INTRAVENOUS | Status: AC
  Administered 2023-05-20: 40 mg via INTRAVENOUS
  Filled 2023-05-20: qty 10

## 2023-05-20 MED ORDER — SODIUM CHLORIDE 0.9 % IV SOLN: Freq: Once | INTRAVENOUS | Status: AC

## 2023-05-20 MED ORDER — DILTIAZEM HCL-DEXTROSE 125-5 MG/125ML-% IV SOLN (PREMIX)
5.0000 mg/h | INTRAVENOUS | Status: DC
  Administered 2023-05-20: 5 mg/h via INTRAVENOUS
  Filled 2023-05-20: qty 125

## 2023-05-20 MED ORDER — METOPROLOL SUCCINATE ER 50 MG PO TB24
50.0000 mg | ORAL_TABLET | Freq: Every day | ORAL | Status: DC
  Administered 2023-05-20: 50 mg via ORAL
  Filled 2023-05-20: qty 1

## 2023-05-20 MED ORDER — DILTIAZEM LOAD VIA INFUSION
10.0000 mg | Freq: Once | INTRAVENOUS | Status: AC
  Administered 2023-05-20: 10 mg via INTRAVENOUS
  Filled 2023-05-20: qty 10

## 2023-05-20 MED ORDER — MIDODRINE HCL 10 MG PO TABS: ORAL_TABLET | ORAL | 0 refills | Status: AC

## 2023-05-20 MED ORDER — METOPROLOL TARTRATE 5 MG/5ML IV SOLN
5.0000 mg | Freq: Once | INTRAVENOUS | Status: AC
  Administered 2023-05-20: 5 mg via INTRAVENOUS
  Filled 2023-05-20: qty 5

## 2023-05-20 NOTE — ED Triage Notes (Signed)
Pt brought in by [facility] from [facility] in [place] with c/o heart rate going up to 140-150bpm during dialysis. Pt received about 50% of his dialysis today.

## 2023-05-20 NOTE — Discharge Instructions (Addendum)
See your nephrologist for recheck.  You may need to be given medications before your dialysis to keep your blood pressure and heart rate up.  You need to take your diltiazem and metoprolol.

## 2023-05-20 NOTE — ED Notes (Signed)
Pa  at bedside. 

## 2023-05-22 NOTE — ED Provider Notes (Signed)
[place] EMERGENCY DEPARTMENT AT [facility] Provider Note   CSN: [id number] Arrival date & time: 05/20/23  4098     History  Chief Complaint  Patient presents with   Tachycardia    [name] is a 72 y.o. male.  Patient brought to the emergency department EMS from [facility].  Patient reports he developed a rapid heart rate while receiving his dialysis.  Patient was able to get 2 hours of dialysis.  Patient reports he has a past medical history of atrial fibrillation.  Patient is on Eliquis diltiazem and metoprolol.  Patient reports he has been told to hold his morning dosage of metoprolol because his blood pressure and heart rate dropped when he receives dialysis.  Patient denies any fever or chills he has not had any cough or congestion.  Patient denies any chest pain he has not had any shortness of breath.  The history is provided by the patient and a relative. The history is limited by a language barrier. A language interpreter was used.       Home Medications Prior to Admission medications   Medication Sig Start Date End Date Taking? Authorizing Provider  apixaban (ELIQUIS) 5 MG TABS tablet Take 1 tablet (5 mg total) by mouth 2 (two) times daily. 02/03/23  Yes Noralee Stain, DO  diltiazem (CARDIZEM CD) 180 MG 24 hr capsule Take 1 capsule (180 mg total) by mouth daily. 02/03/23  Yes Noralee Stain, DO  glipiZIDE (GLUCOTROL) 5 MG tablet Take 1 tablet (5 mg total) by mouth daily before breakfast. Patient taking differently: Take 5 mg by mouth daily. 02/03/23  Yes Noralee Stain, DO  metoprolol succinate (TOPROL-XL) 25 MG 24 hr tablet Take 12.5 mg by mouth daily.   Yes [provider]  midodrine (PROAMATINE) 10 MG tablet Take one tablet before dialysis to help keep your blood pressure up 05/20/23  Yes Elson Areas, PA-C      Allergies    Patient has no known allergies.    Review of Systems   Review of Systems  Respiratory:   Negative for chest tightness and shortness of breath.   Cardiovascular:  Negative for chest pain.  All other systems reviewed and are negative.   Physical Exam Updated Vital Signs BP 111/66 (BP Location: Right Arm)   Pulse 61   Temp 98 F (36.7 C) (Oral)   Resp 16   Ht 5' 8.5" (1.74 m)   Wt 89 kg   SpO2 94%   BMI 29.40 kg/m  Physical Exam Vitals and nursing note reviewed.  Constitutional:      Appearance: He is well-developed.  HENT:     Head: Normocephalic.     Nose: Nose normal.     Mouth/Throat:     Mouth: Mucous membranes are moist.  Cardiovascular:     Rate and Rhythm: Tachycardia present.     Comments: A-fib varying between 110 and 130. Pulmonary:     Effort: Pulmonary effort is normal.  Abdominal:     General: Abdomen is flat. There is no distension.  Musculoskeletal:        General: Normal range of motion.     Cervical back: Normal range of motion.  Skin:    General: Skin is warm.  Neurological:     Mental Status: He is alert and oriented to person, place, and time.  Psychiatric:        Mood and Affect: Mood normal.     ED Results /  Procedures / Treatments   Labs (all labs ordered are listed, but only abnormal results are displayed) Labs Reviewed  CBC WITH DIFFERENTIAL/PLATELET - Abnormal; Notable for the following components:      Result Value   RBC 3.46 (*)    Hemoglobin 11.1 (*)    HCT 33.4 (*)    All other components within normal limits  COMPREHENSIVE METABOLIC PANEL - Abnormal; Notable for the following components:   Sodium 134 (*)    Chloride 96 (*)    Glucose, Bld 145 (*)    BUN 46 (*)    Creatinine, Ser 6.05 (*)    Calcium 7.2 (*)    GFR, Estimated 9 (*)    All other components within normal limits  TROPONIN I (HIGH SENSITIVITY)  TROPONIN I (HIGH SENSITIVITY)    EKG EKG Interpretation Date/Time:  Tuesday May 20 2023 13:18:51 EDT Ventricular Rate:  59 PR Interval:  221 QRS Duration:  121 QT Interval:  458 QTC  Calculation: 454 R Axis:   23  Text Interpretation: Sinus rhythm Prolonged PR interval Nonspecific intraventricular conduction delay Inferior infarct, old Confirmed by Tilden Fossa [phone]) on 05/21/2023 5:45:14 PM  Radiology No results found.  Procedures .Critical Care  Performed by: Elson Areas, PA-C Authorized by: Elson Areas, PA-C   Critical care provider statement:    Critical care time (minutes):  30   Critical care start time:  05/20/2023 10:00 AM   Critical care end time:  05/20/2023 2:00 PM   Critical care time was exclusive of:  Separately billable procedures and treating other patients and teaching time   Critical care was necessary to treat or prevent imminent or life-threatening deterioration of the following conditions:  Cardiac failure and circulatory failure   Critical care was time spent personally by me on the following activities:  Development of treatment plan with patient or surrogate, discussions with consultants, evaluation of patient's response to treatment, examination of patient, ordering and review of laboratory studies, ordering and review of radiographic studies, ordering and performing treatments and interventions, pulse oximetry, re-evaluation of patient's condition and review of old charts Comments:     Given his daily dosage of oral metoprolol.  Patient is started on a Cardizem drip with a 10 mg bolus and continuous infusion.  Dr. Estell Harpin evaluated patient and advised giving patient Lopressor.  Patient is given 5 mg of Lopressor IV.  Patient had decrease in his heart rate and was noted to be in normal sinus at a rate between 59 and 65.  Patient observed and has continued to be in normal sinus.  Patient's troponins are negative.  Patient reports feeling much better.  Patient is discharged in stable condition.     Medications Ordered in ED Medications  diltiazem (CARDIZEM) 1 mg/mL load via infusion 10 mg (10 mg Intravenous Bolus from Bag 05/20/23  1028)  0.9 %  sodium chloride infusion (0 mLs Intravenous Stopped 05/20/23 1600)  metoprolol tartrate (LOPRESSOR) injection 5 mg (5 mg Intravenous Given 05/20/23 1138)  pantoprazole (PROTONIX) injection 40 mg (40 mg Intravenous Given 05/20/23 1204)    ED Course/ Medical Decision Making/ A&P                                 Medical Decision Making Patient brought to the emergency department for rapid atrial fibrillation.  Patient was receiving dialysis when his heart rate increased.  Amount and/or Complexity of Data Reviewed  Independent Historian: spouse    Details: Patient is here with his wife and daughter who are supportive. Social history provided by EMS External Data Reviewed: notes.    Details: Cardiology notes reviewed Labs: ordered. Decision-making details documented in ED Course.    Details: labs ordered reviewed and interpreted patient has a normal potassium.  Glucose is elevated at 145.  Patient's hemoglobin is 11.1 Radiology: ordered and independent interpretation performed. Decision-making details documented in ED Course.    Details: X-ray shows no cardiopulmonary disease. ECG/medicine tests: ordered and independent interpretation performed. Decision-making details documented in ED Course.    Details: KG shows atrial fibrillation with RVR Discussion of management or test interpretation with external provider(s): I discussed the patient with cardiology.  They advised patient needs to take his medicines as directed by cardiology.  They advised nephrologist should be able to prevent patient from becoming hypotensive and cardiac without holding his medicines for rate control.  I discussed the patient with nephrology patient is followed by a nephrologist and [place] her nephrologist advised to give the patient a prescription for midodrine.  He can take a dose of midodrine before finishing dialysis tomorrow.  Risk Prescription drug management. Risk Details: Discussed with patient and  his family the importance of controlling his rate.  Daughter states she will speak to his nephrologist tomorrow to discuss medications that we will keep him from dropping his blood pressure so that he does not hold his rate control medications.  Patient and family understand.  They are advised to return to the emergency department if any problems           Final Clinical Impression(s) / ED Diagnoses Final diagnoses:  Atrial fibrillation with RVR (HCC)    Rx / DC Orders ED Discharge Orders          Ordered    midodrine (PROAMATINE) 10 MG tablet        05/20/23 1510           An After Visit Summary was printed and given to the patient.    Elson Areas, [place] 05/22/23 1304    Bethann Berkshire, MD 05/26/23 1046

## 2023-05-23 ENCOUNTER — Inpatient Hospital Stay (HOSPITAL_COMMUNITY)
Admission: EM | Admit: 2023-05-23 | Discharge: 2023-05-25 | DRG: 308 | Disposition: A | Discharge status: Home / Self Care | Payer: MEDICAID | Attending: Family Medicine | Admitting: Family Medicine

## 2023-05-23 ENCOUNTER — Emergency Department (HOSPITAL_COMMUNITY): Payer: MEDICAID

## 2023-05-23 ENCOUNTER — Other Ambulatory Visit: Payer: Self-pay

## 2023-05-23 ENCOUNTER — Encounter (HOSPITAL_COMMUNITY): Payer: Self-pay | Admitting: Emergency Medicine

## 2023-05-23 DIAGNOSIS — J9811 Atelectasis: Secondary | ICD-10-CM | POA: Diagnosis present

## 2023-05-23 DIAGNOSIS — I480 Paroxysmal atrial fibrillation: Principal | ICD-10-CM | POA: Diagnosis present

## 2023-05-23 DIAGNOSIS — Z992 Dependence on renal dialysis: Secondary | ICD-10-CM

## 2023-05-23 DIAGNOSIS — I120 Hypertensive chronic kidney disease with stage 5 chronic kidney disease or end stage renal disease: Secondary | ICD-10-CM | POA: Diagnosis present

## 2023-05-23 DIAGNOSIS — E785 Hyperlipidemia, unspecified: Secondary | ICD-10-CM | POA: Diagnosis present

## 2023-05-23 DIAGNOSIS — I4891 Unspecified atrial fibrillation: Principal | ICD-10-CM | POA: Diagnosis present

## 2023-05-23 DIAGNOSIS — R066 Hiccough: Secondary | ICD-10-CM | POA: Diagnosis present

## 2023-05-23 DIAGNOSIS — D631 Anemia in chronic kidney disease: Secondary | ICD-10-CM | POA: Diagnosis present

## 2023-05-23 DIAGNOSIS — N250 Renal osteodystrophy: Secondary | ICD-10-CM | POA: Diagnosis present

## 2023-05-23 DIAGNOSIS — I712 Thoracic aortic aneurysm, without rupture, unspecified: Secondary | ICD-10-CM | POA: Diagnosis present

## 2023-05-23 DIAGNOSIS — Z79899 Other long term (current) drug therapy: Secondary | ICD-10-CM

## 2023-05-23 DIAGNOSIS — K439 Ventral hernia without obstruction or gangrene: Secondary | ICD-10-CM | POA: Diagnosis present

## 2023-05-23 DIAGNOSIS — Z8673 Personal history of transient ischemic attack (TIA), and cerebral infarction without residual deficits: Secondary | ICD-10-CM

## 2023-05-23 DIAGNOSIS — E1122 Type 2 diabetes mellitus with diabetic chronic kidney disease: Secondary | ICD-10-CM | POA: Diagnosis present

## 2023-05-23 DIAGNOSIS — Z7984 Long term (current) use of oral hypoglycemic drugs: Secondary | ICD-10-CM

## 2023-05-23 DIAGNOSIS — E1129 Type 2 diabetes mellitus with other diabetic kidney complication: Secondary | ICD-10-CM

## 2023-05-23 DIAGNOSIS — I77810 Thoracic aortic ectasia: Secondary | ICD-10-CM

## 2023-05-23 DIAGNOSIS — E119 Type 2 diabetes mellitus without complications: Secondary | ICD-10-CM

## 2023-05-23 DIAGNOSIS — N186 End stage renal disease: Secondary | ICD-10-CM | POA: Diagnosis present

## 2023-05-23 DIAGNOSIS — Z7901 Long term (current) use of anticoagulants: Secondary | ICD-10-CM

## 2023-05-23 DIAGNOSIS — I10 Essential (primary) hypertension: Secondary | ICD-10-CM | POA: Diagnosis present

## 2023-05-23 HISTORY — DX: End stage renal disease: N18.6

## 2023-05-23 HISTORY — DX: End stage renal disease: Z99.2

## 2023-05-23 LAB — HEPATITIS B SURFACE ANTIGEN: Hepatitis B Surface Ag: NONREACTIVE

## 2023-05-23 LAB — CBC WITH DIFFERENTIAL/PLATELET
Abs Immature Granulocytes: 0.04 10*3/uL (ref 0.00–0.07)
Basophils Absolute: 0.1 10*3/uL (ref 0.0–0.1)
Basophils Relative: 1 %
Eosinophils Absolute: 0.1 10*3/uL (ref 0.0–0.5)
Eosinophils Relative: 1 %
HCT: 34.6 % — ABNORMAL LOW (ref 39.0–52.0)
Hemoglobin: 11.3 g/dL — ABNORMAL LOW (ref 13.0–17.0)
Immature Granulocytes: 1 %
Lymphocytes Relative: 34 %
Lymphs Abs: 2.7 10*3/uL (ref 0.7–4.0)
MCH: 31.8 pg (ref 26.0–34.0)
MCHC: 32.7 g/dL (ref 30.0–36.0)
MCV: 97.5 fL (ref 80.0–100.0)
Monocytes Absolute: 0.8 10*3/uL (ref 0.1–1.0)
Monocytes Relative: 10 %
Neutro Abs: 4.4 10*3/uL (ref 1.7–7.7)
Neutrophils Relative %: 53 %
Platelets: 208 10*3/uL (ref 150–400)
RBC: 3.55 MIL/uL — ABNORMAL LOW (ref 4.22–5.81)
RDW: 13.1 % (ref 11.5–15.5)
WBC: 8.1 10*3/uL (ref 4.0–10.5)
nRBC: 0 % (ref 0.0–0.2)

## 2023-05-23 LAB — COMPREHENSIVE METABOLIC PANEL
ALT: 14 U/L (ref 0–44)
AST: 15 U/L (ref 15–41)
Albumin: 3.5 g/dL (ref 3.5–5.0)
Alkaline Phosphatase: 92 U/L (ref 38–126)
Anion gap: 12 (ref 5–15)
BUN: 46 mg/dL — ABNORMAL HIGH (ref 8–23)
CO2: 26 mmol/L (ref 22–32)
Calcium: 6.8 mg/dL — ABNORMAL LOW (ref 8.9–10.3)
Chloride: 102 mmol/L (ref 98–111)
Creatinine, Ser: 7.37 mg/dL — ABNORMAL HIGH (ref 0.61–1.24)
GFR, Estimated: 7 mL/min — ABNORMAL LOW (ref >60)
Glucose, Bld: 136 mg/dL — ABNORMAL HIGH (ref 70–99)
Potassium: 3.9 mmol/L (ref 3.5–5.1)
Sodium: 140 mmol/L (ref 135–145)
Total Bilirubin: 0.6 mg/dL (ref 0.3–1.2)
Total Protein: 7.1 g/dL (ref 6.5–8.1)

## 2023-05-23 LAB — PHOSPHORUS: Phosphorus: 4.4 mg/dL (ref 2.5–4.6)

## 2023-05-23 LAB — MAGNESIUM: Magnesium: 1.9 mg/dL (ref 1.7–2.4)

## 2023-05-23 LAB — GLUCOSE, CAPILLARY: Glucose-Capillary: 164 mg/dL — ABNORMAL HIGH (ref 70–99)

## 2023-05-23 MED ORDER — INSULIN ASPART 100 UNIT/ML IJ SOLN
0.0000 [IU] | Freq: Every day | INTRAMUSCULAR | Status: DC
  Administered 2023-05-24: 3 [IU] via SUBCUTANEOUS

## 2023-05-23 MED ORDER — GLIPIZIDE 5 MG PO TABS: 5.0000 mg | ORAL_TABLET | Freq: Every day | ORAL | Status: DC

## 2023-05-23 MED ORDER — DILTIAZEM HCL-DEXTROSE 125-5 MG/125ML-% IV SOLN (PREMIX)
5.0000 mg/h | INTRAVENOUS | Status: DC
Start: 2023-05-23 — End: 2023-05-23
  Administered 2023-05-23: 5 mg/h via INTRAVENOUS
  Filled 2023-05-23: qty 125

## 2023-05-23 MED ORDER — DICYCLOMINE HCL 20 MG PO TABS: 20.0000 mg | ORAL_TABLET | ORAL | Status: DC | PRN

## 2023-05-23 MED ORDER — APIXABAN 5 MG PO TABS
5.0000 mg | ORAL_TABLET | Freq: Two times a day (BID) | ORAL | Status: DC
Start: 2023-05-24 — End: 2023-05-25
  Administered 2023-05-24 – 2023-05-25 (×3): 5 mg via ORAL
  Filled 2023-05-23 (×3): qty 1

## 2023-05-23 MED ORDER — AMIODARONE HCL IN DEXTROSE 360-4.14 MG/200ML-% IV SOLN
30.0000 mg/h | INTRAVENOUS | Status: DC
  Administered 2023-05-24 – 2023-05-25 (×3): 30 mg/h via INTRAVENOUS
  Filled 2023-05-23 (×3): qty 200

## 2023-05-23 MED ORDER — AMIODARONE LOAD VIA INFUSION
150.0000 mg | Freq: Once | INTRAVENOUS | Status: AC
  Administered 2023-05-23: 150 mg via INTRAVENOUS
  Filled 2023-05-23: qty 83.34

## 2023-05-23 MED ORDER — METOCLOPRAMIDE HCL 5 MG/ML IJ SOLN
10.0000 mg | Freq: Once | INTRAMUSCULAR | Status: AC
  Administered 2023-05-23: 10 mg via INTRAVENOUS
  Filled 2023-05-23: qty 2

## 2023-05-23 MED ORDER — CHLORPROMAZINE HCL 10 MG PO TABS: 10.0000 mg | ORAL_TABLET | Freq: Three times a day (TID) | ORAL | Status: DC | PRN

## 2023-05-23 MED ORDER — GLIPIZIDE 5 MG PO TABS
2.5000 mg | ORAL_TABLET | Freq: Every day | ORAL | Status: DC
  Administered 2023-05-25: 2.5 mg via ORAL
  Filled 2023-05-23: qty 0.5
  Filled 2023-05-23 (×2): qty 1
  Filled 2023-05-23: qty 0.5

## 2023-05-23 MED ORDER — ONDANSETRON HCL 4 MG PO TABS
4.0000 mg | ORAL_TABLET | Freq: Three times a day (TID) | ORAL | Status: DC | PRN
  Administered 2023-05-24: 4 mg via ORAL
  Filled 2023-05-23: qty 1

## 2023-05-23 MED ORDER — APIXABAN 5 MG PO TABS
5.0000 mg | ORAL_TABLET | Freq: Two times a day (BID) | ORAL | Status: DC
  Administered 2023-05-23: 5 mg via ORAL
  Filled 2023-05-23: qty 1

## 2023-05-23 MED ORDER — ORAL CARE MOUTH RINSE: 15.0000 mL | OROMUCOSAL | Status: DC | PRN

## 2023-05-23 MED ORDER — METOPROLOL TARTRATE 5 MG/5ML IV SOLN
5.0000 mg | Freq: Once | INTRAVENOUS | Status: AC
  Administered 2023-05-23: 5 mg via INTRAVENOUS
  Filled 2023-05-23: qty 5

## 2023-05-23 MED ORDER — CHLORHEXIDINE GLUCONATE CLOTH 2 % EX PADS: 6.0000 | MEDICATED_PAD | Freq: Every day | CUTANEOUS | Status: DC

## 2023-05-23 MED ORDER — AMIODARONE HCL IN DEXTROSE 360-4.14 MG/200ML-% IV SOLN
60.0000 mg/h | INTRAVENOUS | Status: DC
  Administered 2023-05-23 (×2): 60 mg/h via INTRAVENOUS
  Filled 2023-05-23 (×2): qty 200

## 2023-05-23 MED ORDER — INSULIN ASPART 100 UNIT/ML IJ SOLN: 0.0000 [IU] | Freq: Three times a day (TID) | INTRAMUSCULAR | Status: DC

## 2023-05-23 MED ORDER — LORAZEPAM 2 MG/ML IJ SOLN
0.5000 mg | Freq: Once | INTRAMUSCULAR | Status: AC
  Administered 2023-05-23: 0.5 mg via INTRAVENOUS
  Filled 2023-05-23: qty 1

## 2023-05-23 MED ORDER — CHLORPROMAZINE HCL 10 MG PO TABS
10.0000 mg | ORAL_TABLET | Freq: Three times a day (TID) | ORAL | Status: DC | PRN
  Administered 2023-05-23 – 2023-05-24 (×3): 10 mg via ORAL
  Filled 2023-05-23 (×5): qty 1

## 2023-05-23 NOTE — ED Notes (Signed)
HR 138  BP 112/86

## 2023-05-23 NOTE — ED Triage Notes (Signed)
Pt here from home with c/op possible Afib rvr , per family heart rate has been from 120's to 130s , c/o sob and weakness

## 2023-05-23 NOTE — Plan of Care (Signed)

## 2023-05-23 NOTE — ED Notes (Signed)
ED TO INPATIENT HANDOFF REPORT  ED Nurse Name and Phone #: Vernona Rieger [phone]  S Name/Age/Gender [name] 72 y.o. male Room/Bed: 038C/038C  Code Status   Code Status: Full Code  Home/SNF/Other Home Patient oriented to: self, place, time, and situation Is this baseline? Yes   Triage Complete: Triage complete  Chief Complaint Atrial fibrillation with tachycardic ventricular rate (HCC) [I48.91]  Triage Note Pt here from home with c/op possible Afib rvr , per family heart rate has been from 120's to 130s , c/o sob and weakness   Allergies No Known Allergies  Level of Care/Admitting Diagnosis ED Disposition     ED Disposition  Admit   Condition  --   Comment  Hospital Area: [facility] [[id number]]  Level of Care: Telemetry Cardiac [103]  May place patient in observation at [facility] or [facility] if equivalent level of care is available:: Yes  Covid Evaluation: Asymptomatic - no recent exposure (last 10 days) testing not required  Diagnosis: Atrial fibrillation with tachycardic ventricular rate [facility]) [[id number]]  Admitting Physician: Alessandra Bevels [[id number]]  Attending Physician: Alessandra Bevels [[id number]]          B Medical/Surgery History Past Medical History:  Diagnosis Date   Diabetes mellitus    ESRD on hemodialysis (HCC)    HLD (hyperlipidemia)    Hypertension    Past Surgical History:  Procedure Laterality Date   APPENDECTOMY     ruptured appendix 13 years ago did not close wound   AV FISTULA PLACEMENT Left 01/28/2023   Procedure: LEFT ARM BRACHIO BASILIC ARTERIOVENOUS (AV) FISTULA CREATION;  Surgeon: Victorino Sparrow, MD;  Location: [facility] OR;  Service: Vascular;  Laterality: Left;   BASCILIC VEIN TRANSPOSITION Left 04/14/2023   Procedure: LEFT ARM SECOND STAGE BASILIC VEIN TRANSPOSITION;  Surgeon: Victorino Sparrow, MD;  Location: [facility] OR;  Service: Vascular;  Laterality: Left;  with [facility]   HERNIA REPAIR     11 yrs ago     IR FLUORO GUIDE CV LINE RIGHT  01/24/2023   IR FLUORO GUIDE CV LINE RIGHT  03/03/2023   IR US GUIDE VASC ACCESS RIGHT  01/24/2023   OPEN REDUCTION INTERNAL FIXATION (ORIF) DISTAL RADIAL FRACTURE Left 09/03/2019   Procedure: OPEN REDUCTION INTERNAL FIXATION (ORIF) DISTAL RADIAL FRACTURE;  Surgeon: Mack Hook, MD;  Location: [facility];  Service: Orthopedics;  Laterality: Left;     A IV Location/Drains/Wounds Patient Lines/Drains/Airways Status     Active Line/Drains/Airways     Name Placement date Placement time Site Days   Peripheral IV 05/23/23 20 G 1" Anterior;Right Forearm 05/23/23  1146  Forearm  less than 1   Fistula / Graft Left Upper arm Arteriovenous fistula 01/28/23  1048  Upper arm  115   Hemodialysis Catheter Right Subclavian Double lumen Permanent (Tunneled) 03/03/23  1524  Subclavian  81   Wound / Incision (Open or Dehisced) 01/22/23  Arm Left;Lower;Posterior 01/22/23  0325  Arm  121            Intake/Output Last 24 hours  Intake/Output Summary (Last 24 hours) at 05/23/2023 1650 Last data filed at 05/23/2023 1640 Gross per 24 hour  Intake 5.98 ml  Output --  Net 5.98 ml    Labs/Imaging Results for orders placed or performed during the hospital encounter of 05/23/23 (from the past 48 hour(s))  CBC with Differential/Platelet     Status: Abnormal   Collection Time: 05/23/23 11:38 AM  Result Value Ref Range  WBC 8.1 4.0 - 10.5 K/uL   RBC 3.55 (L) 4.22 - 5.81 MIL/uL   Hemoglobin 11.3 (L) 13.0 - 17.0 g/dL   HCT 16.1 (L) 09.6 - 04.5 %   MCV 97.5 80.0 - 100.0 fL   MCH 31.8 26.0 - 34.0 pg   MCHC 32.7 30.0 - 36.0 g/dL   RDW 40.9 81.1 - 91.4 %   Platelets 208 150 - 400 K/uL   nRBC 0.0 0.0 - 0.2 %   Neutrophils Relative % 53 %   Neutro Abs 4.4 1.7 - 7.7 K/uL   Lymphocytes Relative 34 %   Lymphs Abs 2.7 0.7 - 4.0 K/uL   Monocytes Relative 10 %   Monocytes Absolute 0.8 0.1 - 1.0 K/uL   Eosinophils Relative 1 %   Eosinophils Absolute 0.1 0.0 - 0.5  K/uL   Basophils Relative 1 %   Basophils Absolute 0.1 0.0 - 0.1 K/uL   Immature Granulocytes 1 %   Abs Immature Granulocytes 0.04 0.00 - 0.07 K/uL    Comment: Performed at [facility], [address] [place], [place] [address]  Comprehensive metabolic panel     Status: Abnormal   Collection Time: 05/23/23 11:38 AM  Result Value Ref Range   Sodium 140 135 - 145 mmol/L   Potassium 3.9 3.5 - 5.1 mmol/L   Chloride 102 98 - 111 mmol/L   CO2 26 22 - 32 mmol/L   Glucose, Bld 136 (H) 70 - 99 mg/dL    Comment: Glucose reference range applies only to samples taken after fasting for at least 8 hours.   BUN 46 (H) 8 - 23 mg/dL   Creatinine, Ser 6.21 (H) 0.61 - 1.24 mg/dL   Calcium 6.8 (L) 8.9 - 10.3 mg/dL   Total Protein 7.1 6.5 - 8.1 g/dL   Albumin 3.5 3.5 - 5.0 g/dL   AST 15 15 - 41 U/L   ALT 14 0 - 44 U/L   Alkaline Phosphatase 92 38 - 126 U/L   Total Bilirubin 0.6 0.3 - 1.2 mg/dL   GFR, Estimated 7 (L) >60 mL/min    Comment: (NOTE) Calculated using the CKD-EPI Creatinine Equation (2021)    Anion gap 12 5 - 15    Comment: Performed at [facility], [address], [place], [place] [address]  Magnesium     Status: None   Collection Time: 05/23/23 11:38 AM  Result Value Ref Range   Magnesium 1.9 1.7 - 2.4 mg/dL    Comment: Performed at [facility], [address] [place], [place] [address]   DG Chest Port 1 View  Result Date: 05/23/2023 CLINICAL DATA:  Palpitations and shortness of breath EXAM: PORTABLE CHEST 1 VIEW COMPARISON:  05/20/2023. FINDINGS: Double-lumen right IJ catheter in place. Tip of the SVC right atrial junction. Stable cardiopericardial silhouette. Underinflation. Basilar atelectasis. No pneumothorax or edema. No consolidation. The left inferior costophrenic angle is clipped off the edge of the film. IMPRESSION: Right IJ catheter. Underinflation with some basilar atelectasis. Electronically Signed   By: Karen Kays M.D.   On: 05/23/2023 13:25     Pending Labs Unresulted Labs (From admission, onward)     Start     Ordered   05/23/23 1647  Phosphorus  Add-on,   AD        05/23/23 1647   05/23/23 1647  Hepatitis B surface antigen  (New Admission Hemo Labs (Hepatitis B))  Once,   R  05/23/23 1647   05/23/23 1647  Hepatitis B surface antibody,quantitative  (New Admission Hemo Labs (Hepatitis B))  Once,   R        05/23/23 1647   05/23/23 1522  Calcium, ionized  Once,   STAT        05/23/23 1521            Vitals/Pain Today's Vitals   05/23/23 1600 05/23/23 1615 05/23/23 1630 05/23/23 1645  BP: 121/89 124/83 124/88 124/78  Pulse: (!) 101 (!) 136 (!) 108 (!) 115  Resp: 18 (!) 27 (!) 21 16  Temp:      TempSrc:      SpO2: 100% 98% 100% 100%  PainSc:        Isolation Precautions No active isolations  Medications Medications  diltiazem (CARDIZEM) 125 mg in dextrose 5% 125 mL (1 mg/mL) infusion (10 mg/hr Intravenous Infusion Verify 05/23/23 1640)  Chlorhexidine Gluconate Cloth 2 % PADS 6 each (has no administration in time range)  metoprolol tartrate (LOPRESSOR) injection 5 mg (5 mg Intravenous Given 05/23/23 1151)  metoCLOPramide (REGLAN) injection 10 mg (10 mg Intravenous Given 05/23/23 1158)  LORazepam (ATIVAN) injection 0.5 mg (0.5 mg Intravenous Given 05/23/23 1332)  metoprolol tartrate (LOPRESSOR) injection 5 mg (5 mg Intravenous Given 05/23/23 1402)    Mobility walks     Focused Assessments Cardiac Assessment Handoff:  Cardiac Rhythm: Atrial fibrillation Lab Results  Component Value Date   CKTOTAL 75 09/24/2010   CKMB 2.5 09/24/2010   TROPONINI 0.02        NO INDICATION OF MYOCARDIAL INJURY. 09/24/2010   Lab Results  Component Value Date   DDIMER (H) 09/05/2010    >20.00        AT THE INHOUSE ESTABLISHED CUTOFF VALUE OF 0.48 ug/mL FEU, THIS ASSAY HAS BEEN DOCUMENTED IN THE LITERATURE TO HAVE A SENSITIVITY AND NEGATIVE PREDICTIVE VALUE OF AT LEAST 98 TO 99%.  THE TEST RESULT SHOULD BE  CORRELATED WITH AN ASSESSMENT OF THE CLINICAL PROBABILITY OF DVT / VTE.   Does the Patient currently have chest pain? No    R Recommendations: See Admitting Provider Note  Report given to:   Additional Notes: Hiccups since Sunday

## 2023-05-23 NOTE — H&P (Addendum)
History and Physical    DOA: 05/23/2023  PCP: Hattie Perch, FNP  Patient coming from: home  Chief Complaint: palpitations  HPI: [name] is a 72 y.o. Spanish-speaking male with history h/o hypertension, hyperlipidemia, DM, paroxysmal A-fib, CVA, thoracic aortic aneurysm, ESRD, recently initiated on HD TTS brought in by daughter and concern for recurrent episode of palpitations/A-fib with RVR.  Per daughter, patient usually takes both metoprolol and Cardizem for atrial fibrillation rate control.  Since initiated on dialysis patient has had episodes of palpitations that have been mostly occurring on the night of his dialysis session.  He has had ED visits and hospitalizations for this.  Past weekend, patient had episode of nausea and diarrhea for which she was seen at [facility] in [place] and told to have a stomach virus.  Patient did recover within a day in terms of diarrhea but has had poor eating (only about 25% per daughter).  Past Tuesday patient underwent his scheduled HD but had to be aborted after 2 hours due to A-fib with RVR and hypotension and sent to [facility].  Patient was given metoprolol additional dose and he apparently converted to sinus rhythm.  At that time he was advised to take metoprolol prior to dialysis in order to avoid postdialysis nocturnal palpitations.  He was also prescribed midodrine for blood pressure tolerance to be taken along with beta-blocker.  Subsequently, he underwent dialysis on Thursday and did take metoprolol/midodrine prior to the session.  However later in the night (last night), experienced palpitations and presented to the ED again.  Patient otherwise denies any chest pain, dizziness or nausea or vomiting or diarrhea.  He currently is hungry and eating a sandwich during the interview.  He denies any leg swellings.  Patient does complain of hiccups that he has been having for a week now--noted during history physical as well.. ED course:  Afebrile, HR 115-130 on the monitor when I was in the room, BP 124/78, respiratory rate 16, O2 sat 100% on room air.  Labs show WBC 8.1, hemoglobin 11.3, hematocrit 34.6, platelet 208, sodium 140, potassium 3.9, chloride 102, bicarb 26, BUN 46, creatinine 7.37, calcium 6.8, glucose 136, magnesium 1.9.  On arrival patient's heart rate was 140s and received IV metoprolol 5 mg after which he converted to normal sinus rhythm.  He however went back to A-fib with RVR with heart rate in 120s when cardiology was consulted and they recommended to admit to hospitalist service, and need for antiarrhythmics at this point.    Review of Systems: As per HPI, otherwise review of systems negative.    Past Medical History:  Diagnosis Date   Diabetes mellitus    ESRD on hemodialysis (HCC)    HLD (hyperlipidemia)    Hypertension     Past Surgical History:  Procedure Laterality Date   APPENDECTOMY     ruptured appendix 13 years ago did not close wound   AV FISTULA PLACEMENT Left 01/28/2023   Procedure: LEFT ARM BRACHIO BASILIC ARTERIOVENOUS (AV) FISTULA CREATION;  Surgeon: Victorino Sparrow, MD;  Location: [facility] OR;  Service: Vascular;  Laterality: Left;   BASCILIC VEIN TRANSPOSITION Left 04/14/2023   Procedure: LEFT ARM SECOND STAGE BASILIC VEIN TRANSPOSITION;  Surgeon: Victorino Sparrow, MD;  Location: [facility] OR;  Service: Vascular;  Laterality: Left;  with Regional Block   HERNIA REPAIR     11 yrs ago    IR FLUORO GUIDE CV LINE RIGHT  01/24/2023   IR FLUORO GUIDE  CV LINE RIGHT  03/03/2023   IR US GUIDE VASC ACCESS RIGHT  01/24/2023   OPEN REDUCTION INTERNAL FIXATION (ORIF) DISTAL RADIAL FRACTURE Left 09/03/2019   Procedure: OPEN REDUCTION INTERNAL FIXATION (ORIF) DISTAL RADIAL FRACTURE;  Surgeon: Mack Hook, MD;  Location: [facility];  Service: Orthopedics;  Laterality: Left;    Social history:  reports that he has never smoked. He has never used smokeless tobacco. He reports that he does not drink  alcohol and does not use drugs.   No Known Allergies  Family History  Problem Relation Age of Onset   Heart attack Neg Hx       Prior to Admission medications   Medication Sig Start Date End Date Taking? Authorizing Provider  apixaban (ELIQUIS) 5 MG TABS tablet Take 1 tablet (5 mg total) by mouth 2 (two) times daily. 02/03/23  Yes Noralee Stain, DO  dicyclomine (BENTYL) 20 MG tablet Take 20 mg by mouth every 4 (four) hours as needed (Abdominal cramping/pain). 05/18/23 06/17/23 Yes [provider]  diltiazem (CARDIZEM CD) 180 MG 24 hr capsule Take 1 capsule (180 mg total) by mouth daily. 02/03/23  Yes Noralee Stain, DO  glipiZIDE (GLUCOTROL) 5 MG tablet Take 1 tablet (5 mg total) by mouth daily before breakfast. 02/03/23  Yes Noralee Stain, DO  metoprolol succinate (TOPROL-XL) 25 MG 24 hr tablet Take 12.5 mg by mouth daily.   Yes [provider]  midodrine (PROAMATINE) 10 MG tablet Take one tablet before dialysis to help keep your blood pressure up 05/20/23  Yes Cheron Schaumann K, PA-C  ondansetron (ZOFRAN-ODT) 4 MG disintegrating tablet Take 4 mg by mouth every 8 (eight) hours as needed for nausea or vomiting. 05/18/23 05/25/23 Yes [provider]    Physical Exam: Vitals:   05/23/23 1615 05/23/23 1630 05/23/23 1645 05/23/23 1700  BP: 124/83 124/88 124/78 123/69  Pulse: (!) 136 (!) 108 (!) 115 (!) 102  Resp: (!) 27 (!) 21 16 16   Temp:      TempSrc:      SpO2: 98% 100% 100% 98%    Constitutional: NAD, calm, comfortable Eyes: PERRL, lids and conjunctivae normal ENMT: Mucous membranes are moist. Posterior pharynx clear of any exudate or lesions.Normal dentition.  Neck: normal, supple, no masses, no thyromegaly Respiratory: clear to auscultation bilaterally, no wheezing, no crackles. Normal respiratory effort. No accessory muscle use.  Cardiovascular: irregular rate and rhythm, no murmurs / rubs / gallops. No extremity edema. 2+ pedal pulses. No carotid  bruits.  Abdomen: no tenderness, no masses palpated. No hepatosplenomegaly. Bowel sounds positive.  Musculoskeletal: no clubbing / cyanosis. No joint deformity upper and lower extremities. Good ROM, no contractures. Normal muscle tone.  Neurologic: CN 2-12 grossly intact. Sensation intact, moving all extremities equally. Psychiatric: Normal judgment and insight. Alert and oriented x 3. Normal mood.  SKIN/catheters: no rashes, lesions, ulcers. No induration.  LUA AVF+bruit / RIJ TDC   Labs on Admission: I have personally reviewed following labs and imaging studies  CBC: Recent Labs  Lab 05/20/23 0945 05/23/23 1138  WBC 8.3 8.1  NEUTROABS 5.1 4.4  HGB 11.1* 11.3*  HCT 33.4* 34.6*  MCV 96.5 97.5  PLT 177 208   Basic Metabolic Panel: Recent Labs  Lab 05/20/23 0945 05/23/23 1138  NA 134* 140  K 3.5 3.9  CL 96* 102  CO2 24 26  GLUCOSE 145* 136*  BUN 46* 46*  CREATININE 6.05* 7.37*  CALCIUM 7.2* 6.8*  MG  --  1.9  GFR: Estimated Creatinine Clearance: 9.6 mL/min (A) (by C-G formula based on SCr of 7.37 mg/dL (H)). Recent Labs  Lab 05/20/23 0945 05/23/23 1138  WBC 8.3 8.1   Liver Function Tests: Recent Labs  Lab 05/20/23 0945 05/23/23 1138  AST 18 15  ALT 13 14  ALKPHOS 90 92  BILITOT 0.5 0.6  PROT 7.4 7.1  ALBUMIN 3.6 3.5   No results for input(s): "LIPASE", "AMYLASE" in the last 168 hours. No results for input(s): "AMMONIA" in the last 168 hours. Coagulation Profile: No results for input(s): "INR", "PROTIME" in the last 168 hours. Cardiac Enzymes: No results for input(s): "CKTOTAL", "CKMB", "CKMBINDEX", "TROPONINI" in the last 168 hours. BNP (last 3 results) No results for input(s): "PROBNP" in the last 8760 hours. HbA1C: No results for input(s): "HGBA1C" in the last 72 hours. CBG: No results for input(s): "GLUCAP" in the last 168 hours. Lipid Profile: No results for input(s): "CHOL", "HDL", "LDLCALC", "TRIG", "CHOLHDL", "LDLDIRECT" in the last 72  hours. Thyroid Function Tests: No results for input(s): "TSH", "T4TOTAL", "FREET4", "T3FREE", "THYROIDAB" in the last 72 hours. Anemia Panel: No results for input(s): "VITAMINB12", "FOLATE", "FERRITIN", "TIBC", "IRON", "RETICCTPCT" in the last 72 hours. Urine analysis:    Component Value Date/Time   COLORURINE STRAW (A) 01/22/2023 0856   APPEARANCEUR CLEAR 01/22/2023 0856   LABSPEC 1.008 01/22/2023 0856   PHURINE 5.0 01/22/2023 0856   GLUCOSEU NEGATIVE 01/22/2023 0856   HGBUR SMALL (A) 01/22/2023 0856   BILIRUBINUR NEGATIVE 01/22/2023 0856   KETONESUR NEGATIVE 01/22/2023 0856   PROTEINUR >=300 (A) 01/22/2023 0856   UROBILINOGEN 0.2 10/22/2010 1000   NITRITE NEGATIVE 01/22/2023 0856   LEUKOCYTESUR NEGATIVE 01/22/2023 0856    Radiological Exams on Admission: Personally reviewed  DG Chest Port 1 View  Result Date: 05/23/2023 CLINICAL DATA:  Palpitations and shortness of breath EXAM: PORTABLE CHEST 1 VIEW COMPARISON:  05/20/2023. FINDINGS: Double-lumen right IJ catheter in place. Tip of the SVC right atrial junction. Stable cardiopericardial silhouette. Underinflation. Basilar atelectasis. No pneumothorax or edema. No consolidation. The left inferior costophrenic angle is clipped off the edge of the film. IMPRESSION: Right IJ catheter. Underinflation with some basilar atelectasis. Electronically Signed   By: Karen Kays M.D.   On: 05/23/2023 13:25    EKG: Independently reviewed.  Patient has had 2 EKGs in the ED course-initially showing A-fib with RVR, repeat showing normal sinus rhythm with QTc 461 ms     Assessment and Plan:   Principal Problem:   Atrial fibrillation with tachycardic ventricular rate (HCC) Active Problems:   Ventral hernia   T2DM (type 2 diabetes mellitus) (HCC)   Hypertension   H/O: CVA (cerebrovascular accident)   ESRD (end stage renal disease) (HCC)   Hyperlipidemia   Hiccoughs    1.  Recurrent atrial fibrillation with RVR: Appreciate cardiology  recommendations.  Noted patient initiated on diltiazem drip as well as amiodarone drip.  Upgraded to progressive bed.  Patient was taking Cardizem/metoprolol at home as well as metoprolol as needed for tachycardia.  Given difficult titration, rhythm control measures initiated.  Resume Eliquis.  Last echo showed EF 60% and stage I diastolic dysfunction.  2.  Intractable hiccups: LFTs appear to be within normal limits.  BUN somewhat elevated but not significant enough to explain causing hiccups.  Chest x-ray showed bibasilar atelectasis but no pneumonia.  Will obtain liver ultrasound to rule out subdiaphragmatic process.  Thorazine 10 mg as needed ordered for hiccups, can increase to 25 mg if needed.  3.  History of CVA: In 12/2022 showing left caudate nucleus and corona radiata infarct.  Resume home medications including Eliquis.  4.  Diabetes mellitus: Resume glipizide at lower dose while in the hospital and monitor blood glucose levels with sliding scale coverage.  Can resume home dose of glipizide if blood glucose elevated.  5.  ESRD: Appreciate nephrology evaluation, noted HD orders.  Next dialysis session is on Saturday.  Electrolytes acceptable except low calcium level noted.  Will obtain ionized calcium level.  Hold off on midodrine given problem #1 and defer further titration to nephrology based on BP parameters.  6. Hyperlipidemia: Not on any antihyperlipidemics per home med list.  Lipid profile in AM.  DVT prophylaxis: On Eliquis  Code Status: Full code   .Health care proxy would be his daughters-Elizabeth/[place]  Patient/Family Communication: Discussed with patient and daughter ([place]) at bedside and all questions answered to satisfaction.  Consults called: Cardiology, nephrology Admission status :Patient will be admitted under OBSERVATION status.The patient's presenting symptoms, physical exam findings, and initial radiographic and laboratory data in the context of their medical  condition is felt to place them at low risk for further clinical deterioration. Furthermore, it is anticipated that the patient will be medically stable for discharge from the hospital within 2 midnights of hospital stay.       Alessandra Bevels MD [facility] Pager in [place]  If 7PM-7AM, please contact night-coverage [email or web address]   05/23/2023, 6:54 PM

## 2023-05-23 NOTE — ED Provider Notes (Signed)
[place] EMERGENCY DEPARTMENT AT [facility] Provider Note   CSN: [id number] Arrival date & time: 05/23/23  1124     History  Chief Complaint  Patient presents with   Irregular Heart Beat    [name] is a 72 y.o. male.  Pt is a 72y/o male with hx of HTN, DM2, paroxysmal A-fib on Eliquis, CKD stage IV started dialysis tu/thrs/sat about 3-4 months ago, CVA who is presenting today with complaints of palpitations, shortness of breath and racing heart.  Patient's daughter does majority of the history because patient is a non-English speaker and he reports his daughter takes care of him and knows most of the history.  He reports that last week he had a bout of gastroenteritis with significant diarrhea and abdominal pain which is improved but since he is gone back to dialysis he is having more frequent episodes of atrial fibrillation.  On Tuesday when he was at dialysis he was only able to get 2 hours of treatment because his heart rate went to 150 requiring him to go to the emergency room.  While at the emergency room he was treated with Cardizem and metoprolol and he converted back to a sinus rhythm.  He has been continued to take Cardizem, metoprolol and Eliquis as prescribed but again today told his daughter that he was having shortness of breath and they checked his heart rate and knows up to 160 at home.  He denies any chest pain but still complains of feeling a little bit winded.  He denies any abdominal pain at this time there is been no vomiting and he has had no further episodes of diarrhea.  Last full course of dialysis was yesterday.  His daughter reports she tried to contact the specialist and his PCP but nobody will call them back.  They do not have a cardiologist.  Patient did take his medication prior to dialysis yesterday.  He does have midodrine to use as needed for drops in blood pressure while at dialysis.  His daughter also reports for the last week he has had  persistent hiccups that will not go away.  He even hiccups when he is asleep.  The history is provided by the patient, the EMS personnel and a relative.       Home Medications Prior to Admission medications   Medication Sig Start Date End Date Taking? Authorizing Provider  apixaban (ELIQUIS) 5 MG TABS tablet Take 1 tablet (5 mg total) by mouth 2 (two) times daily. 02/03/23   Noralee Stain, DO  diltiazem (CARDIZEM CD) 180 MG 24 hr capsule Take 1 capsule (180 mg total) by mouth daily. 02/03/23   Noralee Stain, DO  glipiZIDE (GLUCOTROL) 5 MG tablet Take 1 tablet (5 mg total) by mouth daily before breakfast. Patient taking differently: Take 5 mg by mouth daily. 02/03/23   Noralee Stain, DO  metoprolol succinate (TOPROL-XL) 25 MG 24 hr tablet Take 12.5 mg by mouth daily.    [provider]  midodrine (PROAMATINE) 10 MG tablet Take one tablet before dialysis to help keep your blood pressure up 05/20/23   Elson Areas, PA-C      Allergies    Patient has no known allergies.    Review of Systems   Review of Systems  Physical Exam Updated Vital Signs BP 122/84   Pulse 71   Temp 97.9 F (36.6 C)   Resp 16   SpO2 100%  Physical Exam Vitals and nursing note reviewed.  Constitutional:      General: He is not in acute distress.    Appearance: He is well-developed.  HENT:     Head: Normocephalic and atraumatic.  Eyes:     Conjunctiva/sclera: Conjunctivae normal.     Pupils: Pupils are equal, round, and reactive to light.  Cardiovascular:     Rate and Rhythm: Tachycardia present. Rhythm irregularly irregular.     Heart sounds: Murmur heard.     Systolic murmur is present with a grade of 1/6.  Pulmonary:     Effort: Pulmonary effort is normal. No respiratory distress.     Breath sounds: Normal breath sounds. No wheezing or rales.     Comments: Tunneling catheter noted in the chest Abdominal:     General: There is no distension.     Palpations: Abdomen is soft.      Tenderness: There is no abdominal tenderness. There is no guarding or rebound.     Comments: Multiple well-healed surgical scars in the abdomen but no focal abdominal pain  Musculoskeletal:        General: No tenderness. Normal range of motion.     Cervical back: Normal range of motion and neck supple.     Right lower leg: No edema.     Left lower leg: No edema.     Comments: Graft with thrill noted in the left upper extremity.  Skin:    General: Skin is warm and dry.     Findings: No erythema or rash.  Neurological:     Mental Status: He is alert and oriented to person, place, and time. Mental status is at baseline.  Psychiatric:        Mood and Affect: Mood normal.        Behavior: Behavior normal.     ED Results / Procedures / Treatments   Labs (all labs ordered are listed, but only abnormal results are displayed) Labs Reviewed  CBC WITH DIFFERENTIAL/PLATELET - Abnormal; Notable for the following components:      Result Value   RBC 3.55 (*)    Hemoglobin 11.3 (*)    HCT 34.6 (*)    All other components within normal limits  COMPREHENSIVE METABOLIC PANEL - Abnormal; Notable for the following components:   Glucose, Bld 136 (*)    BUN 46 (*)    Creatinine, Ser 7.37 (*)    Calcium 6.8 (*)    GFR, Estimated 7 (*)    All other components within normal limits  MAGNESIUM    EKG EKG Interpretation Date/Time:  Friday May 23 2023 12:48:54 EDT Ventricular Rate:  68 PR Interval:  221 QRS Duration:  103 QT Interval:  433 QTC Calculation: 461 R Axis:   27  Text Interpretation: Sinus rhythm Prolonged PR interval Consider left atrial enlargement Low voltage, precordial leads Atrial fibrillation RESOLVED SINCE PREVIOUS Confirmed by Gwyneth Sprout ([id number]) on 05/23/2023 1:00:53 PM  Radiology DG Chest Port 1 View  Result Date: 05/23/2023 CLINICAL DATA:  Palpitations and shortness of breath EXAM: PORTABLE CHEST 1 VIEW COMPARISON:  05/20/2023. FINDINGS: Double-lumen right IJ  catheter in place. Tip of the SVC right atrial junction. Stable cardiopericardial silhouette. Underinflation. Basilar atelectasis. No pneumothorax or edema. No consolidation. The left inferior costophrenic angle is clipped off the edge of the film. IMPRESSION: Right IJ catheter. Underinflation with some basilar atelectasis. Electronically Signed   By: Karen Kays M.D.   On: 05/23/2023 13:25    Procedures Procedures    Medications Ordered in ED Medications  diltiazem (CARDIZEM) 125 mg in dextrose 5% 125 mL (1 mg/mL) infusion (has no administration in time range)  metoprolol tartrate (LOPRESSOR) injection 5 mg (5 mg Intravenous Given 05/23/23 1151)  metoCLOPramide (REGLAN) injection 10 mg (10 mg Intravenous Given 05/23/23 1158)  LORazepam (ATIVAN) injection 0.5 mg (0.5 mg Intravenous Given 05/23/23 1332)  metoprolol tartrate (LOPRESSOR) injection 5 mg (5 mg Intravenous Given 05/23/23 1402)    ED Course/ Medical Decision Making/ A&P                                 Medical Decision Making Amount and/or Complexity of Data Reviewed Independent Historian:     Details: Daughter External Data Reviewed: notes. Labs: ordered. Decision-making details documented in ED Course. Radiology: ordered and independent interpretation performed. Decision-making details documented in ED Course. ECG/medicine tests: ordered and independent interpretation performed. Decision-making details documented in ED Course.  Risk Prescription drug management.   Pt with multiple medical problems and comorbidities and presenting today with a complaint that caries a high risk for morbidity and mortality.  Here today with recurrent atrial fibrillation with RVR.  Patient is currently on Cardizem and metoprolol and was having palpitations and shortness of breath today with elevated heart rate.  His daughter reports that dialysis yesterday they said his heart rate was normal he was not having symptoms at that time.  Suspect he is  gone into A-fib sometime between yesterday and now.  He also has ongoing hiccups.  Low suspicion for infectious etiology at this time.  Had blood work done when he was seen on Tuesday that showed no significant electrolyte abnormalities despite having a bunch of diarrhea over the weekend.  He has no abdominal pain at this time that would suggest an acute abdominal process.  Will treat with metoprolol and will try Reglan for the hiccups.  Currently hemodynamically stable.  I independently interpreted patient's EKG which shows A-fib RVR similar to prior.  2:53 PM I independently interpreted patient's labs and CBC stable with stable hemoglobin of 11 and normal white count, CMP consistent with end-stage renal disease but no other acute findings, magnesium is normal.  I have independently visualized and interpreted pt's images today.  Chest x-ray without acute findings today.  After 5 mg of IV metoprolol patient has converted to a sinus rhythm.  Heart rates now in the 70s.  Repeat EKG shows sinus rhythm.  Patient was on continuous cardiac monitoring which I independently interpreted as sinus rhythm.  Speaking with patient and his daughter he is still having hiccups and intermittently feels very anxious feels like he cannot catch his breath however sats are 100% even now as he is feeling that way.  He is anticoagulated and low suspicion for PE.  No globular heart or cardiomegaly on chest x-ray and low suspicion for pericardial effusion.  Discussing with the daughter we will consult with cardiology if there is any medication changes that need to happen.  He will need follow-up with the A-fib clinic.  Patient given Ativan to see if that will help with his hiccups and his anxiety which he is eager to try.  2:53 PM After ativan pt is now relaxed, hiccups have ceased but now pt back in afib.  Will given another dose of lopressor.   2:53 PM No change in rhythm and pt still in afib but hiccups have resolved.  Spoke with  Dr. Anne Fu and pt most likely needs transition  to a antiarrhythmic.  Will start cardizem gtt for now but will admit for further intervention.  Discussed with pt and his daughter who are agreeable to this plan.  CRITICAL CARE Performed by: Harjas Biggins Total critical care time: 30 minutes Critical care time was exclusive of separately billable procedures and treating other patients. Critical care was necessary to treat or prevent imminent or life-threatening deterioration. Critical care was time spent personally by me on the following activities: development of treatment plan with patient and/or surrogate as well as nursing, discussions with consultants, evaluation of patient's response to treatment, examination of patient, obtaining history from patient or surrogate, ordering and performing treatments and interventions, ordering and review of laboratory studies, ordering and review of radiographic studies, pulse oximetry and re-evaluation of patient's condition.          Final Clinical Impression(s) / ED Diagnoses Final diagnoses:  Atrial fibrillation with RVR [facility])    Rx / DC Orders ED Discharge Orders     None         Gwyneth Sprout, MD 05/23/23 1601

## 2023-05-23 NOTE — Consult Note (Signed)
Renal Service Consult Note [facility]  Deryl Dwan Hemmelgarn 05/23/2023 Maree Krabbe, MD Requesting Physician: Dr. Anitra Lauth, Lacretia Nicks.   Reason for Consult: ESRD pt w/ afib / RVR HPI: The patient is a 72 y.o. year-old w/ PMH as below who presented to ED w/ c/o SOB and weakness. Found to be in atrial fib w/ RVR w/ HR's in the 130s. Pt to be admitted. Pt is ESRD on HD TTS. We are asked to see for dialysis.   Pt seen in ED room.  No c/o's, daughter is interpreting. She says they used his RUA AVF for the 1st time his last dialysis with one needle. He also has a TDC in the R chest.  He started HD this summer. Pt has no c/o's at this time. Had full HD yesterday.   ROS - denies CP, no joint pain, no HA, no blurry vision, no rash, no diarrhea, no nausea/ vomiting   Past Medical History  Past Medical History:  Diagnosis Date   Diabetes mellitus    HLD (hyperlipidemia)    Hypertension    Renal failure 01/27/2023   Past Surgical History  Past Surgical History:  Procedure Laterality Date   APPENDECTOMY     ruptured appendix 13 years ago did not close wound   AV FISTULA PLACEMENT Left 01/28/2023   Procedure: LEFT ARM BRACHIO BASILIC ARTERIOVENOUS (AV) FISTULA CREATION;  Surgeon: Victorino Sparrow, MD;  Location: [facility] OR;  Service: Vascular;  Laterality: Left;   BASCILIC VEIN TRANSPOSITION Left 04/14/2023   Procedure: LEFT ARM SECOND STAGE BASILIC VEIN TRANSPOSITION;  Surgeon: Victorino Sparrow, MD;  Location: [facility] OR;  Service: Vascular;  Laterality: Left;  with Regional Block   HERNIA REPAIR     11 yrs ago    IR FLUORO GUIDE CV LINE RIGHT  01/24/2023   IR FLUORO GUIDE CV LINE RIGHT  03/03/2023   IR US GUIDE VASC ACCESS RIGHT  01/24/2023   OPEN REDUCTION INTERNAL FIXATION (ORIF) DISTAL RADIAL FRACTURE Left 09/03/2019   Procedure: OPEN REDUCTION INTERNAL FIXATION (ORIF) DISTAL RADIAL FRACTURE;  Surgeon: Mack Hook, MD;  Location: [facility];  Service: Orthopedics;   Laterality: Left;   Family History  Family History  Problem Relation Age of Onset   Heart attack Neg Hx    Social History  reports that he has never smoked. He has never used smokeless tobacco. He reports that he does not drink alcohol and does not use drugs. Allergies No Known Allergies Home medications Prior to Admission medications   Medication Sig Start Date End Date Taking? Authorizing Provider  apixaban (ELIQUIS) 5 MG TABS tablet Take 1 tablet (5 mg total) by mouth 2 (two) times daily. 02/03/23  Yes Noralee Stain, DO  diltiazem (CARDIZEM CD) 180 MG 24 hr capsule Take 1 capsule (180 mg total) by mouth daily. 02/03/23  Yes Noralee Stain, DO  glipiZIDE (GLUCOTROL) 5 MG tablet Take 1 tablet (5 mg total) by mouth daily before breakfast. 02/03/23  Yes Noralee Stain, DO  metoprolol succinate (TOPROL-XL) 25 MG 24 hr tablet Take 12.5 mg by mouth daily.   Yes [provider]  midodrine (PROAMATINE) 10 MG tablet Take one tablet before dialysis to help keep your blood pressure up 05/20/23  Yes Elson Areas, PA-C  dicyclomine (BENTYL) 20 MG tablet Take 20 mg by mouth every 4 (four) hours as needed (Abdominal cramping/pain). 05/18/23 06/17/23  [provider]  ondansetron (ZOFRAN-ODT) 4 MG disintegrating tablet Take 4 mg by mouth  every 8 (eight) hours as needed for nausea or vomiting. 05/18/23 05/25/23  [provider]     Vitals:   05/23/23 1537 05/23/23 1540 05/23/23 1600 05/23/23 1615  BP:   121/89 124/83  Pulse:  (!) 111 (!) 101 (!) 136  Resp:  15 18 (!) 27  Temp: 98.4 F (36.9 C)     TempSrc: Oral     SpO2:  100% 100% 98%   Exam Gen alert, no distress No rash, cyanosis or gangrene Sclera anicteric, throat clear  No jvd or bruits Chest clear bilat to bases, no rales/ wheezing RRR no MRG Abd soft ntnd no mass or ascites +bs GU normal male MS no joint effusions or deformity Ext no LE or UE edema, no wounds or ulcers Neuro is alert, Ox 3 , nf    LUA  AVF+bruit / RIJ TDC      Renal-related home meds: - eliuqis 5 bid - cardizem cd 180 every day - metoprolol xl 12.5 every day - midodrine 10mg  pre hd tts    OP HD: TTS HD (not sure where, ? [facility])  - get records in am   Assessment/ Plan: Atrial fib w/ RVR - per pmd/ cardiology [facility] - on HD TTS. Last HD was yesterday. Plan HD tomorrow.  HTN/ BP - BP's wnl today. Gets midodrine pre HD 10mg . Cont home meds.  Volume - no sig vol excess on exam. Plan UF goal 2 L w/ HD tomorrow.  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 Will add on phos.      Vinson Moselle  MD [facility] 05/23/2023, 4:37 PM  Recent Labs  Lab 05/20/23 0945 05/23/23 1138  HGB 11.1* 11.3*  ALBUMIN 3.6 3.5  CALCIUM 7.2* 6.8*  CREATININE 6.05* 7.37*  K 3.5 3.9   Inpatient medications:   diltiazem (CARDIZEM) infusion 10 mg/hr (05/23/23 1618)

## 2023-05-23 NOTE — Discharge Instructions (Signed)
Information on my medicine - ELIQUIS (apixaban)  This medication education was reviewed with me or my healthcare representative as part of my discharge preparation.     Why was Eliquis prescribed for you? Eliquis was prescribed for you to reduce the risk of a blood clot forming that can cause a stroke if you have a medical condition called atrial fibrillation (a type of irregular heartbeat).  What do You need to know about Eliquis ? Take your Eliquis TWICE DAILY - one tablet in the morning and one tablet in the evening with or without food. If you have difficulty swallowing the tablet whole please discuss with your pharmacist how to take the medication safely.  Take Eliquis exactly as prescribed by your doctor and DO NOT stop taking Eliquis without talking to the doctor who prescribed the medication.  Stopping may increase your risk of developing a stroke.  Refill your prescription before you run out.  After discharge, you should have regular check-up appointments with your healthcare provider that is prescribing your Eliquis.  In the future your dose may need to be changed if your kidney function or weight changes by a significant amount or as you get older.  What do you do if you miss a dose? If you miss a dose, take it as soon as you remember on the same day and resume taking twice daily.  Do not take more than one dose of ELIQUIS at the same time to make up a missed dose.  Important Safety Information A possible side effect of Eliquis is bleeding. You should call your healthcare provider right away if you experience any of the following: Bleeding from an injury or your nose that does not stop. Unusual colored urine (red or dark brown) or unusual colored stools (red or black). Unusual bruising for unknown reasons. A serious fall or if you hit your head (even if there is no bleeding).  Some medicines may interact with Eliquis and might increase your risk of bleeding or clotting  while on Eliquis. To help avoid this, consult your healthcare provider or pharmacist prior to using any new prescription or non-prescription medications, including herbals, vitamins, non-steroidal anti-inflammatory drugs (NSAIDs) and supplements.  This website has more information on Eliquis (apixaban): http://www.eliquis.com/eliquis/home =======================================  Atrial Fibrillation    Atrial fibrillation is a type of heartbeat that is irregular or fast. If you have this condition, your heart beats without any order. This makes it hard for your heart to pump blood in a normal way. Atrial fibrillation may come and go, or it may become a long-lasting problem. If this condition is not treated, it can put you at higher risk for stroke, heart failure, and other heart problems.  What are the causes? This condition may be caused by diseases that damage the heart. They include: High blood pressure. Heart failure. Heart valve disease. Heart surgery. Other causes include: Diabetes. Thyroid disease. Being overweight. Kidney disease. Sometimes the cause is not known.  What increases the risk? You are more likely to develop this condition if: You are older. You smoke. You exercise often and very hard. You have a family history of this condition. You are a man. You use drugs. You drink a lot of alcohol. You have lung conditions, such as emphysema, pneumonia, or COPD. You have sleep apnea.  What are the signs or symptoms? Common symptoms of this condition include: A feeling that your heart is beating very fast. Chest pain or discomfort. Feeling short of breath. Suddenly feeling   light-headed or weak. Getting tired easily during activity. Fainting. Sweating. In some cases, there are no symptoms.  How is this treated? Treatment for this condition depends on underlying conditions and how you feel when you have atrial fibrillation. They include: Medicines to: Prevent  blood clots. Treat heart rate or heart rhythm problems. Using devices, such as a pacemaker, to correct heart rhythm problems. Doing surgery to remove the part of the heart that sends bad signals. Closing an area where clots can form in the heart (left atrial appendage). In some cases, your doctor will treat other underlying conditions.  Follow these instructions at home:  Medicines Take over-the-counter and prescription medicines only as told by your doctor. Do not take any new medicines without first talking to your doctor. If you are taking blood thinners: Talk with your doctor before you take any medicines that have aspirin or NSAIDs, such as ibuprofen, in them. Take your medicine exactly as told by your doctor. Take it at the same time each day. Avoid activities that could hurt or bruise you. Follow instructions about how to prevent falls. Wear a bracelet that says you are taking blood thinners. Or, carry a card that lists what medicines you take. Lifestyle         Do not use any products that have nicotine or tobacco in them. These include cigarettes, e-cigarettes, and chewing tobacco. If you need help quitting, ask your doctor. Eat heart-healthy foods. Talk with your doctor about the right eating plan for you. Exercise regularly as told by your doctor. Do not drink alcohol. Lose weight if you are overweight. Do not use drugs, including cannabis.  General instructions If you have a condition that causes breathing to stop for a short period of time (apnea), treat it as told by your doctor. Keep a healthy weight. Do not use diet pills unless your doctor says they are safe for you. Diet pills may make heart problems worse. Keep all follow-up visits as told by your doctor. This is important.  Contact a doctor if: You notice a change in the speed, rhythm, or strength of your heartbeat. You are taking a blood-thinning medicine and you get more bruising. You get tired more easily  when you move or exercise. You have a sudden change in weight.  Get help right away if:    You have pain in your chest or your belly (abdomen). You have trouble breathing. You have side effects of blood thinners, such as blood in your vomit, poop (stool), or pee (urine), or bleeding that cannot stop. You have any signs of a stroke. "BE FAST" is an easy way to remember the main warning signs: B - Balance. Signs are dizziness, sudden trouble walking, or loss of balance. E - Eyes. Signs are trouble seeing or a change in how you see. F - Face. Signs are sudden weakness or loss of feeling in the face, or the face or eyelid drooping on one side. A - Arms. Signs are weakness or loss of feeling in an arm. This happens suddenly and usually on one side of the body. S - Speech. Signs are sudden trouble speaking, slurred speech, or trouble understanding what people say. T - Time. Time to call emergency services. Write down what time symptoms started. You have other signs of a stroke, such as: A sudden, very bad headache with no known cause. Feeling like you may vomit (nausea). Vomiting. A seizure.  These symptoms may be an emergency. Do not wait to   see if the symptoms will go away. Get medical help right away. Call your local emergency services (911 in the [place] Do not drive yourself to the hospital. Summary Atrial fibrillation is a type of heartbeat that is irregular or fast. You are at higher risk of this condition if you smoke, are older, have diabetes, or are overweight. Follow your doctor's instructions about medicines, diet, exercise, and follow-up visits. Get help right away if you have signs or symptoms of a stroke. Get help right away if you cannot catch your breath, or you have chest pain or discomfort. This information is not intended to replace advice given to you by your health care provider. Make sure you discuss any questions you have with your health care provider. Document  Revised: 12/30/2018 Document Reviewed: 12/30/2018 [employer] Patient Education  2020 [employer]    

## 2023-05-23 NOTE — Consult Note (Addendum)
Cardiology Consultation   Patient ID: Merit Maybee MRN: [id number]; DOB: 05/30/1951  Admit date: 05/23/2023 Date of Consult: 05/23/2023  PCP:  Hattie Perch, FNP   [facility] Providers Cardiologist: Dr. Sharol Given of [facility] in [place]   Patient Profile:   Maxtyn Nuzum is a 72 y.o. male with a hx of PAF, HTN, HLD, DM II, ESRD on HD TThSat, CVA 6/24 and thoracic aortic aneurysm who is being seen 05/23/2023 for the evaluation of recurrent atrial fibrillation at the request of Dr. Anitra Lauth.  History of Present Illness:   Mr. Damel Querry is a 72 year old male was past medical history of PAF, HTN, HLD, DM II, ESRD on HD TThSat, CVA 6/24 and thoracic aortic aneurysm.  Patient underwent DCCV at [facility] in May 2022.  He was last seen by Dr. Iran Planas on 08/07/2021 at which time he was on metoprolol succinate, benazepril and Eliquis.  It was mentioned in his office note that the previous creatinine was 1.2 in March 2021.  He was sent to [facility] ED on 10/31/2022 due to worsening renal function and hyperkalemia.  Blood work at the time showed creatinine elevated at 3.4, BUN 52, potassium 5.9.  His benazepril was discontinued and he was discharged on amlodipine, Lipitor and 12.5 mg daily of metoprolol succinate.  For some reason, Eliquis was no longer on his discharge paperwork at this point.  He was admitted at [facility] in June 2024 with acute CVA.  MRI consistent with acute CVA of the left caudate nucleus and corona radiata.  During the hospitalization, renal function worsened further.  Creatinine went up to 5.26.  Nephrology service was involved.  He also had atrial fibrillation with RVR.  Metoprolol succinate increased to 25 mg twice daily.  He was restarted on Eliquis at a low dose of 2.5 mg twice a day.  Patient was readmitted in July 2024 due to worsening renal function.  Temporary dialysis catheter was placed on 7/5 and he started his  first dialysis on 7/6.  He has since been on Tuesday Thursday and Saturday dialysis schedule.  He was seeing by vascular surgery and underwent fistula creation on 7/9.  During the hospitalization, patient had recurrent atrial fibrillation and cardiology service was consulted.  Eliquis was increased to 5 mg twice a day.  He was started on IV diltiazem and later transition to Cardizem CD 180 mg daily on top of 50 mg daily of metoprolol succinate.  Echocardiogram obtained during the hospitalization showed EF 55 to 60%, grade 1 DD, mild AI, dilated aortic root at 48 mm.  He later underwent left arm brachial basilic transposition by Dr. Karin Lieu of vascular surgery on 04/14/2023.  In the past week, he had multiple recurrence of atrial fibrillation.  His dialysis on Tuesday was cut short as he went into atrial fibrillation 2 hours into the dialysis. He held his metoprolol and diltiazem in the morning of the dialysis. He was sent to [facility]. Blood pressure was also dipped during dialysis, therefore he was given PRN midodrine to take on dialysis days.  He had recurrence of atrial fibrillation on Wednesday and again on Thursday night.  This morning around 6 AM, he had recurrence of atrial fibrillation, and this prompted the patient to seek urgent medical attention at [facility], ED.  On initial arrival, he was in atrial fibrillation with RVR.  Creatinine was 7.37.  He did complete a full dialysis session yesterday.  Hemoglobin 11.3.  Chest x-ray normal.  He was initially in atrial fibrillation, around 12:15 PM, he converted back to sinus rhythm after rate control agent was given.  Around 1:30 PM, he had recurrence of atrial fibrillation and is currently in A-fib with a heart rate in the 80s to 110 range.  Cardiology service consulted for recurrent atrial fibrillation.   Past Medical History:  Diagnosis Date   Diabetes mellitus    HLD (hyperlipidemia)    Hypertension    Renal failure 01/27/2023    Past  Surgical History:  Procedure Laterality Date   APPENDECTOMY     ruptured appendix 13 years ago did not close wound   AV FISTULA PLACEMENT Left 01/28/2023   Procedure: LEFT ARM BRACHIO BASILIC ARTERIOVENOUS (AV) FISTULA CREATION;  Surgeon: Victorino Sparrow, MD;  Location: [facility] OR;  Service: Vascular;  Laterality: Left;   BASCILIC VEIN TRANSPOSITION Left 04/14/2023   Procedure: LEFT ARM SECOND STAGE BASILIC VEIN TRANSPOSITION;  Surgeon: Victorino Sparrow, MD;  Location: [facility] OR;  Service: Vascular;  Laterality: Left;  with Regional Block   HERNIA REPAIR     11 yrs ago    IR FLUORO GUIDE CV LINE RIGHT  01/24/2023   IR FLUORO GUIDE CV LINE RIGHT  03/03/2023   IR US GUIDE VASC ACCESS RIGHT  01/24/2023   OPEN REDUCTION INTERNAL FIXATION (ORIF) DISTAL RADIAL FRACTURE Left 09/03/2019   Procedure: OPEN REDUCTION INTERNAL FIXATION (ORIF) DISTAL RADIAL FRACTURE;  Surgeon: Mack Hook, MD;  Location: [facility];  Service: Orthopedics;  Laterality: Left;     Home Medications:  Prior to Admission medications   Medication Sig Start Date End Date Taking? Authorizing Provider  apixaban (ELIQUIS) 5 MG TABS tablet Take 1 tablet (5 mg total) by mouth 2 (two) times daily. 02/03/23  Yes Noralee Stain, DO  diltiazem (CARDIZEM CD) 180 MG 24 hr capsule Take 1 capsule (180 mg total) by mouth daily. 02/03/23  Yes Noralee Stain, DO  glipiZIDE (GLUCOTROL) 5 MG tablet Take 1 tablet (5 mg total) by mouth daily before breakfast. 02/03/23  Yes Noralee Stain, DO  metoprolol succinate (TOPROL-XL) 25 MG 24 hr tablet Take 12.5 mg by mouth daily.   Yes [provider]  midodrine (PROAMATINE) 10 MG tablet Take one tablet before dialysis to help keep your blood pressure up 05/20/23  Yes Elson Areas, PA-C  dicyclomine (BENTYL) 20 MG tablet Take 20 mg by mouth every 4 (four) hours as needed (Abdominal cramping/pain). 05/18/23 06/17/23  [provider]  ondansetron (ZOFRAN-ODT) 4 MG disintegrating tablet  Take 4 mg by mouth every 8 (eight) hours as needed for nausea or vomiting. 05/18/23 05/25/23  [provider]    Inpatient Medications: Scheduled Meds:  Continuous Infusions:  diltiazem (CARDIZEM) infusion 5 mg/hr (05/23/23 1544)   PRN Meds:   Allergies:   No Known Allergies  Social History:   Social History   Socioeconomic History   Marital status: Married    Spouse name: Not on file   Number of children: 5   Years of education: Not on file   Highest education level: Not on file  Occupational History    Employer: UNEMPLOYED  Tobacco Use   Smoking status: Never   Smokeless tobacco: Never  Vaping Use   Vaping status: Never Used  Substance and Sexual Activity   Alcohol use: No   Drug use: No   Sexual activity: Not on file  Other Topics Concern   Not on file  Social History Narrative  Not on file   Social Determinants of Health   Financial Resource Strain: Low Risk  (10/18/2020)   Received from [facility]   Overall Financial Resource Strain (CARDIA)    Difficulty of Paying Living Expenses: Not hard at all  Food Insecurity: No Food Insecurity (05/23/2023)   Hunger Vital Sign    Worried About Running Out of Food in the Last Year: Never true    Ran Out of Food in the Last Year: Never true  Transportation Needs: No Transportation Needs (05/23/2023)   PRAPARE - Administrator, Civil Service (Medical): No    Lack of Transportation (Non-Medical): No  Physical Activity: Not on file  Stress: Not on file  Social Connections: Unknown (01/16/2023)   Received from [facility]   Social Network    Social Network: Not on file  Intimate Partner Violence: Not At Risk (05/23/2023)   Humiliation, Afraid, Rape, and Kick questionnaire    Fear of Current or Ex-Partner: No    Emotionally Abused: No    Physically Abused: No    Sexually Abused: No    Family History:   Family History  Problem Relation Age of Onset   Heart attack Neg Hx       ROS:  Please see the history of present illness.   All other ROS reviewed and negative.     Physical Exam/Data:   Vitals:   05/23/23 1445 05/23/23 1507 05/23/23 1537 05/23/23 1540  BP: 119/79     Pulse: 85 96  (!) 111  Resp: 16 13  15   Temp:   98.4 F (36.9 C)   TempSrc:   Oral   SpO2: 94% 100%  100%   No intake or output data in the 24 hours ending 05/23/23 1611    05/20/2023    9:44 AM 04/14/2023    8:28 AM 03/21/2023    1:25 PM  Last 3 Weights  Weight (lbs) 196 lb 3.4 oz 198 lb 6.4 oz 197 lb 6.4 oz  Weight (kg) 89 kg 89.994 kg 89.54 kg     There is no height or weight on file to calculate BMI.  General:  Well nourished, well developed, in no acute distress HEENT: normal Neck: no JVD Vascular: No carotid bruits; Distal pulses 2+ bilaterally Cardiac: Irregularly irregular no murmur  Lungs:  clear to auscultation bilaterally, no wheezing, rhonchi or rales  Abd: soft, nontender, no hepatomegaly  Ext: no edema Musculoskeletal:  No deformities, BUE and BLE strength normal and equal Skin: warm and dry  Neuro:  CNs 2-12 intact, no focal abnormalities noted Psych:  Normal affect   EKG:  The EKG was personally reviewed and demonstrates: Atrial fibrillation on first EKG, normal sinus rhythm on second EKG Telemetry:  Telemetry was personally reviewed and demonstrates: Patient arrived in atrial fibrillation with RVR with heart rate in the 130s.  He converted back to normal sinus rhythm around 12:15 PM, however he converted back to atrial fibrillation around 13:30  Relevant CV Studies:  Echo 01/22/2023  1. Left ventricular ejection fraction, by estimation, is 55 to 60%. The  left ventricle has normal function. The left ventricle has no regional  wall motion abnormalities. There is moderate left ventricular hypertrophy.  Left ventricular diastolic  parameters are consistent with Grade I diastolic dysfunction (impaired  relaxation).   2. Right ventricular systolic function is  normal. The right ventricular  size is normal.   3. The mitral valve is normal in  structure. No evidence of mitral valve  regurgitation. No evidence of mitral stenosis.   4. Degree of AR not well interrogated No suprasternal notch doppler and  poor color flow but does not seen severe Consider f/u imaging for aortic  root enlargment with CTA or MRA . The aortic valve is tricuspid. There is  mild calcification of the aortic  valve. There is mild thickening of the aortic valve. Aortic valve  regurgitation is mild. Aortic valve sclerosis is present, with no evidence  of aortic valve stenosis.   5. Aortic dilatation noted. There is severe dilatation of the aortic  root, measuring 48 mm.   6. The inferior vena cava is normal in size with greater than 50%  respiratory variability, suggesting right atrial pressure of 3 mmHg.    Laboratory Data:  High Sensitivity Troponin:   Recent Labs  Lab 05/20/23 0945 05/20/23 1153  TROPONINIHS 14 12     Chemistry Recent Labs  Lab 05/20/23 0945 05/23/23 1138  NA 134* 140  K 3.5 3.9  CL 96* 102  CO2 24 26  GLUCOSE 145* 136*  BUN 46* 46*  CREATININE 6.05* 7.37*  CALCIUM 7.2* 6.8*  MG  --  1.9  GFRNONAA 9* 7*  ANIONGAP 14 12    Recent Labs  Lab 05/20/23 0945 05/23/23 1138  PROT 7.4 7.1  ALBUMIN 3.6 3.5  AST 18 15  ALT 13 14  ALKPHOS 90 92  BILITOT 0.5 0.6   Lipids No results for input(s): "CHOL", "TRIG", "HDL", "LABVLDL", "LDLCALC", "CHOLHDL" in the last 168 hours.  Hematology Recent Labs  Lab 05/20/23 0945 05/23/23 1138  WBC 8.3 8.1  RBC 3.46* 3.55*  HGB 11.1* 11.3*  HCT 33.4* 34.6*  MCV 96.5 97.5  MCH 32.1 31.8  MCHC 33.2 32.7  RDW 12.9 13.1  PLT 177 208   Thyroid No results for input(s): "TSH", "FREET4" in the last 168 hours.  BNPNo results for input(s): "BNP", "PROBNP" in the last 168 hours.  DDimer No results for input(s): "DDIMER" in the last 168 hours.   Radiology/Studies:  DG Chest Port 1 View  Result  Date: 05/23/2023 CLINICAL DATA:  Palpitations and shortness of breath EXAM: PORTABLE CHEST 1 VIEW COMPARISON:  05/20/2023. FINDINGS: Double-lumen right IJ catheter in place. Tip of the SVC right atrial junction. Stable cardiopericardial silhouette. Underinflation. Basilar atelectasis. No pneumothorax or edema. No consolidation. The left inferior costophrenic angle is clipped off the edge of the film. IMPRESSION: Right IJ catheter. Underinflation with some basilar atelectasis. Electronically Signed   By: Karen Kays M.D.   On: 05/23/2023 13:25   DG Chest Port 1 View  Result Date: 05/20/2023 CLINICAL DATA:  72 year old patient with tachycardia and history of dialysis. EXAM: PORTABLE CHEST 1 VIEW COMPARISON:  Chest radiograph dated January 22, 2023. FINDINGS: The heart size is borderline enlarged. The mediastinal contours are within normal limits. Right IJ hemodialysis catheter tip at the level of the lower SVC. No focal consolidation. Streaky opacities at the left lung base may represent atelectasis or scarring. No pneumothorax or sizable pleural effusion. Overlapping telemetry wires. No acute osseous abnormality. IMPRESSION: No acute cardiopulmonary findings. Electronically Signed   By: Hart Robinsons M.D.   On: 05/20/2023 11:02     Assessment and Plan:   Recurrent atrial fibrillation: Patient had multiple atrial fibrillation in the past week, especially during dialysis.  Given the frequent recurrence of atrial fibrillation, I believe the patient will need rhythm control at this point.  Will discuss with  MD, potentially back off on rate control by stopping Cardizem, continue on beta-blocker, start on amiodarone drip overnight and transition to oral amiodarone tomorrow prior to discharge.  Hypertension: Likely will need to initiate antiarrhythmic therapy, patient has had significant hypotension during dialysis require PRN midodrine, recommend continuing metoprolol succinate and stop  diltiazem  Hyperlipidemia: He is no longer on statin medication for some reason, he has a history of stroke, he need to resume on Lipitor  DM2: Will defer management to primary team  ESRD on HD Tuesday Thursday and Saturday  Thoracic aortic aneurysm: Dilated thoracic aorta measured at 4.8 cm on last echocardiogram in July 2024.  History of CVA: Occurred in June 2024, has since restarted on Eliquis.   Risk Assessment/Risk Scores:          CHA2DS2-VASc Score = 6   This indicates a 9.7% annual risk of stroke. The patient's score is based upon: CHF History: 1 HTN History: 1 Diabetes History: 1 Stroke History: 2 Vascular Disease History: 0 Age Score: 1 Gender Score: 0         For questions or updates, please contact [facility] Please consult [email or web address] for contact info under    Signed, Azalee Course, [place]  05/23/2023 4:11 PM    Patient seen and examined. Agree with assessment and plan.  Mr. Georg Ang is a 72 year old gentleman originally from [place] who has a history of PAF, hypertension, hyperlipidemia, type 2 diabetes mellitus, and since July 2024 has been on dialysis for development of end-stage renal disease.  He has been followed in [place].  He had suffered an acute CVA in June 2020 for an MRI showed CVA involving the left caudate nucleus and corona radiata.  Subsequently, he developed worsening renal function and was placed on dialysis with his first treatment on January 25, 2023.  He underwent vascular surgery with fistula creation in July.  He has had recurrent episodes of atrial fibrillation.  He had been treated with Eliquis and was treated with IV diltiazem and later transition to oral Cardizem to take in addition to metoprolol succinate.  During his hospitalization in July echo Doppler study showed EF 55 to 60% with grade 1 diastolic dysfunction, aortic root dilatation at 48 mm, mild AI.  He recently underwent left arm brachial basilic transposition in late  September.  Over the past week he has had recurrent episodes of atrial fibrillation.  He also has had issues with low blood pressure following dialysis leading to initiation of midodrine.  This morning the patient again developed recurrent atrial fibrillation and upon arrival in A-fib with RVR.  Since being in the ER he is flipping in and out of atrial fibrillation despite IV Cardizem drip.  While I am starting to see him, he was in atrial fibrillation and then again flipped back into sinus rhythm.  He has continued to have issues with hiccups and according to his family member when the hiccups intensified he often has more A-fib.  On exam he is in no acute distress.  Initially A-fib rate was around 110.  With conversion transiently heart rate had dropped into the 80s.  There is no JVD.  There is no wheezing.  Rhythm is intermittently irregularly irregular consistent with A-fib.  Faint 1/6 systolic murmur.  Lungs are relatively clear without audible wheezes.  He has mid abdominal surgical scar secondary to appendicitis surgery.  Abdomen is nontender.  There is no significant edema.  His most recent echo in July was reviewed which  showed EF 55 to 60% with moderate LVH and grade 1 diastolic dysfunction.  There was aortic sclerosis without stenosis and there was significant dilatation of aortic root at 48 mm.  With the patient's history of blood pressure dropping following dialysis I agree with how Lisabeth Devoid, PA assessment to attempt warm aggressive rhythm control and discontinue diltiazem, continue beta-blocker and initiate IV amiodarone overnight with transition to oral amiodarone tomorrow.  He was seen by Dr. Lionel December of nephrology with plans for dialysis tomorrow.  Will follow.   Lennette Bihari, MD, [facility] 05/23/2023 5:06 PM

## 2023-05-24 ENCOUNTER — Observation Stay (HOSPITAL_COMMUNITY): Payer: MEDICAID

## 2023-05-24 DIAGNOSIS — N250 Renal osteodystrophy: Secondary | ICD-10-CM | POA: Diagnosis present

## 2023-05-24 DIAGNOSIS — R066 Hiccough: Secondary | ICD-10-CM | POA: Diagnosis present

## 2023-05-24 DIAGNOSIS — E785 Hyperlipidemia, unspecified: Secondary | ICD-10-CM | POA: Diagnosis present

## 2023-05-24 DIAGNOSIS — N186 End stage renal disease: Secondary | ICD-10-CM | POA: Diagnosis present

## 2023-05-24 DIAGNOSIS — D631 Anemia in chronic kidney disease: Secondary | ICD-10-CM | POA: Diagnosis present

## 2023-05-24 DIAGNOSIS — I712 Thoracic aortic aneurysm, without rupture, unspecified: Secondary | ICD-10-CM | POA: Diagnosis present

## 2023-05-24 DIAGNOSIS — I4891 Unspecified atrial fibrillation: Principal | ICD-10-CM | POA: Diagnosis present

## 2023-05-24 DIAGNOSIS — Z79899 Other long term (current) drug therapy: Secondary | ICD-10-CM | POA: Diagnosis not present

## 2023-05-24 DIAGNOSIS — I480 Paroxysmal atrial fibrillation: Secondary | ICD-10-CM | POA: Diagnosis present

## 2023-05-24 DIAGNOSIS — Z7984 Long term (current) use of oral hypoglycemic drugs: Secondary | ICD-10-CM | POA: Diagnosis not present

## 2023-05-24 DIAGNOSIS — I120 Hypertensive chronic kidney disease with stage 5 chronic kidney disease or end stage renal disease: Secondary | ICD-10-CM | POA: Diagnosis present

## 2023-05-24 DIAGNOSIS — Z992 Dependence on renal dialysis: Secondary | ICD-10-CM | POA: Diagnosis not present

## 2023-05-24 DIAGNOSIS — E1122 Type 2 diabetes mellitus with diabetic chronic kidney disease: Secondary | ICD-10-CM | POA: Diagnosis present

## 2023-05-24 DIAGNOSIS — K439 Ventral hernia without obstruction or gangrene: Secondary | ICD-10-CM | POA: Diagnosis present

## 2023-05-24 DIAGNOSIS — J9811 Atelectasis: Secondary | ICD-10-CM | POA: Diagnosis present

## 2023-05-24 DIAGNOSIS — Z7901 Long term (current) use of anticoagulants: Secondary | ICD-10-CM | POA: Diagnosis not present

## 2023-05-24 DIAGNOSIS — Z8673 Personal history of transient ischemic attack (TIA), and cerebral infarction without residual deficits: Secondary | ICD-10-CM | POA: Diagnosis not present

## 2023-05-24 LAB — RENAL FUNCTION PANEL
Albumin: 3 g/dL — ABNORMAL LOW (ref 3.5–5.0)
Anion gap: 12 (ref 5–15)
BUN: 60 mg/dL — ABNORMAL HIGH (ref 8–23)
CO2: 26 mmol/L (ref 22–32)
Calcium: 6.5 mg/dL — ABNORMAL LOW (ref 8.9–10.3)
Chloride: 103 mmol/L (ref 98–111)
Creatinine, Ser: 8.11 mg/dL — ABNORMAL HIGH (ref 0.61–1.24)
GFR, Estimated: 6 mL/min — ABNORMAL LOW (ref >60)
Glucose, Bld: 183 mg/dL — ABNORMAL HIGH (ref 70–99)
Phosphorus: 4.7 mg/dL — ABNORMAL HIGH (ref 2.5–4.6)
Potassium: 3.5 mmol/L (ref 3.5–5.1)
Sodium: 141 mmol/L (ref 135–145)

## 2023-05-24 LAB — GLUCOSE, CAPILLARY
Glucose-Capillary: 120 mg/dL — ABNORMAL HIGH (ref 70–99)
Glucose-Capillary: 207 mg/dL — ABNORMAL HIGH (ref 70–99)
Glucose-Capillary: 103 mg/dL — ABNORMAL HIGH (ref 70–99)
Glucose-Capillary: 279 mg/dL — ABNORMAL HIGH (ref 70–99)

## 2023-05-24 LAB — CBC WITH DIFFERENTIAL/PLATELET
Abs Immature Granulocytes: 0.06 10*3/uL (ref 0.00–0.07)
Basophils Absolute: 0 10*3/uL (ref 0.0–0.1)
Basophils Relative: 1 %
Eosinophils Absolute: 0.2 10*3/uL (ref 0.0–0.5)
Eosinophils Relative: 2 %
HCT: 28.6 % — ABNORMAL LOW (ref 39.0–52.0)
Hemoglobin: 9.5 g/dL — ABNORMAL LOW (ref 13.0–17.0)
Immature Granulocytes: 1 %
Lymphocytes Relative: 35 %
Lymphs Abs: 2.8 10*3/uL (ref 0.7–4.0)
MCH: 32.2 pg (ref 26.0–34.0)
MCHC: 33.2 g/dL (ref 30.0–36.0)
MCV: 96.9 fL (ref 80.0–100.0)
Monocytes Absolute: 0.6 10*3/uL (ref 0.1–1.0)
Monocytes Relative: 8 %
Neutro Abs: 4.2 10*3/uL (ref 1.7–7.7)
Neutrophils Relative %: 53 %
Platelets: 188 10*3/uL (ref 150–400)
RBC: 2.95 MIL/uL — ABNORMAL LOW (ref 4.22–5.81)
RDW: 13.2 % (ref 11.5–15.5)
WBC: 7.8 10*3/uL (ref 4.0–10.5)
nRBC: 0 % (ref 0.0–0.2)

## 2023-05-24 LAB — LIPID PANEL
Cholesterol: 110 mg/dL (ref 0–200)
HDL: 29 mg/dL — ABNORMAL LOW (ref >40)
LDL Cholesterol: 58 mg/dL (ref 0–99)
Total CHOL/HDL Ratio: 3.8 {ratio}
Triglycerides: 113 mg/dL (ref <150)
VLDL: 23 mg/dL (ref 0–40)

## 2023-05-24 LAB — TSH: TSH: 4.447 u[IU]/mL (ref 0.350–4.500)

## 2023-05-24 MED ORDER — ANTICOAGULANT SODIUM CITRATE 4% (200MG/5ML) IV SOLN: 5.0000 mL | Status: DC | PRN

## 2023-05-24 MED ORDER — CHLORHEXIDINE GLUCONATE CLOTH 2 % EX PADS
6.0000 | MEDICATED_PAD | Freq: Every day | CUTANEOUS | Status: DC
  Administered 2023-05-24: 6 via TOPICAL

## 2023-05-24 MED ORDER — HEPARIN SODIUM (PORCINE) 1000 UNIT/ML IJ SOLN
3800.0000 [IU] | Freq: Once | INTRAMUSCULAR | Status: AC
  Administered 2023-05-24: 3800 [IU]

## 2023-05-24 MED ORDER — PENTAFLUOROPROP-TETRAFLUOROETH EX AERO: 1.0000 | INHALATION_SPRAY | CUTANEOUS | Status: DC | PRN

## 2023-05-24 MED ORDER — ALTEPLASE 2 MG IJ SOLR: 2.0000 mg | Freq: Once | INTRAMUSCULAR | Status: DC | PRN

## 2023-05-24 MED ORDER — LIDOCAINE HCL (PF) 1 % IJ SOLN: 5.0000 mL | INTRAMUSCULAR | Status: DC | PRN

## 2023-05-24 MED ORDER — LIDOCAINE-PRILOCAINE 2.5-2.5 % EX CREA: 1.0000 | TOPICAL_CREAM | CUTANEOUS | Status: DC | PRN

## 2023-05-24 MED ORDER — MIDODRINE HCL 5 MG PO TABS
10.0000 mg | ORAL_TABLET | ORAL | Status: DC
  Administered 2023-05-24: 10 mg via ORAL

## 2023-05-24 MED ORDER — NEPRO/CARBSTEADY PO LIQD: 237.0000 mL | ORAL | Status: DC | PRN

## 2023-05-24 MED ORDER — PANTOPRAZOLE SODIUM 40 MG PO TBEC
40.0000 mg | DELAYED_RELEASE_TABLET | Freq: Every day | ORAL | Status: DC
  Administered 2023-05-24 – 2023-05-25 (×2): 40 mg via ORAL
  Filled 2023-05-24 (×2): qty 1

## 2023-05-24 MED ORDER — METOPROLOL TARTRATE 5 MG/5ML IV SOLN
5.0000 mg | INTRAVENOUS | Status: DC | PRN
Start: 2023-05-24 — End: 2023-05-25
  Administered 2023-05-24 (×2): 5 mg via INTRAVENOUS
  Filled 2023-05-24 (×2): qty 5

## 2023-05-24 MED ORDER — HEPARIN SODIUM (PORCINE) 1000 UNIT/ML DIALYSIS: 1000.0000 [IU] | INTRAMUSCULAR | Status: DC | PRN

## 2023-05-24 NOTE — Progress Notes (Signed)
Rounding Note    Patient Name: [name] Date of Encounter: 05/24/2023  [facility] Cardiologist: Dr. Sharol Given of [facility] in [place]   Subjective   No complaints  Inpatient Medications    Scheduled Meds:  apixaban  5 mg Oral BID   Chlorhexidine Gluconate Cloth  6 each Topical Daily   glipiZIDE  2.5 mg Oral QAC breakfast   insulin aspart  0-5 Units Subcutaneous QHS   insulin aspart  0-6 Units Subcutaneous TID WC   midodrine  10 mg Oral Q T,Th,Sa-HD   Continuous Infusions:  amiodarone 30 mg/hr (05/24/23 0736)   anticoagulant sodium citrate     PRN Meds: alteplase, anticoagulant sodium citrate, chlorproMAZINE, dicyclomine, feeding supplement (NEPRO CARB STEADY), heparin, lidocaine (PF), lidocaine-prilocaine, ondansetron, mouth rinse, pentafluoroprop-tetrafluoroeth   Vital Signs    Vitals:   05/24/23 0200 05/24/23 0300 05/24/23 0400 05/24/23 0808  BP: (!) 126/37 (!) 119/90 130/62   Pulse: 72 67 63   Resp:   16 15  Temp:    97.8 F (36.6 C)  TempSrc:    Oral  SpO2: 96% 100% 93%     Intake/Output Summary (Last 24 hours) at 05/24/2023 1101 Last data filed at 05/24/2023 0452 Gross per 24 hour  Intake 471.11 ml  Output --  Net 471.11 ml      05/20/2023    9:44 AM 04/14/2023    8:28 AM 03/21/2023    1:25 PM  Last 3 Weights  Weight (lbs) 196 lb 3.4 oz 198 lb 6.4 oz 197 lb 6.4 oz  Weight (kg) 89 kg 89.994 kg 89.54 kg      Telemetry    NSR - Personally Reviewed  ECG    N/a - Personally Reviewed  Physical Exam   GEN: No acute distress.   Neck: No JVD Cardiac: RRR, no murmurs, rubs, or gallops.  Respiratory: Clear to auscultation bilaterally. GI: Soft, nontender, non-distended  MS: No edema; No deformity. Neuro:  Nonfocal  Psych: Normal affect   Labs    High Sensitivity Troponin:   Recent Labs  Lab 05/20/23 0945 05/20/23 1153  TROPONINIHS 14 12     Chemistry Recent Labs  Lab 05/20/23 0945  05/23/23 1138  NA 134* 140  K 3.5 3.9  CL 96* 102  CO2 24 26  GLUCOSE 145* 136*  BUN 46* 46*  CREATININE 6.05* 7.37*  CALCIUM 7.2* 6.8*  MG  --  1.9  PROT 7.4 7.1  ALBUMIN 3.6 3.5  AST 18 15  ALT 13 14  ALKPHOS 90 92  BILITOT 0.5 0.6  GFRNONAA 9* 7*  ANIONGAP 14 12    Lipids  Recent Labs  Lab 05/24/23 0352  CHOL 110  TRIG 113  HDL 29*  LDLCALC 58  CHOLHDL 3.8    Hematology Recent Labs  Lab 05/20/23 0945 05/23/23 1138  WBC 8.3 8.1  RBC 3.46* 3.55*  HGB 11.1* 11.3*  HCT 33.4* 34.6*  MCV 96.5 97.5  MCH 32.1 31.8  MCHC 33.2 32.7  RDW 12.9 13.1  PLT 177 208   Thyroid  Recent Labs  Lab 05/24/23 0352  TSH 4.447    BNPNo results for input(s): "BNP", "PROBNP" in the last 168 hours.  DDimer No results for input(s): "DDIMER" in the last 168 hours.   Radiology    DG Chest Port 1 View  Result Date: 05/23/2023 CLINICAL DATA:  Palpitations and shortness of breath EXAM: PORTABLE CHEST 1 VIEW COMPARISON:  05/20/2023. FINDINGS: Double-lumen right IJ  catheter in place. Tip of the SVC right atrial junction. Stable cardiopericardial silhouette. Underinflation. Basilar atelectasis. No pneumothorax or edema. No consolidation. The left inferior costophrenic angle is clipped off the edge of the film. IMPRESSION: Right IJ catheter. Underinflation with some basilar atelectasis. Electronically Signed   By: Karen Kays M.D.   On: 05/23/2023 13:25    Cardiac Studies    Patient Profile     [name] is a 72 y.o. male with a hx of PAF, HTN, HLD, DM II, ESRD on HD TThSat, CVA 6/24 and thoracic aortic aneurysm who is being seen 05/23/2023 for the evaluation of recurrent atrial fibrillation at the request of Dr. Anitra Lauth.   Assessment & Plan    1.PAF - progressing issues with episodes of afib over the last week, presented with afib with RVR - management complicated by ESRD, low bp's with HD requiring prn midodrine.   -started on IV amiodarone this admission (150mg   load yesterday at 6pm, now on drip at 30mg /hr).  - would continue IV amio to tomorrow AM, transition to oral if remains stable,. WIll need to monitor rhythm during HD today as well to see if he is remaining controlled - continue eliquis for stroke prevention.    2. ESRD - plans for HD today  3. Thoracic aortic aneurysm - 01/2023 echo root 4.8 cm, ascending not well visualized. - 12/2022 echo at [facility] reported at 4 cm.  - 11/2020 echo 4.2 cm aortic root - I reviewed the echo in our system, agree with 4.8 cm root. Cannot verify the reported root of 4 cm just 1 month prior at [facility]  - discuss with primary team obtaining CTA to further evaluate.   4. History of CVA - unlcear statin history, review.    For questions or updates, please contact [facility] Please consult [email or web address] for contact info under        Signed, Dina Rich, MD  05/24/2023, 11:01 AM

## 2023-05-24 NOTE — Progress Notes (Addendum)
PROGRESS NOTE    [name]  BMW:[id number] DOB: [birth year] DOA: 05/23/2023 PCP: Hattie Perch, FNP  Chief Complaint  Patient presents with   Irregular Heart Beat    Brief Narrative:   [name] is [name] 72 y.o. Spanish-speaking male with history h/o hypertension, hyperlipidemia, DM, paroxysmal Dezirae Service-fib, CVA, thoracic aortic aneurysm, ESRD, recently initiated on HD TTS brought in by daughter and concern for recurrent episode of palpitations/[name]-fib with RVR.  Per daughter, patient usually takes both metoprolol and Cardizem for atrial fibrillation rate control.  Since initiated on dialysis patient has had episodes of palpitations that have been mostly occurring on the night of his dialysis session.  He has had ED visits and hospitalizations for this.  Past weekend, patient had episode of nausea and diarrhea for which she was seen at [facility] in [place] and told to have Niko Jakel stomach virus.  Patient did recover within [name] day in terms of diarrhea but has had poor eating (only about 25% per daughter).  Past Tuesday patient underwent his scheduled HD but had to be aborted after 2 hours due to [name]-fib with RVR and hypotension and sent to [facility].  Patient was given metoprolol additional dose and he apparently converted to sinus rhythm.  At that time he was advised to take metoprolol prior to dialysis in order to avoid postdialysis nocturnal palpitations.  He was also prescribed midodrine for blood pressure tolerance to be taken along with beta-blocker.  Subsequently, he underwent dialysis on Thursday and did take metoprolol/midodrine prior to the session.  However later in the night (last night), experienced palpitations and presented to the ED again.  Patient otherwise denies any chest pain, dizziness or nausea or vomiting or diarrhea.  He currently is hungry and eating [name] sandwich during the interview.  He denies any leg swellings.  Patient does complain of hiccups that he has been having for Carolyne Lynn  week now--noted during history physical as well.. ED course: Afebrile, HR 115-130 on the monitor when I was in the room, BP 124/78, respiratory rate 16, O2 sat 100% on room air.  Labs show WBC 8.1, hemoglobin 11.3, hematocrit 34.6, platelet 208, sodium 140, potassium 3.9, chloride 102, bicarb 26, BUN 46, creatinine 7.37, calcium 6.8, glucose 136, magnesium 1.9.  On arrival patient's heart rate was 140s and received IV metoprolol 5 mg after which he converted to normal sinus rhythm.  He however went back to [name]-fib with RVR with heart rate in 120s when cardiology was consulted and they recommended to admit to hospitalist service, and need for antiarrhythmics at this point.    Assessment & Plan:   Principal Problem:   Atrial fibrillation with tachycardic ventricular rate (HCC) Active Problems:   Ventral hernia   T2DM (type 2 diabetes mellitus) (HCC)   Hypertension   H/O: CVA (cerebrovascular accident)   ESRD (end stage renal disease) (HCC)   Hyperlipidemia   Hiccoughs  1.  Recurrent atrial fibrillation with RVR:  Cardiology recommending continue amiodarone until tmrw AM, transition to oral if stable Eliquis  Addendum: RVR after dialysis, will order IV metop prn for sustained HR over 120.   2.  Intractable hiccups: LFTs appear to be within normal limits.  BUN somewhat elevated but not significant enough to explain causing hiccups.  Chest x-ray showed bibasilar atelectasis but no pneumonia.  Will obtain liver ultrasound (no acute abnormality of liver or gallbladder).  PPI. Follow outpatient if persistent  Thoracic Aortic Aneurysm CTA pendign    3.  History of CVA: In 12/2022 showing left caudate nucleus and corona radiata infarct.  Resume home medications including Eliquis.   4.  Diabetes mellitus: Resume glipizide at lower dose while in the hospital and monitor blood glucose levels with sliding scale coverage.  Can resume home dose of glipizide if blood glucose elevated.   5.  ESRD:   Dialysis per renal    6. Hyperlipidemia: Not on any antihyperlipidemics per home med list.  LDL 58.    DVT prophylaxis: eliquis Code Status: full Family Communication: daughter Disposition:   Status is: Observation The patient remains OBS appropriate and will d/c before 2 midnights.   Consultants:  Renal cards  Procedures:  none  Antimicrobials:  Anti-infectives (From admission, onward)    None       Subjective: Daughter assisted with interpretation No complaints today, hiccups are most bothersome issue that persists  Objective: Vitals:   05/24/23 1345 05/24/23 1400 05/24/23 1430 05/24/23 1500  BP: (!) 143/75 (!) 146/79 121/88 (!) 142/82  Pulse: 70 67 71 72  Resp: 16 (!) 22 13 15   Temp: 98.3 F (36.8 C)     TempSrc:      SpO2: 100% 98% 99% 99%  Weight: 93.1 kg       Intake/Output Summary (Last 24 hours) at 05/24/2023 1540 Last data filed at 05/24/2023 0452 Gross per 24 hour  Intake 471.11 ml  Output --  Net 471.11 ml   Filed Weights   05/24/23 1345  Weight: 93.1 kg    Examination:  General exam: Appears calm and comfortable  Respiratory system: Clear to auscultation. Respiratory effort normal. Cardiovascular system: RRR Gastrointestinal system: Abdomen is nondistended, soft and nontender.  Central nervous system: Alert and oriented. No focal neurological deficits. Extremities: no LEE    Data Reviewed: I have personally reviewed following labs and imaging studies  CBC: Recent Labs  Lab 05/20/23 0945 05/23/23 1138 05/24/23 1400  WBC 8.3 8.1 7.8  NEUTROABS 5.1 4.4 4.2  HGB 11.1* 11.3* 9.5*  HCT 33.4* 34.6* 28.6*  MCV 96.5 97.5 96.9  PLT 177 208 188    Basic Metabolic Panel: Recent Labs  Lab 05/20/23 0945 05/23/23 1138 05/23/23 1905 05/24/23 1400  NA 134* 140  --  141  K 3.5 3.9  --  3.5  CL 96* 102  --  103  CO2 24 26  --  26  GLUCOSE 145* 136*  --  183*  BUN 46* 46*  --  60*  CREATININE 6.05* 7.37*  --  8.11*  CALCIUM  7.2* 6.8*  --  6.5*  MG  --  1.9  --   --   PHOS  --   --  4.4 4.7*    GFR: Estimated Creatinine Clearance: 8.9 mL/min (Jaelyne Deeg) (by C-G formula based on SCr of 8.11 mg/dL (H)).  Liver Function Tests: Recent Labs  Lab 05/20/23 0945 05/23/23 1138 05/24/23 1400  AST 18 15  --   ALT 13 14  --   ALKPHOS 90 92  --   BILITOT 0.5 0.6  --   PROT 7.4 7.1  --   ALBUMIN 3.6 3.5 3.0*    CBG: Recent Labs  Lab 05/23/23 2004 05/24/23 0807 05/24/23 1153  GLUCAP 164* 120* 207*     No results found for this or any previous visit (from the past 240 hour(s)).       Radiology Studies: US Abdomen Limited RUQ (LIVER/GB)  Result Date: 05/24/2023 CLINICAL DATA:  Intractable hiccups EXAM: ULTRASOUND ABDOMEN  LIMITED RIGHT UPPER QUADRANT COMPARISON:  CT abdomen pelvis 11/21/2010 FINDINGS: Gallbladder: Gallstones: None Sludge: None Gallbladder Wall: Within normal limits Pericholecystic fluid: None Sonographic Murphy's Sign: Negative per technologist Common bile duct: Diameter: 3 mm Liver: Parenchymal echogenicity: Homogeneously increased Contours: Normal Lesions: None Portal vein: Patent.  Hepatopetal flow Other: None. IMPRESSION: 1. No acute abnormality of the liver or gallbladder. 2. Diffuse increased echogenicity of the hepatic parenchyma is Jashira Cotugno nonspecific indicator of hepatocellular dysfunction, most commonly steatosis. Electronically Signed   By: Acquanetta Belling M.D.   On: 05/24/2023 11:23   DG Chest Port 1 View  Result Date: 05/23/2023 CLINICAL DATA:  Palpitations and shortness of breath EXAM: PORTABLE CHEST 1 VIEW COMPARISON:  05/20/2023. FINDINGS: Double-lumen right IJ catheter in place. Tip of the SVC right atrial junction. Stable cardiopericardial silhouette. Underinflation. Basilar atelectasis. No pneumothorax or edema. No consolidation. The left inferior costophrenic angle is clipped off the edge of the film. IMPRESSION: Right IJ catheter. Underinflation with some basilar atelectasis.  Electronically Signed   By: Karen Kays M.D.   On: 05/23/2023 13:25        Scheduled Meds:  apixaban  5 mg Oral BID   Chlorhexidine Gluconate Cloth  6 each Topical Daily   glipiZIDE  2.5 mg Oral QAC breakfast   heparin sodium (porcine)  3,800 Units Intracatheter Once   insulin aspart  0-5 Units Subcutaneous QHS   insulin aspart  0-6 Units Subcutaneous TID WC   midodrine  10 mg Oral Q T,Th,Sa-HD   pantoprazole  40 mg Oral Daily   Continuous Infusions:  amiodarone 30 mg/hr (05/24/23 0736)   anticoagulant sodium citrate       LOS: 0 days    Time spent: over 30 min    Lacretia Nicks, MD [facility]   To contact the attending provider between 7A-7P or the covering provider during after hours 7P-7A, please log into the web site [email or web address] and access using universal Gordon password for that web site. If you do not have the password, please call the hospital operator.  05/24/2023, 3:40 PM

## 2023-05-24 NOTE — Progress Notes (Signed)
[facility] Progress Note  Subjective: seen in room, no c/o's, family / daughter at bedside. No CP or SOB.   Vitals:   05/24/23 0200 05/24/23 0300 05/24/23 0400 05/24/23 0808  BP: (!) 126/37 (!) 119/90 130/62   Pulse: 72 67 63   Resp:   16 15  Temp:    97.8 F (36.6 C)  TempSrc:    Oral  SpO2: 96% 100% 93%     Exam: Gen alert, no distress No jvd or bruits Chest clear bilat to bases RRR no MRG Abd soft ntnd no mass or ascites +bs Ext no LE edema Neuro is alert, Ox 3 , nf    LUA AVF+bruit / RIJ TDC      Renal-related home meds: - eliuqis 5 bid - cardizem cd 180 every day - metoprolol xl 12.5 every day - midodrine 10mg  pre hd tts      OP HD: TTS [facility]  - no answer, get records monday     Assessment/ Plan: Atrial fib w/ RVR - per pmd/ cardiology [facility] - on HD TTS. HD today.  HTN/ BP - BP's wnl today. Gets midodrine pre HD 10mg . Cont home meds.  Volume - no sig vol excess on exam. Plan UF goal 2 L.  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 Get records Monday.        Vinson Moselle MD  [facility] 05/24/2023, 11:54 AM  Recent Labs  Lab 05/20/23 0945 05/23/23 1138 05/23/23 1905  HGB 11.1* 11.3*  --   ALBUMIN 3.6 3.5  --   CALCIUM 7.2* 6.8*  --   PHOS  --   --  4.4  CREATININE 6.05* 7.37*  --   K 3.5 3.9  --    No results for input(s): "IRON", "TIBC", "FERRITIN" in the last 168 hours. Inpatient medications:  apixaban  5 mg Oral BID   Chlorhexidine Gluconate Cloth  6 each Topical Daily   glipiZIDE  2.5 mg Oral QAC breakfast   insulin aspart  0-5 Units Subcutaneous QHS   insulin aspart  0-6 Units Subcutaneous TID WC   midodrine  10 mg Oral Q T,Th,Sa-HD   pantoprazole  40 mg Oral Daily    amiodarone 30 mg/hr (05/24/23 0736)   anticoagulant sodium citrate     alteplase, anticoagulant sodium citrate, chlorproMAZINE, dicyclomine, feeding supplement (NEPRO CARB STEADY), heparin, lidocaine (PF),  lidocaine-prilocaine, ondansetron, mouth rinse, pentafluoroprop-tetrafluoroeth

## 2023-05-25 ENCOUNTER — Inpatient Hospital Stay (HOSPITAL_COMMUNITY): Payer: MEDICAID

## 2023-05-25 LAB — BASIC METABOLIC PANEL
Anion gap: 14 (ref 5–15)
BUN: 33 mg/dL — ABNORMAL HIGH (ref 8–23)
CO2: 27 mmol/L (ref 22–32)
Calcium: 7.4 mg/dL — ABNORMAL LOW (ref 8.9–10.3)
Chloride: 94 mmol/L — ABNORMAL LOW (ref 98–111)
Creatinine, Ser: 5.8 mg/dL — ABNORMAL HIGH (ref 0.61–1.24)
GFR, Estimated: 10 mL/min — ABNORMAL LOW (ref >60)
Glucose, Bld: 111 mg/dL — ABNORMAL HIGH (ref 70–99)
Potassium: 3.6 mmol/L (ref 3.5–5.1)
Sodium: 135 mmol/L (ref 135–145)

## 2023-05-25 LAB — CBC
HCT: 33.7 % — ABNORMAL LOW (ref 39.0–52.0)
Hemoglobin: 11.5 g/dL — ABNORMAL LOW (ref 13.0–17.0)
MCH: 32.5 pg (ref 26.0–34.0)
MCHC: 34.1 g/dL (ref 30.0–36.0)
MCV: 95.2 fL (ref 80.0–100.0)
Platelets: 214 10*3/uL (ref 150–400)
RBC: 3.54 MIL/uL — ABNORMAL LOW (ref 4.22–5.81)
RDW: 13.1 % (ref 11.5–15.5)
WBC: 7.7 10*3/uL (ref 4.0–10.5)
nRBC: 0 % (ref 0.0–0.2)

## 2023-05-25 LAB — GLUCOSE, CAPILLARY
Glucose-Capillary: 110 mg/dL — ABNORMAL HIGH (ref 70–99)
Glucose-Capillary: 115 mg/dL — ABNORMAL HIGH (ref 70–99)

## 2023-05-25 LAB — CALCIUM, IONIZED
Calcium, Ionized, Serum: 3.2 mg/dL — ABNORMAL LOW (ref 4.5–5.6)
Calcium, Ionized, Serum: 3.5 mg/dL — ABNORMAL LOW (ref 4.5–5.6)

## 2023-05-25 LAB — PHOSPHORUS: Phosphorus: 4.6 mg/dL (ref 2.5–4.6)

## 2023-05-25 LAB — MAGNESIUM: Magnesium: 1.8 mg/dL (ref 1.7–2.4)

## 2023-05-25 MED ORDER — PANTOPRAZOLE SODIUM 40 MG PO TBEC: 40.0000 mg | DELAYED_RELEASE_TABLET | Freq: Every day | ORAL | 1 refills | Status: DC

## 2023-05-25 MED ORDER — IOHEXOL 350 MG/ML SOLN
75.0000 mL | Freq: Once | INTRAVENOUS | Status: AC | PRN
  Administered 2023-05-25: 75 mL via INTRAVENOUS

## 2023-05-25 MED ORDER — AMIODARONE HCL 200 MG PO TABS: ORAL_TABLET | ORAL | 0 refills | Status: AC

## 2023-05-25 MED ORDER — AMIODARONE HCL 200 MG PO TABS
200.0000 mg | ORAL_TABLET | Freq: Two times a day (BID) | ORAL | Status: DC
  Administered 2023-05-25: 200 mg via ORAL
  Filled 2023-05-25: qty 1

## 2023-05-25 NOTE — Progress Notes (Signed)
Telemetry reviewed, did have some episodes of afib yesterday though fairly low burden. Remains on amio gtt, transition to oral amio 200mg  bid x 3 weeks then 200mg  daily. Afib burden should continue to decrease with ongoing oral load. Ok for discharge, we will arrange f/u. We will sign off inpatient care.    Dominga Ferry MD

## 2023-05-25 NOTE — Plan of Care (Signed)
Problem: Education: Goal: Knowledge of General Education information will improve Description: Including pain rating scale, medication(s)/side effects and non-pharmacologic comfort measures Outcome: Progressing   Problem: Health Behavior/Discharge Planning: Goal: Ability to manage health-related needs will improve Outcome: Progressing   Problem: Clinical Measurements: Goal: Ability to maintain clinical measurements within normal limits will improve Outcome: Progressing Goal: Will remain free from infection Outcome: Progressing Goal: Diagnostic test results will improve Outcome: Progressing Goal: Respiratory complications will improve Outcome: Progressing Goal: Cardiovascular complication will be avoided Outcome: Progressing   Problem: Activity: Goal: Risk for activity intolerance will decrease Outcome: Progressing   Problem: Nutrition: Goal: Adequate nutrition will be maintained Outcome: Progressing   Problem: Coping: Goal: Level of anxiety will decrease Outcome: Progressing   Problem: Elimination: Goal: Will not experience complications related to bowel motility Outcome: Progressing Goal: Will not experience complications related to urinary retention Outcome: Progressing   Problem: Pain Management: Goal: General experience of comfort will improve Outcome: Progressing   Problem: Safety: Goal: Ability to remain free from injury will improve Outcome: Progressing   Problem: Skin Integrity: Goal: Risk for impaired skin integrity will decrease Outcome: Progressing   Problem: Education: Goal: Ability to describe self-care measures that may prevent or decrease complications (Diabetes Survival Skills Education) will improve Outcome: Progressing Goal: Individualized Educational Video(s) Outcome: Progressing   Problem: Coping: Goal: Ability to adjust to condition or change in health will improve Outcome: Progressing   Problem: Fluid Volume: Goal: Ability to  maintain a balanced intake and output will improve Outcome: Progressing   Problem: Health Behavior/Discharge Planning: Goal: Ability to identify and utilize available resources and services will improve Outcome: Progressing Goal: Ability to manage health-related needs will improve Outcome: Progressing   Problem: Metabolic: Goal: Ability to maintain appropriate glucose levels will improve Outcome: Progressing   Problem: Nutritional: Goal: Maintenance of adequate nutrition will improve Outcome: Progressing Goal: Progress toward achieving an optimal weight will improve Outcome: Progressing   Problem: Skin Integrity: Goal: Risk for impaired skin integrity will decrease Outcome: Progressing   Problem: Tissue Perfusion: Goal: Adequacy of tissue perfusion will improve Outcome: Progressing   Problem: Education: Goal: Knowledge of General Education information will improve Description: Including pain rating scale, medication(s)/side effects and non-pharmacologic comfort measures Outcome: Progressing   Problem: Health Behavior/Discharge Planning: Goal: Ability to manage health-related needs will improve Outcome: Progressing   Problem: Clinical Measurements: Goal: Ability to maintain clinical measurements within normal limits will improve Outcome: Progressing Goal: Will remain free from infection Outcome: Progressing Goal: Diagnostic test results will improve Outcome: Progressing Goal: Respiratory complications will improve Outcome: Progressing Goal: Cardiovascular complication will be avoided Outcome: Progressing   Problem: Activity: Goal: Risk for activity intolerance will decrease Outcome: Progressing   Problem: Nutrition: Goal: Adequate nutrition will be maintained Outcome: Progressing   Problem: Coping: Goal: Level of anxiety will decrease Outcome: Progressing   Problem: Elimination: Goal: Will not experience complications related to bowel motility Outcome:  Progressing Goal: Will not experience complications related to urinary retention Outcome: Progressing   Problem: Pain Management: Goal: General experience of comfort will improve Outcome: Progressing   Problem: Safety: Goal: Ability to remain free from injury will improve Outcome: Progressing   Problem: Skin Integrity: Goal: Risk for impaired skin integrity will decrease Outcome: Progressing   Problem: Education: Goal: Ability to describe self-care measures that may prevent or decrease complications (Diabetes Survival Skills Education) will improve Outcome: Progressing Goal: Individualized Educational Video(s) Outcome: Progressing   Problem: Coping: Goal: Ability to adjust to condition or change  in health will improve Outcome: Progressing   Problem: Fluid Volume: Goal: Ability to maintain a balanced intake and output will improve Outcome: Progressing   Problem: Health Behavior/Discharge Planning: Goal: Ability to identify and utilize available resources and services will improve Outcome: Progressing Goal: Ability to manage health-related needs will improve Outcome: Progressing   Problem: Metabolic: Goal: Ability to maintain appropriate glucose levels will improve Outcome: Progressing   Problem: Nutritional: Goal: Maintenance of adequate nutrition will improve Outcome: Progressing Goal: Progress toward achieving an optimal weight will improve Outcome: Progressing   Problem: Skin Integrity: Goal: Risk for impaired skin integrity will decrease Outcome: Progressing   Problem: Tissue Perfusion: Goal: Adequacy of tissue perfusion will improve Outcome: Progressing

## 2023-05-25 NOTE — Progress Notes (Addendum)
[facility] Progress Note  Subjective: seen in room, no c/o's.   Vitals:   05/24/23 2300 05/25/23 0100 05/25/23 0358 05/25/23 1042  BP: 95/68 111/67  117/88  Pulse: 84 66  72  Resp: 20 20 20 18   Temp: 98.4 F (36.9 C)  98.7 F (37.1 C) 98.6 F (37 C)  TempSrc: Oral  Oral Oral  SpO2: 94% 100%  95%  Weight:        Exam: Gen alert, no distress No jvd or bruits Chest clear bilat to bases RRR no MRG Abd soft ntnd no mass or ascites +bs Ext no LE edema Neuro is alert, Ox 3 , nf    LUA AVF+bruit / RIJ TDC      Renal-related home meds: - eliuqis 5 bid - cardizem cd 180 every day - metoprolol xl 12.5 every day - midodrine 10mg  pre hd tts      OP HD: TTS [facility]  - no answer, get records monday     Assessment/ Plan: Atrial fib w/ RVR - per pmd/ cardiology [facility] - on HD TTS. HD here yesterday. Next HD 11/5. Marland Kitchen  HTN/ BP - BP's wnl today. Gets midodrine pre HD 10mg . Cont home meds.  Volume - no sig vol excess on exam. Plan UF goal 2 L.  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    Vinson Moselle MD  [facility] 05/25/2023, 1:32 PM  Recent Labs  Lab 05/23/23 1138 05/23/23 1905 05/24/23 1400 05/25/23 0359  HGB 11.3*  --  9.5* 11.5*  ALBUMIN 3.5  --  3.0*  --   CALCIUM 6.8*  --  6.5* 7.4*  PHOS  --    < > 4.7* 4.6  CREATININE 7.37*  --  8.11* 5.80*  K 3.9  --  3.5 3.6   < > = values in this interval not displayed.   No results for input(s): "IRON", "TIBC", "FERRITIN" in the last 168 hours. Inpatient medications:  amiodarone  200 mg Oral BID   apixaban  5 mg Oral BID   Chlorhexidine Gluconate Cloth  6 each Topical Daily   glipiZIDE  2.5 mg Oral QAC breakfast   insulin aspart  0-5 Units Subcutaneous QHS   insulin aspart  0-6 Units Subcutaneous TID WC   midodrine  10 mg Oral Q T,Th,Sa-HD   pantoprazole  40 mg Oral Daily     dicyclomine, metoprolol tartrate, ondansetron, mouth rinse

## 2023-05-25 NOTE — Discharge Summary (Signed)
Physician Discharge Summary  [name] NWG:[id number] DOB: Dec 13, 1950 DOA: 05/23/2023  PCP: Hattie Perch, FNP  Admit date: 05/23/2023 Discharge date: 05/25/2023  Time spent: 40 minutes  Recommendations for Outpatient Follow-up:  Follow outpatient CBC/CMP  Follow afib outpatient on amio Follow intractable hiccups, started on PPI  Discharge Diagnoses:  Principal Problem:   Atrial fibrillation with tachycardic ventricular rate (HCC) Active Problems:   Ventral hernia   T2DM (type 2 diabetes mellitus) (HCC)   Hypertension   H/O: CVA (cerebrovascular accident)   ESRD (end stage renal disease) (HCC)   Hyperlipidemia   Hiccoughs   Atrial fibrillation with RVR (HCC)   Discharge Condition: stable  Diet recommendation: heart healthy  Filed Weights   05/24/23 1345 05/24/23 1716  Weight: 93.1 kg 90.1 kg    History of present illness:   [name] is [name] 72 y.o. Spanish-speaking male with history h/o hypertension, hyperlipidemia, DM, paroxysmal Rudy Domek-fib, CVA, thoracic aortic aneurysm, ESRD, recently initiated on HD TTS brought in by daughter and concern for recurrent episode of palpitations/Abdishakur Gottschall-fib with RVR.  Per daughter, patient usually takes both metoprolol and Cardizem for atrial fibrillation rate control.  Since initiated on dialysis patient has had episodes of palpitations that have been mostly occurring on the night of his dialysis session.  He has had ED visits and hospitalizations for this.  Past weekend, patient had episode of nausea and diarrhea for which she was seen at [facility] in [place] and told to have Jvon Meroney stomach virus.  Patient did recover within Meldrick Buttery day in terms of diarrhea but has had poor eating (only about 25% per daughter).  Past Tuesday patient underwent his scheduled HD but had to be aborted after 2 hours due to Legrand Lasser-fib with RVR and hypotension and sent to [facility].  Patient was given metoprolol additional dose and he apparently converted to sinus  rhythm.  At that time he was advised to take metoprolol prior to dialysis in order to avoid postdialysis nocturnal palpitations.  He was also prescribed midodrine for blood pressure tolerance to be taken along with beta-blocker.  Subsequently, he underwent dialysis on Thursday and did take metoprolol/midodrine prior to the session.  However later in the night (last night), experienced palpitations and presented to the ED again.    He was admitted with atrial fibrillation with RVR.  He's improved after amiodarone.  See below for additional details.  Hospital Course:  Assessment and Plan:  1.  Recurrent atrial fibrillation with RVR:  Will discharge with amiodarone 200 mg BID x 21 days followed by 200 mg daily Eliquis Will need outpt cards follow up    2.  Intractable hiccups: LFTs appear to be within normal limits.  BUN somewhat elevated but not significant enough to explain causing hiccups.  Chest x-ray showed bibasilar atelectasis but no pneumonia.  Will obtain liver ultrasound (no acute abnormality of liver or gallbladder).  PPI. Follow outpatient if persistent   Thoracic Aortic Aneurysm CTA without evidence of TAA or dissection, follow outpatient   3.  History of CVA: In 12/2022 showing left caudate nucleus and corona radiata infarct.  Resume home medications including Eliquis.   4.  Diabetes mellitus: resume home regimen   5.  ESRD:  Dialysis per renal    6. Hyperlipidemia: Not on any antihyperlipidemics per home med list.  LDL 58.    Procedures: none   Consultations: Cardiology renal  Discharge Exam: Vitals:   05/25/23 0358 05/25/23 1042  BP:  117/88  Pulse:  72  Resp: 20   Temp: 98.7 F (37.1 C)   SpO2:  95%   Eager to discharge  General: No acute distress. Cardiovascular: RRR Lungs: Clear to auscultation bilaterally  Abdomen: Soft, nontender, nondistended  Neurological: Alert and oriented 3. Moves all extremities 4 with equal strength. Cranial nerves II  through XII grossly intact. Extremities: No clubbing or cyanosis. No edema.   Discharge Instructions   Discharge Instructions     (HEART FAILURE PATIENTS) Call MD:  Anytime you have any of the following symptoms: 1) 3 pound weight gain in 24 hours or 5 pounds in 1 week 2) shortness of breath, with or without Kail Fraley dry hacking cough 3) swelling in the hands, feet or stomach 4) if you have to sleep on extra pillows at night in order to breathe.   Complete by: As directed    Call MD for:  difficulty breathing, headache or visual disturbances   Complete by: As directed    Call MD for:  extreme fatigue   Complete by: As directed    Call MD for:  hives   Complete by: As directed    Call MD for:  persistant dizziness or light-headedness   Complete by: As directed    Call MD for:  persistant nausea and vomiting   Complete by: As directed    Call MD for:  redness, tenderness, or signs of infection (pain, swelling, redness, odor or green/yellow discharge around incision site)   Complete by: As directed    Call MD for:  severe uncontrolled pain   Complete by: As directed    Call MD for:  temperature >100.4   Complete by: As directed    Diet - low sodium heart healthy   Complete by: As directed    Discharge instructions   Complete by: As directed    You were seen for atrial fibrillation with Rhilynn Preyer fast heart rate.    You've improved on amiodarone.  We'll send you home with amiodarone to take twice Zan Triska day for 21 days, then transition to once daily amiodarone (200 mg daily).   You should follow with your cardiologists as an outpatient.  Your hiccups seem to have improved.  We'll continue protonix for you.  If you have worsening or persistent hiccups, please follow up with your PCP to address and work this up further.  You had Maximilien Hayashi CT scan of your chest that didn't show Romie Tay thoracic aneurysm.  Follow up with your PCP and cardiologist outpatient.   Return for new, recurrent, or worsening  symptoms.  Please ask your PCP to request records from this hospitalization so they know what was done and what the next steps will be.   Increase activity slowly   Complete by: As directed       Allergies as of 05/25/2023   No Known Allergies      Medication List     STOP taking these medications    diltiazem 180 MG 24 hr capsule Commonly known as: CARDIZEM CD   metoprolol succinate 25 MG 24 hr tablet Commonly known as: TOPROL-XL       TAKE these medications    amiodarone 200 MG tablet Commonly known as: PACERONE Take 1 tablet (200 mg total) by mouth 2 (two) times daily for 21 days, THEN 1 tablet (200 mg total) daily. Follow with cardiology for refills. Start taking on: May 25, 2023   dicyclomine 20 MG tablet Commonly known as: BENTYL Take 20 mg by mouth every 4 (  four) hours as needed (Abdominal cramping/pain).   Eliquis 5 MG Tabs tablet Generic drug: apixaban Take 1 tablet (5 mg total) by mouth 2 (two) times daily.   glipiZIDE 5 MG tablet Commonly known as: GLUCOTROL Take 1 tablet (5 mg total) by mouth daily before breakfast.   midodrine 10 MG tablet Commonly known as: PROAMATINE Take one tablet before dialysis to help keep your blood pressure up   ondansetron 4 MG disintegrating tablet Commonly known as: ZOFRAN-ODT Take 4 mg by mouth every 8 (eight) hours as needed for nausea or vomiting.   pantoprazole 40 MG tablet Commonly known as: PROTONIX Take 1 tablet (40 mg total) by mouth daily. Follow up with your PCP for additional workup if your hiccups continue Start taking on: May 26, 2023       No Known Allergies    The results of significant diagnostics from this hospitalization (including imaging, microbiology, ancillary and laboratory) are listed below for reference.    Significant Diagnostic Studies: CT ANGIO CHEST AORTA W/CM & OR WO/CM  Result Date: 05/25/2023 CLINICAL DATA:  Shortness of breath, palpitations EXAM: CT ANGIOGRAPHY  CHEST WITH CONTRAST TECHNIQUE: Multidetector CT imaging of the chest was performed using the standard protocol during bolus administration of intravenous contrast. Multiplanar CT image reconstructions and MIPs were obtained to evaluate the vascular anatomy. RADIATION DOSE REDUCTION: This exam was performed according to the departmental dose-optimization program which includes automated exposure control, adjustment of the mA and/or kV according to patient size and/or use of iterative reconstruction technique. CONTRAST:  75mL OMNIPAQUE IOHEXOL 350 MG/ML SOLN COMPARISON:  Chest radiograph dated 05/23/2023. CT chest dated 08/30/2010. FINDINGS: Cardiovascular: On unenhanced CT, there is no evidence of intramural hematoma. Following contrast administration, there is no evidence of thoracic aortic aneurysm or dissection. Although not tailored for evaluation of the pulmonary arteries, there is no evidence central pulmonary embolism. The heart is normal in size.  No pericardial effusion. Right IJ catheter terminates at the cavoatrial junction. Mediastinum/Nodes: No suspicious lymphadenopathy. Visualized thyroid is unremarkable. Lungs/Pleura: Mild lingular and left lower lobe scarring/atelectasis. No focal consolidation. Mild centrilobular emphysematous changes, upper lung predominant. No pleural effusion or pneumothorax. Upper Abdomen: Visualized upper abdomen is grossly unremarkable, noting vascular calcifications. Musculoskeletal: Degenerative changes of the visualized thoracolumbar spine. Review of the MIP images confirms the above findings. IMPRESSION: No evidence of thoracic aortic aneurysm or dissection. No evidence of central pulmonary embolism. No acute cardiopulmonary disease. Emphysema (ICD10-J43.9). Electronically Signed   By: Charline Bills M.D.   On: 05/25/2023 01:46   US Abdomen Limited RUQ (LIVER/GB)  Result Date: 05/24/2023 CLINICAL DATA:  Intractable hiccups EXAM: ULTRASOUND ABDOMEN LIMITED RIGHT  UPPER QUADRANT COMPARISON:  CT abdomen pelvis 11/21/2010 FINDINGS: Gallbladder: Gallstones: None Sludge: None Gallbladder Wall: Within normal limits Pericholecystic fluid: None Sonographic Murphy's Sign: Negative per technologist Common bile duct: Diameter: 3 mm Liver: Parenchymal echogenicity: Homogeneously increased Contours: Normal Lesions: None Portal vein: Patent.  Hepatopetal flow Other: None. IMPRESSION: 1. No acute abnormality of the liver or gallbladder. 2. Diffuse increased echogenicity of the hepatic parenchyma is Marquelle Balow nonspecific indicator of hepatocellular dysfunction, most commonly steatosis. Electronically Signed   By: Acquanetta Belling M.D.   On: 05/24/2023 11:23   DG Chest Port 1 View  Result Date: 05/23/2023 CLINICAL DATA:  Palpitations and shortness of breath EXAM: PORTABLE CHEST 1 VIEW COMPARISON:  05/20/2023. FINDINGS: Double-lumen right IJ catheter in place. Tip of the SVC right atrial junction. Stable cardiopericardial silhouette. Underinflation. Basilar atelectasis. No pneumothorax or edema.  No consolidation. The left inferior costophrenic angle is clipped off the edge of the film. IMPRESSION: Right IJ catheter. Underinflation with some basilar atelectasis. Electronically Signed   By: Karen Kays M.D.   On: 05/23/2023 13:25   DG Chest Port 1 View  Result Date: 05/20/2023 CLINICAL DATA:  72 year old patient with tachycardia and history of dialysis. EXAM: PORTABLE CHEST 1 VIEW COMPARISON:  Chest radiograph dated January 22, 2023. FINDINGS: The heart size is borderline enlarged. The mediastinal contours are within normal limits. Right IJ hemodialysis catheter tip at the level of the lower SVC. No focal consolidation. Streaky opacities at the left lung base may represent atelectasis or scarring. No pneumothorax or sizable pleural effusion. Overlapping telemetry wires. No acute osseous abnormality. IMPRESSION: No acute cardiopulmonary findings. Electronically Signed   By: Hart Robinsons M.D.   On:  05/20/2023 11:02   VAS US DUPLEX DIALYSIS ACCESS (AVF, AVG)  Result Date: 05/02/2023 DIALYSIS ACCESS Patient Name:  BEAU RAMSBURG  Date of Exam:   05/02/2023 Medical Rec #: [id number]              Accession #:    [id number] Date of Birth: 18-Oct-1950              Patient Gender: M Patient Age:   38 years Exam Location:  [facility] Procedure:      VAS US DUPLEX DIALYSIS ACCESS (AVF, AVG) Referring Phys: Lelon Mast RHYNE --------------------------------------------------------------------------------  Reason for Exam: Abnormal bruit/thrill whistle. Access Site: Left Upper Extremity. Access Type: Basilic vein transposition. Performing Technologist: Dorthula Matas RVS, RCS  Examination Guidelines: Andreina Outten complete evaluation includes B-mode imaging, spectral Doppler, color Doppler, and power Doppler as needed of all accessible portions of each vessel. Unilateral testing is considered an integral part of Lawsen Arnott complete examination. Limited examinations for reoccurring indications may be performed as noted.  Findings: +--------------------+----------+-----------------+--------+ AVF                 PSV (cm/s)Flow Vol (mL/min)Comments +--------------------+----------+-----------------+--------+ Native artery inflow   153          1059                +--------------------+----------+-----------------+--------+ AVF Anastomosis        637                              +--------------------+----------+-----------------+--------+  +------------+----------+-------------+----------+--------+ OUTFLOW VEINPSV (cm/s)Diameter (cm)Depth (cm)Describe +------------+----------+-------------+----------+--------+ Shoulder       110        0.81        0.34            +------------+----------+-------------+----------+--------+ Prox UA         97        0.76        0.19            +------------+----------+-------------+----------+--------+ Mid UA         156        0.70        0.25             +------------+----------+-------------+----------+--------+ Dist UA        497        0.48        0.75            +------------+----------+-------------+----------+--------+ AC Fossa       302        0.41  0.70            +------------+----------+-------------+----------+--------+   Summary: Patent arteriovenous fistula. No evidence stenosis. *See table(s) above for measurements and observations.  Diagnosing physician: Carolynn Sayers Electronically signed by Carolynn Sayers on 05/02/2023 at 12:17:41 PM.    --------------------------------------------------------------------------------   Final     Microbiology: No results found for this or any previous visit (from the past 240 hour(s)).   Labs: Basic Metabolic Panel: Recent Labs  Lab 05/20/23 0945 05/23/23 1138 05/23/23 1905 05/24/23 1400 05/25/23 0359  NA 134* 140  --  141 135  K 3.5 3.9  --  3.5 3.6  CL 96* 102  --  103 94*  CO2 24 26  --  26 27  GLUCOSE 145* 136*  --  183* 111*  BUN 46* 46*  --  60* 33*  CREATININE 6.05* 7.37*  --  8.11* 5.80*  CALCIUM 7.2* 6.8*  --  6.5* 7.4*  MG  --  1.9  --   --  1.8  PHOS  --   --  4.4 4.7* 4.6   Liver Function Tests: Recent Labs  Lab 05/20/23 0945 05/23/23 1138 05/24/23 1400  AST 18 15  --   ALT 13 14  --   ALKPHOS 90 92  --   BILITOT 0.5 0.6  --   PROT 7.4 7.1  --   ALBUMIN 3.6 3.5 3.0*   No results for input(s): "LIPASE", "AMYLASE" in the last 168 hours. No results for input(s): "AMMONIA" in the last 168 hours. CBC: Recent Labs  Lab 05/20/23 0945 05/23/23 1138 05/24/23 1400 05/25/23 0359  WBC 8.3 8.1 7.8 7.7  NEUTROABS 5.1 4.4 4.2  --   HGB 11.1* 11.3* 9.5* 11.5*  HCT 33.4* 34.6* 28.6* 33.7*  MCV 96.5 97.5 96.9 95.2  PLT 177 208 188 214   Cardiac Enzymes: No results for input(s): "CKTOTAL", "CKMB", "CKMBINDEX", "TROPONINI" in the last 168 hours. BNP: BNP (last 3 results) Recent Labs    01/22/23 0606  BNP 1,135.7*    ProBNP (last 3  results) No results for input(s): "PROBNP" in the last 8760 hours.  CBG: Recent Labs  Lab 05/24/23 1153 05/24/23 1756 05/24/23 2027 05/25/23 0359 05/25/23 0756  GLUCAP 207* 103* 279* 110* 115*       Signed:  Lacretia Nicks MD.  [facility] 05/25/2023, 11:06 AM

## 2023-05-27 LAB — HEPATITIS B SURFACE ANTIBODY, QUANTITATIVE: Hep B S AB Quant (Post): <3.5 m[IU]/mL — ABNORMAL LOW

## 2023-06-09 ENCOUNTER — Other Ambulatory Visit (HOSPITAL_COMMUNITY): Payer: Self-pay | Admitting: Nephrology

## 2023-06-09 DIAGNOSIS — N186 End stage renal disease: Secondary | ICD-10-CM

## 2023-06-12 ENCOUNTER — Ambulatory Visit (HOSPITAL_COMMUNITY)
Admission: RE | Admit: 2023-06-12 | Discharge: 2023-06-12 | Disposition: A | Discharge status: Home / Self Care | Payer: Self-pay | Source: Ambulatory Visit | Attending: Nephrology | Admitting: Nephrology

## 2023-06-12 DIAGNOSIS — Z452 Encounter for adjustment and management of vascular access device: Secondary | ICD-10-CM | POA: Insufficient documentation

## 2023-06-12 DIAGNOSIS — N186 End stage renal disease: Secondary | ICD-10-CM | POA: Insufficient documentation

## 2023-06-12 HISTORY — PX: IR REMOVAL TUN CV CATH W/O FL: IMG2289

## 2023-06-12 MED ORDER — CHLORHEXIDINE GLUCONATE 4 % EX SOLN
CUTANEOUS | Status: AC
  Filled 2023-06-12: qty 15

## 2023-06-12 MED ORDER — LIDOCAINE HCL 1 % IJ SOLN
20.0000 mL | Freq: Once | INTRAMUSCULAR | Status: AC
  Administered 2023-06-12: 6 mL via INTRADERMAL

## 2023-06-12 MED ORDER — LIDOCAINE HCL 1 % IJ SOLN
INTRAMUSCULAR | Status: AC
  Filled 2023-06-12: qty 20

## 2023-10-28 ENCOUNTER — Emergency Department (HOSPITAL_COMMUNITY)
Admission: EM | Admit: 2023-10-28 | Discharge: 2023-10-28 | Disposition: A | Discharge status: Home / Self Care | Payer: MEDICAID | Attending: Emergency Medicine | Admitting: Emergency Medicine

## 2023-10-28 ENCOUNTER — Telehealth: Payer: Self-pay

## 2023-10-28 ENCOUNTER — Other Ambulatory Visit: Payer: Self-pay

## 2023-10-28 ENCOUNTER — Encounter (HOSPITAL_COMMUNITY): Payer: Self-pay

## 2023-10-28 ENCOUNTER — Telehealth (HOSPITAL_COMMUNITY): Payer: Self-pay | Admitting: *Deleted

## 2023-10-28 DIAGNOSIS — D649 Anemia, unspecified: Secondary | ICD-10-CM | POA: Insufficient documentation

## 2023-10-28 DIAGNOSIS — Z7901 Long term (current) use of anticoagulants: Secondary | ICD-10-CM | POA: Insufficient documentation

## 2023-10-28 DIAGNOSIS — T82838A Hemorrhage of vascular prosthetic devices, implants and grafts, initial encounter: Secondary | ICD-10-CM | POA: Insufficient documentation

## 2023-10-28 DIAGNOSIS — T829XXA Unspecified complication of cardiac and vascular prosthetic device, implant and graft, initial encounter: Secondary | ICD-10-CM

## 2023-10-28 DIAGNOSIS — Z7984 Long term (current) use of oral hypoglycemic drugs: Secondary | ICD-10-CM | POA: Insufficient documentation

## 2023-10-28 DIAGNOSIS — Z992 Dependence on renal dialysis: Secondary | ICD-10-CM | POA: Insufficient documentation

## 2023-10-28 LAB — CBC WITH DIFFERENTIAL/PLATELET
Abs Immature Granulocytes: 0.03 10*3/uL (ref 0.00–0.07)
Basophils Absolute: 0.1 10*3/uL (ref 0.0–0.1)
Basophils Relative: 1 %
Eosinophils Absolute: 0.1 10*3/uL (ref 0.0–0.5)
Eosinophils Relative: 2 %
HCT: 38 % — ABNORMAL LOW (ref 39.0–52.0)
Hemoglobin: 12.5 g/dL — ABNORMAL LOW (ref 13.0–17.0)
Immature Granulocytes: 1 %
Lymphocytes Relative: 32 %
Lymphs Abs: 1.7 10*3/uL (ref 0.7–4.0)
MCH: 32.2 pg (ref 26.0–34.0)
MCHC: 32.9 g/dL (ref 30.0–36.0)
MCV: 97.9 fL (ref 80.0–100.0)
Monocytes Absolute: 0.4 10*3/uL (ref 0.1–1.0)
Monocytes Relative: 8 %
Neutro Abs: 3.1 10*3/uL (ref 1.7–7.7)
Neutrophils Relative %: 56 %
Platelets: 205 10*3/uL (ref 150–400)
RBC: 3.88 MIL/uL — ABNORMAL LOW (ref 4.22–5.81)
RDW: 12.5 % (ref 11.5–15.5)
WBC: 5.4 10*3/uL (ref 4.0–10.5)
nRBC: 0 % (ref 0.0–0.2)

## 2023-10-28 LAB — COMPREHENSIVE METABOLIC PANEL WITH GFR
ALT: 20 U/L (ref 0–44)
AST: 18 U/L (ref 15–41)
Albumin: 3.9 g/dL (ref 3.5–5.0)
Alkaline Phosphatase: 102 U/L (ref 38–126)
Anion gap: 13 (ref 5–15)
BUN: 21 mg/dL (ref 8–23)
CO2: 29 mmol/L (ref 22–32)
Calcium: 8.7 mg/dL — ABNORMAL LOW (ref 8.9–10.3)
Chloride: 97 mmol/L — ABNORMAL LOW (ref 98–111)
Creatinine, Ser: 3.56 mg/dL — ABNORMAL HIGH (ref 0.61–1.24)
GFR, Estimated: 17 mL/min — ABNORMAL LOW (ref >60)
Glucose, Bld: 87 mg/dL (ref 70–99)
Potassium: 3.9 mmol/L (ref 3.5–5.1)
Sodium: 139 mmol/L (ref 135–145)
Total Bilirubin: 0.5 mg/dL (ref 0.0–1.2)
Total Protein: 8.1 g/dL (ref 6.5–8.1)

## 2023-10-28 LAB — TYPE AND SCREEN
ABO/RH(D): B POS
Antibody Screen: NEGATIVE

## 2023-10-28 NOTE — Telephone Encounter (Signed)
 Called pt's daughter to schedule fistulogram from referral received. Pt's daughter states he is currently leaving HD and on his way to the ED due to active bleeding from AVF. She thinks they will proceed with fistulogram but will call us back if they need to schedule this.

## 2023-10-28 NOTE — Telephone Encounter (Signed)
 Received fax from Dr Cherylann Ratel requesting fistulogram due to prolonged bleeding.  Will notify Marchelle Folks and scan fax into media.

## 2023-10-28 NOTE — Discharge Instructions (Addendum)
 Plan to get the fistulogram study scheduled as recommended by your dialysis providers.

## 2023-10-28 NOTE — ED Provider Notes (Signed)
 [place] EMERGENCY DEPARTMENT AT [facility] Provider Note   CSN: [id number] Arrival date & time: 10/28/23  1315     History  Chief Complaint  Patient presents with   Vascular Access Problem    [name] is a 73 y.o. male presenting from dialysis with continued bleeding from his fistula in his left upper arm.  He has had several complications recently with this graft and his providers at dialysis are scheduling him for a fistulogram study determine if he needs revision of this graft.  He completed dialysis around 11, he presents here with a clamp in place which appears to be controlling the bleeding at this time.  He denies any complaints of pain, dizziness or weakness.  The history is provided by the patient and a relative.       Home Medications Prior to Admission medications   Medication Sig Start Date End Date Taking? Authorizing Provider  amiodarone (PACERONE) 200 MG tablet Take 1 tablet (200 mg total) by mouth 2 (two) times daily for 21 days, THEN 1 tablet (200 mg total) daily. Follow with cardiology for refills. 05/25/23 07/15/23  Zigmund Daniel., MD  apixaban (ELIQUIS) 5 MG TABS tablet Take 1 tablet (5 mg total) by mouth 2 (two) times daily. 02/03/23   Noralee Stain, DO  dicyclomine (BENTYL) 20 MG tablet Take 20 mg by mouth every 4 (four) hours as needed (Abdominal cramping/pain). 05/18/23 06/17/23  [provider]  glipiZIDE (GLUCOTROL) 5 MG tablet Take 1 tablet (5 mg total) by mouth daily before breakfast. 02/03/23   Noralee Stain, DO  midodrine (PROAMATINE) 10 MG tablet Take one tablet before dialysis to help keep your blood pressure up 05/20/23   Elson Areas, PA-C  pantoprazole (PROTONIX) 40 MG tablet Take 1 tablet (40 mg total) by mouth daily. Follow up with your PCP for additional workup if your hiccups continue 05/26/23 07/25/23  Zigmund Daniel., MD      Allergies    Patient has no known allergies.    Review of Systems    Review of Systems  Constitutional:  Negative for fever.  HENT:  Negative for congestion and sore throat.   Eyes: Negative.   Respiratory:  Negative for chest tightness and shortness of breath.   Cardiovascular:  Negative for chest pain.  Gastrointestinal:  Negative for abdominal pain and nausea.  Genitourinary: Negative.   Musculoskeletal:  Negative for arthralgias, joint swelling and neck pain.  Skin: Negative.  Negative for rash and wound.  Neurological:  Negative for dizziness, weakness, light-headedness, numbness and headaches.  Psychiatric/Behavioral: Negative.      Physical Exam Updated Vital Signs BP (!) 163/87 (BP Location: Right Arm)   Pulse 64   Temp 97.9 F (36.6 C) (Oral)   Resp 17   Ht 5\' 7"  (1.702 m)   Wt 90.7 kg   SpO2 94%   BMI 31.32 kg/m  Physical Exam Vitals and nursing note reviewed.  Constitutional:      Appearance: He is well-developed.  HENT:     Head: Normocephalic and atraumatic.  Eyes:     Conjunctiva/sclera: Conjunctivae normal.  Cardiovascular:     Rate and Rhythm: Normal rate and regular rhythm.     Heart sounds: Normal heart sounds.     Comments: Clamp over fistula left upper arm,  no active bleeding.  Pulmonary:     Effort: Pulmonary effort is normal.  Musculoskeletal:     Cervical back: Normal range of motion.  Skin:    General: Skin is warm and dry.  Neurological:     Mental Status: He is alert.     ED Results / Procedures / Treatments   Labs (all labs ordered are listed, but only abnormal results are displayed) Labs Reviewed  CBC WITH DIFFERENTIAL/PLATELET - Abnormal; Notable for the following components:      Result Value   RBC 3.88 (*)    Hemoglobin 12.5 (*)    HCT 38.0 (*)    All other components within normal limits  COMPREHENSIVE METABOLIC PANEL WITH GFR - Abnormal; Notable for the following components:   Chloride 97 (*)    Creatinine, Ser 3.56 (*)    Calcium 8.7 (*)    GFR, Estimated 17 (*)    All other  components within normal limits  TYPE AND SCREEN    EKG None  Radiology No results found.  Procedures Procedures    Medications Ordered in ED Medications - No data to display  ED Course/ Medical Decision Making/ A&P                                 Medical Decision Making Patient presenting with a bleeding fistula, clamp was applied while awaiting evaluation and at time of evaluation there was no further bleeding from the fistula site.  Dressing was applied.  Review of chart indicates that Dr. Lourdes Sledge has ordered a fistulogram, patient and family member at bedside advised that they need to have this scheduled for him, they are aware.  Amount and/or Complexity of Data Reviewed Labs: ordered.    Details: C-Met and CBC reviewed, hemoglobin is 12.5, creatinine is 3.56.  These numbers are stable.           Final Clinical Impression(s) / ED Diagnoses Final diagnoses:  Complication of vascular access for dialysis, initial encounter    Rx / DC Orders ED Discharge Orders     None         Victoriano Lain 10/28/23 1651    Gloris Manchester, MD 10/29/23 [phone]

## 2023-10-28 NOTE — ED Notes (Signed)
 ED Provider at bedside.

## 2023-10-28 NOTE — ED Triage Notes (Signed)
 Pt arrives via [facility] from [place] [facility] where he received full treatment but staff was unable to get fistula to stop bleeding. Clamp in place and bleeding appears to be controlled at this time. Pt is free from complaints, alert, and sitting up in stretcher.

## 2023-10-29 ENCOUNTER — Other Ambulatory Visit: Payer: Self-pay

## 2023-10-29 DIAGNOSIS — N186 End stage renal disease: Secondary | ICD-10-CM

## 2023-10-29 MED ORDER — SODIUM CHLORIDE 0.9% FLUSH
3.0000 mL | INTRAVENOUS | Status: AC | PRN
Start: 2023-10-29 — End: ?

## 2023-10-29 MED ORDER — SODIUM CHLORIDE 0.9% FLUSH: 3.0000 mL | Freq: Two times a day (BID) | INTRAVENOUS | Status: AC

## 2023-10-29 MED ORDER — SODIUM CHLORIDE 0.9 % IV SOLN
250.0000 mL | INTRAVENOUS | Status: AC | PRN
Start: 2023-10-29 — End: 2023-10-30

## 2023-11-05 ENCOUNTER — Encounter (HOSPITAL_COMMUNITY)
Admission: RE | Disposition: A | Discharge status: Home / Self Care | Payer: Self-pay | Source: Home / Self Care | Attending: Vascular Surgery

## 2023-11-05 ENCOUNTER — Ambulatory Visit (HOSPITAL_COMMUNITY)
Admission: RE | Admit: 2023-11-05 | Discharge: 2023-11-05 | Disposition: A | Discharge status: Home / Self Care | Payer: Self-pay | Attending: Vascular Surgery | Admitting: Vascular Surgery

## 2023-11-05 ENCOUNTER — Other Ambulatory Visit: Payer: Self-pay

## 2023-11-05 DIAGNOSIS — E1122 Type 2 diabetes mellitus with diabetic chronic kidney disease: Secondary | ICD-10-CM | POA: Insufficient documentation

## 2023-11-05 DIAGNOSIS — Z79899 Other long term (current) drug therapy: Secondary | ICD-10-CM | POA: Insufficient documentation

## 2023-11-05 DIAGNOSIS — Y832 Surgical operation with anastomosis, bypass or graft as the cause of abnormal reaction of the patient, or of later complication, without mention of misadventure at the time of the procedure: Secondary | ICD-10-CM | POA: Insufficient documentation

## 2023-11-05 DIAGNOSIS — Z7984 Long term (current) use of oral hypoglycemic drugs: Secondary | ICD-10-CM | POA: Insufficient documentation

## 2023-11-05 DIAGNOSIS — Z992 Dependence on renal dialysis: Secondary | ICD-10-CM | POA: Insufficient documentation

## 2023-11-05 DIAGNOSIS — N185 Chronic kidney disease, stage 5: Secondary | ICD-10-CM

## 2023-11-05 DIAGNOSIS — N186 End stage renal disease: Secondary | ICD-10-CM | POA: Insufficient documentation

## 2023-11-05 DIAGNOSIS — T82858A Stenosis of vascular prosthetic devices, implants and grafts, initial encounter: Secondary | ICD-10-CM | POA: Insufficient documentation

## 2023-11-05 DIAGNOSIS — T82898A Other specified complication of vascular prosthetic devices, implants and grafts, initial encounter: Secondary | ICD-10-CM

## 2023-11-05 DIAGNOSIS — I120 Hypertensive chronic kidney disease with stage 5 chronic kidney disease or end stage renal disease: Secondary | ICD-10-CM | POA: Insufficient documentation

## 2023-11-05 LAB — POCT I-STAT, CHEM 8
BUN: 40 mg/dL — ABNORMAL HIGH (ref 8–23)
Calcium, Ion: 0.99 mmol/L — ABNORMAL LOW (ref 1.15–1.40)
Chloride: 103 mmol/L (ref 98–111)
Creatinine, Ser: 5.4 mg/dL — ABNORMAL HIGH (ref 0.61–1.24)
Glucose, Bld: 105 mg/dL — ABNORMAL HIGH (ref 70–99)
HCT: 33 % — ABNORMAL LOW (ref 39.0–52.0)
Hemoglobin: 11.2 g/dL — ABNORMAL LOW (ref 13.0–17.0)
Potassium: 4.3 mmol/L (ref 3.5–5.1)
Sodium: 141 mmol/L (ref 135–145)
TCO2: 32 mmol/L (ref 22–32)

## 2023-11-05 LAB — GLUCOSE, CAPILLARY: Glucose-Capillary: 99 mg/dL (ref 70–99)

## 2023-11-05 SURGERY — A/V FISTULAGRAM
Anesthesia: LOCAL | Laterality: Left

## 2023-11-05 MED ORDER — HEPARIN (PORCINE) IN NACL 1000-0.9 UT/500ML-% IV SOLN
INTRAVENOUS | Status: DC | PRN
  Administered 2023-11-05: 500 mL

## 2023-11-05 MED ORDER — LIDOCAINE HCL (PF) 1 % IJ SOLN
INTRAMUSCULAR | Status: AC
Start: 2023-11-05 — End: ?
  Filled 2023-11-05: qty 30

## 2023-11-05 MED ORDER — HEPARIN SODIUM (PORCINE) 1000 UNIT/ML IJ SOLN
INTRAMUSCULAR | Status: DC | PRN
  Administered 2023-11-05: 3000 [IU] via INTRAVENOUS

## 2023-11-05 MED ORDER — IODIXANOL 320 MG/ML IV SOLN
INTRAVENOUS | Status: DC | PRN
  Administered 2023-11-05: 30 mL

## 2023-11-05 MED ORDER — LIDOCAINE HCL (PF) 1 % IJ SOLN
INTRAMUSCULAR | Status: DC | PRN
  Administered 2023-11-05: 5 mL via INTRADERMAL

## 2023-11-05 SURGICAL SUPPLY — 13 items
BAG SNAP BAND KOVER 36X36 (MISCELLANEOUS) ×2 IMPLANT
BALLN LUTONIX AV 8X40X75 (BALLOONS) ×2 IMPLANT
BALLOON LUTONIX AV 8X40X75 (BALLOONS) IMPLANT
COVER DOME SNAP 22 D (MISCELLANEOUS) ×2 IMPLANT
KIT MICROPUNCTURE NIT STIFF (SHEATH) IMPLANT
PROTECTION STATION PRESSURIZED (MISCELLANEOUS) ×2 IMPLANT
SHEATH PINNACLE R/O II 6F 4CM (SHEATH) IMPLANT
SHEATH PROBE COVER 6X72 (BAG) ×2 IMPLANT
STATION PROTECTION PRESSURIZED (MISCELLANEOUS) ×2 IMPLANT
STOPCOCK MORSE 400PSI 3WAY (MISCELLANEOUS) ×2 IMPLANT
TRAY PV CATH (CUSTOM PROCEDURE TRAY) ×2 IMPLANT
TUBING CIL FLEX 10 FLL-RA (TUBING) ×2 IMPLANT
WIRE BENTSON .035X145CM (WIRE) IMPLANT

## 2023-11-05 NOTE — Op Note (Signed)
    Patient name: [name] MRN: [id number] DOB: 06/27/1951 Sex: male  11/05/2023 Pre-operative Diagnosis: Left arm fistula malfunction Post-operative diagnosis:  Same Surgeon:  Kayla Part, MD Procedure Performed: 1.  Fistulogram 2.  Balloon venoplasty left brachiocephalic fistula at the proximal humerus 8 x 40 drug-coated balloon   Indications: Patient is a 73 year old male with end-stage renal disease currently dialyzed through a left sided brachiocephalic fistula.  Unfortunately, he has had prolonged bleeding after dialysis.  After discussing the risks and benefits of left arm fistulogram in an effort to improve flow, the patient elected to proceed.  Findings:   Focal, greater than 70% stenosis of the brachiocephalic fistula at the proximal humerus, otherwise widely patent No anastomotic stenosis   Procedure:  The patient was identified in the holding area and taken to room 8.  The patient was then placed supine on the table and prepped and draped in the usual sterile fashion.  A time out was called.  The left arm brachiocephalic fistula was accessed 3 cm from the antecubital fossa using a micropuncture needle.  A micropuncture sheath was placed and fistulogram followed.  See results above.  I have elected to intervene on the flow-limiting stenosis appreciated at the proximal humerus.  A short 6 French sheath was placed.  The patient was heparinized, and an 8 x 40 mm drug-coated balloon was brought into the field and positioned across the lesion.  This was inflated for 3 minutes, with follow-up angiography demonstrated Result with resolution of lumen stenosis.  Access site was closed using Monocryl suture.    Kayla Part MD [facility] Office: [phone]

## 2023-11-05 NOTE — H&P (Signed)
 Hospital Consult    Reason for Consult: Fistula malfunction Requesting Physician: Outpatient nephrology MRN #:  [id number]  History of Present Illness: This is a 73 y.o. male with end-stage renal disease and prolonged bleeding after dialysis.  He was referred for fistulogram.  Past Medical History:  Diagnosis Date   Diabetes mellitus    ESRD on hemodialysis (HCC)    HLD (hyperlipidemia)    Hypertension     Past Surgical History:  Procedure Laterality Date   APPENDECTOMY     ruptured appendix 13 years ago did not close wound   AV FISTULA PLACEMENT Left 01/28/2023   Procedure: LEFT ARM BRACHIO BASILIC ARTERIOVENOUS (AV) FISTULA CREATION;  Surgeon: Victorino Sparrow, MD;  Location: [facility] OR;  Service: Vascular;  Laterality: Left;   BASCILIC VEIN TRANSPOSITION Left 04/14/2023   Procedure: LEFT ARM SECOND STAGE BASILIC VEIN TRANSPOSITION;  Surgeon: Victorino Sparrow, MD;  Location: [facility] OR;  Service: Vascular;  Laterality: Left;  with Regional Block   HERNIA REPAIR     11 yrs ago    IR FLUORO GUIDE CV LINE RIGHT  01/24/2023   IR FLUORO GUIDE CV LINE RIGHT  03/03/2023   IR REMOVAL TUN CV CATH W/O FL  06/12/2023   IR US GUIDE VASC ACCESS RIGHT  01/24/2023   OPEN REDUCTION INTERNAL FIXATION (ORIF) DISTAL RADIAL FRACTURE Left 09/03/2019   Procedure: OPEN REDUCTION INTERNAL FIXATION (ORIF) DISTAL RADIAL FRACTURE;  Surgeon: Mack Hook, MD;  Location: [facility];  Service: Orthopedics;  Laterality: Left;    No Known Allergies  Prior to Admission medications   Medication Sig Start Date End Date Taking? Authorizing Provider  amiodarone (PACERONE) 200 MG tablet Take 1 tablet (200 mg total) by mouth 2 (two) times daily for 21 days, THEN 1 tablet (200 mg total) daily. Follow with cardiology for refills. Patient taking differently: 1 tablet (200 mg total) daily. Follow with cardiology for refills. 05/25/23 11/05/23 Yes Zigmund Daniel., MD  amLODipine (NORVASC) 5 MG tablet Take 5 mg  by mouth daily. 10/31/23  Yes [provider]  apixaban (ELIQUIS) 5 MG TABS tablet Take 1 tablet (5 mg total) by mouth 2 (two) times daily. 02/03/23  Yes Noralee Stain, DO  glipiZIDE (GLUCOTROL) 5 MG tablet Take 1 tablet (5 mg total) by mouth daily before breakfast. 02/03/23  Yes Noralee Stain, DO  midodrine (PROAMATINE) 10 MG tablet Take one tablet before dialysis to help keep your blood pressure up 05/20/23   Elson Areas, PA-C    Social History   Socioeconomic History   Marital status: Married    Spouse name: Not on file   Number of children: 5   Years of education: Not on file   Highest education level: Not on file  Occupational History    Employer: UNEMPLOYED  Tobacco Use   Smoking status: Never   Smokeless tobacco: Never  Vaping Use   Vaping status: Never Used  Substance and Sexual Activity   Alcohol use: No   Drug use: No   Sexual activity: Not on file  Other Topics Concern   Not on file  Social History Narrative   Not on file   Social Drivers of Health   Financial Resource Strain: Low Risk  (10/18/2020)   Received from [facility], [facility]   Overall Financial Resource Strain (CARDIA)    Difficulty of Paying Living Expenses: Not hard at all  Food Insecurity: No Food Insecurity (05/23/2023)   Hunger Vital  Sign    Worried About Programme researcher, broadcasting/film/video in the Last Year: Never true    Ran Out of Food in the Last Year: Never true  Transportation Needs: No Transportation Needs (05/23/2023)   PRAPARE - Administrator, Civil Service (Medical): No    Lack of Transportation (Non-Medical): No  Physical Activity: Not on file  Stress: Not on file  Social Connections: Unknown (01/16/2023)   Received from [facility]   Social Network    Social Network: Not on file  Intimate Partner Violence: Not At Risk (05/23/2023)   Humiliation, Afraid, Rape, and Kick questionnaire    Fear of Current or Ex-Partner: No    Emotionally Abused: No    Physically  Abused: No    Sexually Abused: No   Family History  Problem Relation Age of Onset   Heart attack Neg Hx     ROS: Otherwise negative unless mentioned in HPI  Physical Examination  Vitals:   11/05/23 0911 11/05/23 0928  BP: (!) 169/83 (!) 147/89  Pulse:  75  Resp:    Temp:    SpO2:  96%   Body mass index is 28.41 kg/m.  Pulsatility in the left brachiocephalic fistula Nonlabored breathing Regular rate   CBC    Component Value Date/Time   WBC 5.4 10/28/2023 1411   RBC 3.88 (L) 10/28/2023 1411   HGB 11.2 (L) 11/05/2023 0702   HCT 33.0 (L) 11/05/2023 0702   PLT 205 10/28/2023 1411   MCV 97.9 10/28/2023 1411   MCH 32.2 10/28/2023 1411   MCHC 32.9 10/28/2023 1411   RDW 12.5 10/28/2023 1411   LYMPHSABS 1.7 10/28/2023 1411   MONOABS 0.4 10/28/2023 1411   EOSABS 0.1 10/28/2023 1411   BASOSABS 0.1 10/28/2023 1411    BMET    Component Value Date/Time   NA 141 11/05/2023 0702   K 4.3 11/05/2023 0702   CL 103 11/05/2023 0702   CO2 29 10/28/2023 1411   GLUCOSE 105 (H) 11/05/2023 0702   BUN 40 (H) 11/05/2023 0702   CREATININE 5.40 (H) 11/05/2023 0702   CALCIUM 8.7 (L) 10/28/2023 1411   CALCIUM 7.3 (L) 01/28/2023 0041   GFRNONAA 17 (L) 10/28/2023 1411   GFRAA 56 (L) 08/31/2019 1200    COAGS: Lab Results  Component Value Date   INR 1.3 (H) 01/22/2023   INR 1.29 10/24/2010   INR 1.17 09/17/2010      ASSESSMENT/PLAN: This is a 73 y.o. male with history malfunction.  After discussing risk and benefits of left-sided fistulogram, patient like to proceed.  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   Kayla Part MD MS [facility] [phone] 11/05/2023

## 2023-11-06 ENCOUNTER — Encounter (HOSPITAL_COMMUNITY): Payer: Self-pay | Admitting: Vascular Surgery

## 2024-03-01 ENCOUNTER — Other Ambulatory Visit (HOSPITAL_COMMUNITY): Payer: Self-pay | Admitting: Nephrology

## 2024-03-01 ENCOUNTER — Encounter (HOSPITAL_COMMUNITY): Payer: Self-pay | Admitting: Radiology

## 2024-03-01 DIAGNOSIS — N186 End stage renal disease: Secondary | ICD-10-CM

## 2024-03-01 NOTE — H&P (Signed)
 Chief Complaint: Patient was seen in consultation today for No chief complaint on file.  at the request of Lateef,Munsoor  Referring Physician(s): Lateef,Munsoor  Supervising Physician: Philip Cornet  Patient Status: [facility] - Out-pt  History of Present Illness: [name] is a 73 y.o. male Spanish speaking outpatient. History of DM, HLD, HTN, ESRD on HD via left upper arm brachio basilic V fistula placed on 7.9.24 with second stage basilic vein transposition perform on 9.23.25. Per EPIC  a fistulogram was performed by the cardiovascular Team due to prolonged bleeding. Per the procedure notes a stenosis was found at the proximal humerus and the area was angioplastied with an 8 mm balloon.  Team is reporting prolonged bleeding after dialysis. Request is for fistulogram with possible intervention.   Family at bedside. Patient interviewed with the assistance of tablet translator Alis at bedside  Currently without any significant complaints. Patient alert and laying in bed,calm. Denies any fevers, headache, chest pain, SOB, cough, abdominal pain, nausea, vomiting or bleeding.    No pertinent imaging. No recent labs. Patient is on eliquis . All other medications are within acceptable parameters. NKDA. Patient has been NPO since midnight.   Return precautions and treatment recommendations and follow-up discussed with the patient and his family. Both who are agreeable with the plan.    Past Medical History:  Diagnosis Date   Diabetes mellitus    ESRD on hemodialysis (HCC)    HLD (hyperlipidemia)    Hypertension     Past Surgical History:  Procedure Laterality Date   A/V FISTULAGRAM Left 11/05/2023   Procedure: A/V Fistulagram;  Surgeon: Lanis Fonda BRAVO, MD;  Location: [facility] INVASIVE CV LAB;  Service: Cardiovascular;  Laterality: Left;   APPENDECTOMY     ruptured appendix 13 years ago did not close wound   AV FISTULA PLACEMENT Left 01/28/2023   Procedure: LEFT ARM BRACHIO BASILIC  ARTERIOVENOUS (AV) FISTULA CREATION;  Surgeon: Lanis Fonda BRAVO, MD;  Location: [facility] OR;  Service: Vascular;  Laterality: Left;   BASCILIC VEIN TRANSPOSITION Left 04/14/2023   Procedure: LEFT ARM SECOND STAGE BASILIC VEIN TRANSPOSITION;  Surgeon: Lanis Fonda BRAVO, MD;  Location: [facility] OR;  Service: Vascular;  Laterality: Left;  with Regional Block   HERNIA REPAIR     11 yrs ago    IR FLUORO GUIDE CV LINE RIGHT  01/24/2023   IR FLUORO GUIDE CV LINE RIGHT  03/03/2023   IR REMOVAL TUN CV CATH W/O FL  06/12/2023   IR US  GUIDE VASC ACCESS RIGHT  01/24/2023   OPEN REDUCTION INTERNAL FIXATION (ORIF) DISTAL RADIAL FRACTURE Left 09/03/2019   Procedure: OPEN REDUCTION INTERNAL FIXATION (ORIF) DISTAL RADIAL FRACTURE;  Surgeon: Sebastian Lenis, MD;  Location: [facility];  Service: Orthopedics;  Laterality: Left;   UPPER EXTREMITY INTERVENTION  11/05/2023   Procedure: UPPER EXTREMITY INTERVENTION;  Surgeon: Lanis Fonda BRAVO, MD;  Location: [facility] INVASIVE CV LAB;  Service: Cardiovascular;;    Allergies: Patient has no known allergies.  Medications: Prior to Admission medications   Medication Sig Start Date End Date Taking? Authorizing Provider  amiodarone  (PACERONE ) 200 MG tablet Take 1 tablet (200 mg total) by mouth 2 (two) times daily for 21 days, THEN 1 tablet (200 mg total) daily. Follow with cardiology for refills. Patient taking differently: 1 tablet (200 mg total) daily. Follow with cardiology for refills. 05/25/23 11/05/23  Perri DELENA Meliton Mickey., MD  amLODipine  (NORVASC ) 5 MG tablet Take 5 mg by mouth daily. 10/31/23   [provider]  apixaban  (ELIQUIS ) 5 MG TABS tablet Take 1 tablet (5 mg total) by mouth 2 (two) times daily. 02/03/23   Rojelio Nest, DO  glipiZIDE  (GLUCOTROL ) 5 MG tablet Take 1 tablet (5 mg total) by mouth daily before breakfast. 02/03/23   Rojelio Nest, DO  midodrine  (PROAMATINE ) 10 MG tablet Take one tablet before dialysis to help keep your blood pressure up 05/20/23    Flint Sonny POUR, PA-C     Family History  Problem Relation Age of Onset   Heart attack Neg Hx     Social History   Socioeconomic History   Marital status: Married    Spouse name: Not on file   Number of children: 5   Years of education: Not on file   Highest education level: Not on file  Occupational History    Employer: UNEMPLOYED  Tobacco Use   Smoking status: Never   Smokeless tobacco: Never  Vaping Use   Vaping status: Never Used  Substance and Sexual Activity   Alcohol use: No   Drug use: No   Sexual activity: Not on file  Other Topics Concern   Not on file  Social History Narrative   Not on file   Social Drivers of Health   Financial Resource Strain: Low Risk  (10/18/2020)   Received from [facility]   Overall Financial Resource Strain (CARDIA)    Difficulty of Paying Living Expenses: Not hard at all  Food Insecurity: No Food Insecurity (05/23/2023)   Hunger Vital Sign    Worried About Running Out of Food in the Last Year: Never true    Ran Out of Food in the Last Year: Never true  Transportation Needs: No Transportation Needs (05/23/2023)   PRAPARE - Administrator, Civil Service (Medical): No    Lack of Transportation (Non-Medical): No  Physical Activity: Not on file  Stress: Not on file  Social Connections: Unknown (01/16/2023)   Received from [facility]   Social Network    Social Network: Not on file    Review of Systems: A 12 point ROS discussed and pertinent positives are indicated in the HPI above.  All other systems are negative.  Review of Systems  Constitutional:  Negative for fever.  HENT:  Negative for congestion.   Respiratory:  Negative for cough and shortness of breath.   Cardiovascular:  Negative for chest pain.  Gastrointestinal:  Negative for abdominal pain.  Neurological:  Negative for headaches.  Psychiatric/Behavioral:  Negative for behavioral problems and confusion.     Vital Signs: BP (!) 165/85   Pulse 73    Temp 98 F (36.7 C) (Oral)   Resp 17   Ht 6' (1.829 m)   Wt 213 lb 13.5 oz (97 kg)   SpO2 96%   BMI 29.00 kg/m   Advance Care Plan: The advanced care plan/surrogate decision maker was discussed at the time of visit and the patient did not wish to discuss or was not able to name a surrogate decision maker or provide an advance care plan.    Physical Exam Vitals and nursing note reviewed.  Constitutional:      Appearance: He is well-developed.  HENT:     Head: Normocephalic.     Mouth/Throat:     Mouth: Mucous membranes are dry.  Cardiovascular:     Comments: Left upper arm AVF +/+ Pulmonary:     Effort: Pulmonary effort is normal.  Musculoskeletal:  General: Normal range of motion.     Cervical back: Normal range of motion.  Skin:    General: Skin is dry.     Findings: Bruising (to AVF site) present.  Neurological:     General: No focal deficit present.     Mental Status: He is alert and oriented to person, place, and time. Mental status is at baseline.  Psychiatric:        Mood and Affect: Mood normal.        Behavior: Behavior normal.        Thought Content: Thought content normal.     Imaging: No results found.  Labs:  CBC: Recent Labs    05/23/23 1138 05/24/23 1400 05/25/23 0359 10/28/23 1411 11/05/23 0702  WBC 8.1 7.8 7.7 5.4  --   HGB 11.3* 9.5* 11.5* 12.5* 11.2*  HCT 34.6* 28.6* 33.7* 38.0* 33.0*  PLT 208 188 214 205  --     COAGS: No results for input(s): INR, APTT in the last 8760 hours.  BMP: Recent Labs    05/23/23 1138 05/24/23 1400 05/25/23 0359 10/28/23 1411 11/05/23 0702  NA 140 141 135 139 141  K 3.9 3.5 3.6 3.9 4.3  CL 102 103 94* 97* 103  CO2 26 26 27 29   --   GLUCOSE 136* 183* 111* 87 105*  BUN 46* 60* 33* 21 40*  CALCIUM  6.8* 6.5* 7.4* 8.7*  --   CREATININE 7.37* 8.11* 5.80* 3.56* 5.40*  GFRNONAA 7* 6* 10* 17*  --     LIVER FUNCTION TESTS: Recent Labs    05/20/23 0945 05/23/23 1138 05/24/23 1400  10/28/23 1411  BILITOT 0.5 0.6  --  0.5  AST 18 15  --  18  ALT 13 14  --  20  ALKPHOS 90 92  --  102  PROT 7.4 7.1  --  8.1  ALBUMIN 3.6 3.5 3.0* 3.9    TUMOR MARKERS: No results for input(s): AFPTM, CEA, CA199, CHROMGRNA in the last 8760 hours.  Assessment and Plan:  73 y.o. Spanish speaking male outpatient. History of DM, HLD, HTN, ESRD on HD via left upper arm brachio basilic V fistula placed on 7.9.24 with second stage basilic vein transposition perform on 9.23.25. Per EPIC  a fistulogram was performed by the cardiovascular Team due to prolonged bleeding. Per the procedure notes a stenosis was found at the proximal humerus and the area was angioplastied with an 8 mm balloon.  Team is reporting prolonged bleeding after dialysis. Request is for fistulogram with possible intervention.   PLAN: IR Image Guided Fistulogram with Possible Intervention.  Risks and benefits discussed with the patient including, but not limited to bleeding, infection, vascular injury, pulmonary embolism, need for tunneled HD catheter placement or even death.  All of the patient's questions were answered, patient is agreeable to proceed. Consent signed and in chart.   Thank you for this interesting consult.  I greatly enjoyed meeting [name] and look forward to participating in their care.  A copy of this report was sent to the requesting provider on this date.  Electronically Signed: Delon JAYSON Beagle, NP 03/02/2024, 10:54 AM   I spent a total of  30 Minutes   in face to face in clinical consultation, greater than 50% of which was counseling/coordinating care for fistulogram with possible intervention

## 2024-03-02 ENCOUNTER — Other Ambulatory Visit: Payer: Self-pay

## 2024-03-02 ENCOUNTER — Ambulatory Visit (HOSPITAL_COMMUNITY)
Admission: RE | Admit: 2024-03-02 | Discharge: 2024-03-02 | Disposition: A | Discharge status: Home / Self Care | Payer: MEDICAID | Source: Ambulatory Visit | Attending: Nephrology | Admitting: Nephrology

## 2024-03-02 ENCOUNTER — Other Ambulatory Visit (HOSPITAL_COMMUNITY): Payer: Self-pay | Admitting: Nephrology

## 2024-03-02 DIAGNOSIS — E1122 Type 2 diabetes mellitus with diabetic chronic kidney disease: Secondary | ICD-10-CM | POA: Insufficient documentation

## 2024-03-02 DIAGNOSIS — I120 Hypertensive chronic kidney disease with stage 5 chronic kidney disease or end stage renal disease: Secondary | ICD-10-CM | POA: Insufficient documentation

## 2024-03-02 DIAGNOSIS — T82858A Stenosis of vascular prosthetic devices, implants and grafts, initial encounter: Secondary | ICD-10-CM | POA: Insufficient documentation

## 2024-03-02 DIAGNOSIS — N186 End stage renal disease: Secondary | ICD-10-CM | POA: Insufficient documentation

## 2024-03-02 DIAGNOSIS — Z992 Dependence on renal dialysis: Secondary | ICD-10-CM | POA: Insufficient documentation

## 2024-03-02 DIAGNOSIS — Z7901 Long term (current) use of anticoagulants: Secondary | ICD-10-CM | POA: Insufficient documentation

## 2024-03-02 DIAGNOSIS — Z7984 Long term (current) use of oral hypoglycemic drugs: Secondary | ICD-10-CM | POA: Insufficient documentation

## 2024-03-02 DIAGNOSIS — Y832 Surgical operation with anastomosis, bypass or graft as the cause of abnormal reaction of the patient, or of later complication, without mention of misadventure at the time of the procedure: Secondary | ICD-10-CM | POA: Insufficient documentation

## 2024-03-02 LAB — GLUCOSE, CAPILLARY: Glucose-Capillary: 126 mg/dL — ABNORMAL HIGH (ref 70–99)

## 2024-03-02 MED ORDER — FENTANYL CITRATE (PF) 100 MCG/2ML IJ SOLN
INTRAMUSCULAR | Status: AC
  Filled 2024-03-02: qty 2

## 2024-03-02 MED ORDER — MIDAZOLAM HCL 2 MG/2ML IJ SOLN
INTRAMUSCULAR | Status: AC
  Filled 2024-03-02: qty 2

## 2024-03-02 MED ORDER — MIDAZOLAM HCL 2 MG/2ML IJ SOLN
INTRAMUSCULAR | Status: AC | PRN
Start: 2024-03-02 — End: 2024-03-02
  Administered 2024-03-02 (×4): .5 mg via INTRAVENOUS

## 2024-03-02 MED ORDER — IOHEXOL 300 MG/ML  SOLN
100.0000 mL | Freq: Once | INTRAMUSCULAR | Status: AC | PRN
  Administered 2024-03-02 (×2): 55 mL via INTRAVENOUS

## 2024-03-02 MED ORDER — FENTANYL CITRATE (PF) 100 MCG/2ML IJ SOLN
INTRAMUSCULAR | Status: AC | PRN
  Administered 2024-03-02 (×4): 25 ug via INTRAVENOUS

## 2024-03-02 MED ORDER — LIDOCAINE-EPINEPHRINE 1 %-1:100000 IJ SOLN
20.0000 mL | Freq: Once | INTRAMUSCULAR | Status: AC
  Administered 2024-03-02 (×2): 2.5 mL via INTRADERMAL

## 2024-03-02 MED ORDER — LIDOCAINE-EPINEPHRINE 1 %-1:100000 IJ SOLN
INTRAMUSCULAR | Status: AC
  Filled 2024-03-02: qty 1

## 2024-03-02 NOTE — Progress Notes (Signed)
 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  Treated with 7 mm and 8 mm balloons.  Stenosis resolved after balloon angioplasty  Complications: None     EBL: Minimal  Plan: Discharge in one hour.  Can remove suture at dialysis tomorrow.  Fistula is ready to be used.   Siriyah Ambrosius R. Philip, MD  Pager: [phone]  Patient ID: [name], male   DOB: [birth year], 73 y.o.   MRN: [id number]

## 2024-03-03 ENCOUNTER — Encounter (HOSPITAL_COMMUNITY): Payer: Self-pay

## 2024-03-03 ENCOUNTER — Other Ambulatory Visit (HOSPITAL_COMMUNITY): Payer: Self-pay | Admitting: Nephrology

## 2024-03-03 DIAGNOSIS — N186 End stage renal disease: Secondary | ICD-10-CM

## 2024-06-16 ENCOUNTER — Encounter (HOSPITAL_COMMUNITY): Payer: Self-pay | Admitting: Vascular Surgery

## 2024-06-25 ENCOUNTER — Encounter (HOSPITAL_COMMUNITY): Payer: Self-pay | Admitting: Vascular Surgery

## 2024-06-25 ENCOUNTER — Other Ambulatory Visit: Payer: Self-pay

## 2024-06-25 ENCOUNTER — Encounter (HOSPITAL_COMMUNITY): Admission: RE | Disposition: A | Payer: Self-pay | Source: Home / Self Care | Attending: Vascular Surgery

## 2024-06-25 ENCOUNTER — Ambulatory Visit (HOSPITAL_COMMUNITY)
Admission: RE | Admit: 2024-06-25 | Discharge: 2024-06-25 | Disposition: A | Payer: Self-pay | Attending: Vascular Surgery | Admitting: Vascular Surgery

## 2024-06-25 LAB — GLUCOSE, CAPILLARY: Glucose-Capillary: 142 mg/dL — ABNORMAL HIGH (ref 70–99)

## 2024-06-25 SURGERY — A/V FISTULAGRAM
Anesthesia: LOCAL | Site: Arm Upper | Laterality: Left

## 2024-06-25 MED ORDER — HEPARIN (PORCINE) IN NACL 1000-0.9 UT/500ML-% IV SOLN
INTRAVENOUS | Status: DC | PRN
  Administered 2024-06-25: 500 mL

## 2024-06-25 MED ORDER — LIDOCAINE HCL (PF) 1 % IJ SOLN
INTRAMUSCULAR | Status: AC
  Filled 2024-06-25: qty 30

## 2024-06-25 MED ORDER — IODIXANOL 320 MG/ML IV SOLN
INTRAVENOUS | Status: DC | PRN
  Administered 2024-06-25: 35 mL

## 2024-06-25 MED ORDER — LIDOCAINE HCL (PF) 1 % IJ SOLN
INTRAMUSCULAR | Status: DC | PRN
  Administered 2024-06-25 (×2): 2 mL via INTRADERMAL

## 2024-06-25 SURGICAL SUPPLY — 13 items
BALLOON MUSTANG 4.0X40 75 (BALLOONS) IMPLANT
BALLOON MUSTANG 8.0X40 75 (BALLOONS) IMPLANT
CATH ANGIO 5F BER2 65CM (CATHETERS) IMPLANT
DEVICE INFLATION ENCORE 26 (MISCELLANEOUS) IMPLANT
KIT MICROPUNCTURE NIT STIFF (SHEATH) IMPLANT
KIT PV (KITS) ×2 IMPLANT
SHEATH PINNACLE R/O II 6F 4CM (SHEATH) IMPLANT
SHEATH PROBE COVER 6X72 (BAG) IMPLANT
STENT VIABAHN 8X50X75 (Permanent Stent) IMPLANT
TRAY PV CATH (CUSTOM PROCEDURE TRAY) ×2 IMPLANT
TUBING CIL FLEX 10 FLL-RA (TUBING) IMPLANT
WIRE BENTSON .035X145CM (WIRE) IMPLANT
WIRE TORQFLEX AUST .018X40CM (WIRE) IMPLANT

## 2024-06-25 NOTE — Op Note (Signed)
    Patient name: [name] MRN: [id number] DOB: 17-Sep-1950 Sex: male  06/25/2024 Pre-operative Diagnosis: End-stage renal disease, malfunctioning left arm AV fistula with decreased clearance and pulling clots Post-operative diagnosis:  Same Surgeon:  Penne BROCKS. Sheree, MD Procedure Performed: 1.  Percutaneous ultrasound-guided cannulation left arm AV fistula in antegrade and retrograde directions 2.  Left upper extremity fistulogram 3.  Stent of basilic vein swing segment into axillary vein with 8 x 50 mm Viabahn postdilated with 8 mm balloon 4.  Balloon angioplasty proximal anastomosis and proximal basilic vein AV fistula with 4 mm balloon  Indications: 73 year old male with history of end-stage renal disease on dialysis via left arm AV fistula which has had difficulty with both clearance and pulling clots on dialysis with cannulation.  We have discussed his options and he agrees to proceed with fistulogram possible intervention.  Findings: The outflow of the fistula had approximate 95% stenosis where it joined the axillary vein and this was stented to 0% residual stenosis with 8 x 50 mm covered stent postdilated with an 8 mm balloon.  Proximally there is a smooth greater than 80% stenosis of the proximal fistula over a period of 5 cm and this was balloon angioplastied using 4 mm balloon to 0% residual stenosis.  At completion there was much improved flow through the fistula by palpation.  Fistula remains amenable to future percutaneous intervention however is high risk for requiring surgical intervention.   Procedure:  The patient was identified in the holding area and taken to the heart and vascular procedure room.  The patient was then placed supine on the table and prepped and draped in the usual sterile fashion.  A time out was called.  Ultrasound was used to evaluate the AV fistula near the arteriovenous anastomosis and this was noted to be quite sclerotic.  In the mid fistula we  were able to anesthetize the area and cannulated with micropuncture needle followed by wire and sheath.  An ultrasound images saved the permanent record.  We performed left upper extremity fistulogram with the above findings.  We placed a Bentson wire followed by a 6 French sheath and confirmed the outflow stenosis.  6 French sheath was removed and we primarily stented using an 8 x 50 mm Viabahn and the sheath was replaced and we postdilated with 8 x 40 mm balloon to nominal pressure.  Completion demonstrated no residual stenosis.  We performed retrograde imaging and demonstrated a very long segment stenosis from the arterial anastomosis through the proximal fistula.  We removed the existing sheath and wire and suture-ligated the cannulation site.  In a retrograde direction we then used ultrasound again in the mid fistula anesthetize the skin and cannulated micropuncture needle followed by wire and sheath.  Again an ultrasound image was saved from the record.  We placed a Bentson wire followed by a 6 French sheath.  Using a Kumpe catheter we were able to select the brachial artery and confirmed intraluminal access.  We then performed primarily balloon dilatation of the proximal fistula to nominal pressure with 2 inflations.  Completion demonstrated minimal residual stenosis if any and much improved flow through the proximal anastomosis and proximal fistula.  Satisfied with this the wire and sheath were removed and again we suture-ligated the cannulation site.  He tolerated the procedure without any complication.  Contrast: 35 cc  Azusena Erlandson C. Sheree, MD [facility] [place] Office: [phone] Pager: [phone]

## 2024-06-25 NOTE — H&P (Signed)
 H+P  History of Present Illness: This is a 73 y.o. male history of end-stage renal disease currently dialyzing via left upper arm basilic vein AV fistula.  He has undergone percutaneous intervention 8 months ago balloon of the swing segment of the fistula that was for increased bleeding after dialysis that required stitching.  He is now having prolonged dialysis times with decreased clearance and occasional pulling clots per the family.  He is here today to discuss options moving forward with his fistula.  Most information obtained via interpreter.  Past Medical History:  Diagnosis Date   Diabetes mellitus    ESRD on hemodialysis (HCC)    HLD (hyperlipidemia)    Hypertension     Past Surgical History:  Procedure Laterality Date   A/V FISTULAGRAM Left 11/05/2023   Procedure: A/V Fistulagram;  Surgeon: Lanis Fonda BRAVO, MD;  Location: [facility] INVASIVE CV LAB;  Service: Cardiovascular;  Laterality: Left;   APPENDECTOMY     ruptured appendix 13 years ago did not close wound   AV FISTULA PLACEMENT Left 01/28/2023   Procedure: LEFT ARM BRACHIO BASILIC ARTERIOVENOUS (AV) FISTULA CREATION;  Surgeon: Lanis Fonda BRAVO, MD;  Location: [facility] OR;  Service: Vascular;  Laterality: Left;   BASCILIC VEIN TRANSPOSITION Left 04/14/2023   Procedure: LEFT ARM SECOND STAGE BASILIC VEIN TRANSPOSITION;  Surgeon: Lanis Fonda BRAVO, MD;  Location: [facility] OR;  Service: Vascular;  Laterality: Left;  with Regional Block   HERNIA REPAIR     11 yrs ago    IR AV DIALY SHUNT INTRO NEEDLE/INTRACATH INITIAL W/PTA/IMG LEFT  03/02/2024   IR FLUORO GUIDE CV LINE RIGHT  01/24/2023   IR FLUORO GUIDE CV LINE RIGHT  03/03/2023   IR REMOVAL TUN CV CATH W/O FL  06/12/2023   IR US  GUIDE VASC ACCESS LEFT  03/03/2024   IR US  GUIDE VASC ACCESS RIGHT  01/24/2023   OPEN REDUCTION INTERNAL FIXATION (ORIF) DISTAL RADIAL FRACTURE Left 09/03/2019   Procedure: OPEN REDUCTION INTERNAL FIXATION (ORIF) DISTAL RADIAL FRACTURE;  Surgeon: Sebastian Lenis, MD;  Location: [facility];  Service: Orthopedics;  Laterality: Left;   UPPER EXTREMITY INTERVENTION  11/05/2023   Procedure: UPPER EXTREMITY INTERVENTION;  Surgeon: Lanis Fonda BRAVO, MD;  Location: [facility] INVASIVE CV LAB;  Service: Cardiovascular;;    No Known Allergies  Prior to Admission medications   Medication Sig Start Date End Date Taking? Authorizing Provider  amiodarone  (PACERONE ) 200 MG tablet Take 1 tablet (200 mg total) by mouth 2 (two) times daily for 21 days, THEN 1 tablet (200 mg total) daily. Follow with cardiology for refills. Patient taking differently: 1 tablet (200 mg total) daily. Follow with cardiology for refills. 05/25/23 03/02/24  Perri DELENA Meliton Mickey., MD  amLODipine  (NORVASC ) 5 MG tablet Take 5 mg by mouth daily. 10/31/23   [provider]  apixaban  (ELIQUIS ) 5 MG TABS tablet Take 1 tablet (5 mg total) by mouth 2 (two) times daily. 02/03/23   Rojelio Nest, DO  glipiZIDE  (GLUCOTROL ) 5 MG tablet Take 1 tablet (5 mg total) by mouth daily before breakfast. 02/03/23   Rojelio Nest, DO  midodrine  (PROAMATINE ) 10 MG tablet Take one tablet before dialysis to help keep your blood pressure up 05/20/23   Flint Sonny POUR, PA-C    Social History   Socioeconomic History   Marital status: Married    Spouse name: Not on file   Number of children: 5   Years of education: Not on file   Highest education level: Not  on file  Occupational History    Employer: UNEMPLOYED  Tobacco Use   Smoking status: Never   Smokeless tobacco: Never  Vaping Use   Vaping status: Never Used  Substance and Sexual Activity   Alcohol use: No   Drug use: No   Sexual activity: Not on file  Other Topics Concern   Not on file  Social History Narrative   Not on file   Social Drivers of Health   Financial Resource Strain: Low Risk (10/18/2020)   Received [facility]   Overall Financial Resource Strain (CARDIA)    Difficulty of Paying Living Expenses: Not hard  at all  Food Insecurity: No Food Insecurity (05/23/2023)   Hunger Vital Sign    Worried About Running Out of Food in the Last Year: Never true    Ran Out of Food in the Last Year: Never true  Transportation Needs: No Transportation Needs (05/23/2023)   PRAPARE - Administrator, Civil Service (Medical): No    Lack of Transportation (Non-Medical): No  Physical Activity: Not on file  Stress: Not on file  Social Connections: Unknown (01/16/2023)   Received [facility]   Social Network    Social Network: Not on file  Intimate Partner Violence: Not At Risk (05/23/2023)   Humiliation, Afraid, Rape, and Kick questionnaire    Fear of Current or Ex-Partner: No    Emotionally Abused: No    Physically Abused: No    Sexually Abused: No     Family History  Problem Relation Age of Onset   Heart attack Neg Hx     ROS: [x]  Positive   [ ]  Negative   [ ]  All sytems reviewed and are negative  Cardiovascular: []  chest pain/pressure []  palpitations []  SOB lying flat []  DOE []  pain in legs while walking []  pain in legs at rest []  pain in legs at night []  non-healing ulcers []  hx of DVT []  swelling in legs  Pulmonary: []  productive cough []  asthma/wheezing []  home O2  Neurologic: []  weakness in []  arms []  legs []  numbness in []  arms []  legs []  hx of CVA []  mini stroke [] difficulty speaking or slurred speech []  temporary loss of vision in one eye []  dizziness  Hematologic: []  hx of cancer []  bleeding problems []  problems with blood clotting easily  Endocrine:   []  diabetes []  thyroid disease  GI []  vomiting blood []  blood in stool  GU: []  CKD/renal failure []  HD--[]  M/W/F or []  T/T/S []  burning with urination [x]  left arm AV fistula not working properly for dialysis  Psychiatric: []  anxiety []  depression  Musculoskeletal: []  arthritis []  joint pain  Integumentary: []  rashes []  ulcers  Constitutional: []  fever []  chills   Physical  Examination  Vitals:   06/25/24 0756 06/25/24 0806  BP: (!) 148/91 (!) 141/84  Pulse: 72 68  Resp: 12 16  Temp: 97.7 F (36.5 C)   SpO2: 94% 95%   There is no height or weight on file to calculate BMI.  Physical Exam HENT:     Head: Normocephalic.     Nose: Nose normal.  Eyes:     Pupils: Pupils are equal, round, and reactive to light.  Cardiovascular:     Rate and Rhythm: Normal rate.  Pulmonary:     Effort: Pulmonary effort is normal.  Abdominal:     General: Abdomen is flat.  Musculoskeletal:     Cervical back: Normal range of motion.  Comments: Left arm AV fistula with weak pulsatility  Neurological:     Mental Status: He is alert.      CBC    Component Value Date/Time   WBC 5.4 10/28/2023 1411   RBC 3.88 (L) 10/28/2023 1411   HGB 11.2 (L) 11/05/2023 0702   HCT 33.0 (L) 11/05/2023 0702   PLT 205 10/28/2023 1411   MCV 97.9 10/28/2023 1411   MCH 32.2 10/28/2023 1411   MCHC 32.9 10/28/2023 1411   RDW 12.5 10/28/2023 1411   LYMPHSABS 1.7 10/28/2023 1411   MONOABS 0.4 10/28/2023 1411   EOSABS 0.1 10/28/2023 1411   BASOSABS 0.1 10/28/2023 1411    BMET    Component Value Date/Time   NA 141 11/05/2023 0702   K 4.3 11/05/2023 0702   CL 103 11/05/2023 0702   CO2 29 10/28/2023 1411   GLUCOSE 105 (H) 11/05/2023 0702   BUN 40 (H) 11/05/2023 0702   CREATININE 5.40 (H) 11/05/2023 0702   CALCIUM  8.7 (L) 10/28/2023 1411   CALCIUM  7.3 (L) 01/28/2023 0041   GFRNONAA 17 (L) 10/28/2023 1411   GFRAA 56 (L) 08/31/2019 1200    COAGS: Lab Results  Component Value Date   INR 1.3 (H) 01/22/2023   INR 1.29 10/24/2010   INR 1.17 09/17/2010     Non-Invasive Vascular Imaging:   No new studies  ASSESSMENT/PLAN: This is a 73 y.o. male with end-stage renal disease on dialysis via left arm AV fistula which by physical exam appears to have low flow and has had decreased clearance times on dialysis.  We discussed his options being continued watchful waiting versus  use of catheter versus proceeding with fistulogram possible intervention.  We discussed the risk benefits alternatives and he agrees to proceed in the presence of his family and via interpreter and he demonstrates good understanding and consent was signed.   Imanol Bihl C. Sheree, MD [facility] Office: [phone] Pager: [phone]

## 2024-08-20 ENCOUNTER — Ambulatory Visit (HOSPITAL_COMMUNITY)
Admission: RE | Admit: 2024-08-20 | Discharge: 2024-08-20 | Disposition: A | Discharge status: Home / Self Care | Payer: Self-pay | Attending: Vascular Surgery | Admitting: Vascular Surgery

## 2024-08-20 ENCOUNTER — Telehealth: Payer: Self-pay

## 2024-08-20 ENCOUNTER — Encounter (HOSPITAL_COMMUNITY)
Admission: RE | Disposition: A | Discharge status: Home / Self Care | Payer: Self-pay | Source: Home / Self Care | Attending: Vascular Surgery

## 2024-08-20 ENCOUNTER — Other Ambulatory Visit: Payer: Self-pay

## 2024-08-20 DIAGNOSIS — I120 Hypertensive chronic kidney disease with stage 5 chronic kidney disease or end stage renal disease: Secondary | ICD-10-CM | POA: Insufficient documentation

## 2024-08-20 DIAGNOSIS — E1122 Type 2 diabetes mellitus with diabetic chronic kidney disease: Secondary | ICD-10-CM | POA: Insufficient documentation

## 2024-08-20 DIAGNOSIS — T82858A Stenosis of vascular prosthetic devices, implants and grafts, initial encounter: Secondary | ICD-10-CM | POA: Insufficient documentation

## 2024-08-20 DIAGNOSIS — Z992 Dependence on renal dialysis: Secondary | ICD-10-CM | POA: Insufficient documentation

## 2024-08-20 DIAGNOSIS — Z7984 Long term (current) use of oral hypoglycemic drugs: Secondary | ICD-10-CM | POA: Insufficient documentation

## 2024-08-20 DIAGNOSIS — Z560 Unemployment, unspecified: Secondary | ICD-10-CM | POA: Insufficient documentation

## 2024-08-20 DIAGNOSIS — I871 Compression of vein: Secondary | ICD-10-CM | POA: Insufficient documentation

## 2024-08-20 DIAGNOSIS — N186 End stage renal disease: Secondary | ICD-10-CM | POA: Insufficient documentation

## 2024-08-20 DIAGNOSIS — Y832 Surgical operation with anastomosis, bypass or graft as the cause of abnormal reaction of the patient, or of later complication, without mention of misadventure at the time of the procedure: Secondary | ICD-10-CM | POA: Insufficient documentation

## 2024-08-20 LAB — GLUCOSE, CAPILLARY: Glucose-Capillary: 154 mg/dL — ABNORMAL HIGH (ref 70–99)

## 2024-08-20 MED ORDER — LIDOCAINE HCL (PF) 1 % IJ SOLN
INTRAMUSCULAR | Status: AC
  Filled 2024-08-20: qty 30

## 2024-08-20 MED ORDER — LIDOCAINE HCL (PF) 1 % IJ SOLN
INTRAMUSCULAR | Status: DC | PRN
  Administered 2024-08-20: 2 mL via INTRADERMAL

## 2024-08-20 MED ORDER — HEPARIN SODIUM (PORCINE) 1000 UNIT/ML IJ SOLN
INTRAMUSCULAR | Status: AC
  Filled 2024-08-20: qty 10

## 2024-08-20 MED ORDER — HEPARIN (PORCINE) IN NACL 1000-0.9 UT/500ML-% IV SOLN
INTRAVENOUS | Status: DC | PRN
  Administered 2024-08-20: 500 mL

## 2024-08-20 MED ORDER — IODIXANOL 320 MG/ML IV SOLN
INTRAVENOUS | Status: DC | PRN
  Administered 2024-08-20: 30 mL via INTRAVENOUS

## 2024-08-20 NOTE — Op Note (Signed)
" ° ° °  Patient name: [name] MRN: [id number] DOB: 08/08/1950 Sex: male  08/20/2024 Pre-operative Diagnosis: Malfunctioning left arm basilic vein fistula with ESRD Post-operative diagnosis:  Same Surgeon:  Lonni DOROTHA Gaskins, MD Procedure Performed: 1.  Ultrasound-guided access left arm AV fistula 2.  Left upper extremity fistulogram including central venogram 3.  Retrograde access of left upper arm AV fistula 4.  Angioplasty of basilic vein adjacent to the arterial anastomosis including arterial anastomosis (5 mm Mustang)  Indications: Patient is a 74 year old male with end-stage renal disease using a left brachiobasilic fistula.  This has had prior invention.  He presents due to malfunction of the fistula for fistulogram and possible invention after risks benefits discussed.  Findings:   Left upper extremity fistulogram showed no evidence of central venous stenosis.  The stent in the basilic vein swing segment was widely patent.  There was diffuse disease in the basilic vein with moderate to high-grade stenosis around 80% around the arterial anastomosis.  I then accessed the fistula retrograde and got across the arterial anastomosis and this segment of the vein including the arterial anastomosis was treated with a 5 mm Mustang.  Much better thrill.  No significant residual stenosis.   Procedure:  The patient was identified in the holding area and taken to Cath Lab.  Placed on the table in supine position.  Left arm was prepped draped standard sterile fashion.  Timeout was performed.  Initially evaluated the fistula ultrasound was patent and image was saved.  This was then injected with 1% lidocaine  without epinephrine .  I then accessed the fistula antegrade with micro access needle placed a micro and a micro sheath.  We then got fistulogram including central venogram.  We also got a retrograde shot.  It appeared that the disease was adjacent to the arterial anastomosis.  I then remove  the micro sheath and tied down the 4-0 Monocryl.  I then reaccessed the fistula retrograde again with ultrasound guidance placed a microwire micro sheath and then used a Glidewire to get across the arterial anastomosis upsized to a 6 French sheath.  I then treated the arterial anastomosis and the diseased basilic vein adjacent to this with a 5 mm Mustang for 2 minutes.  Much better thrill.  No significant residual stenosis.  Good flow down the artery.  Wires and catheters removed and pursestring was tied around the sheath.  Taken to holding in stable condition.  Lonni DOROTHA Gaskins, MD [facility] [place] Office: [phone]   "

## 2024-08-20 NOTE — Telephone Encounter (Signed)
 Appt Request:  -Daughter Almarie called and LM on Triage nurse line requesting to schedule appt to set up a new fistula. -Reviewed chart / spoke to MD -procedure performed today -Confirmed understanding of the plan of care for her dad's HD access which is to continue using the current fistula. If another problem arises, he will likely need a TDC and set up a surgery to create a new fistula.   -she explained that they were just trying to avoid having a TDC catheter but she understands

## 2024-08-20 NOTE — H&P (Signed)
 " H&P      MRN #:  [id number]  History of Present Illness: This is a 74 y.o. male with ESRD that presents for possible thrombectomy of left basilic vein fistula.  Family states it only worked for 1 hour yesterday.  Recently underwent stenting of the venous outflow by Dr. Sheree in December with angioplasty near the arterial anastomosis.  Family states that work several months after this.  Past Medical History:  Diagnosis Date   Diabetes mellitus    ESRD on hemodialysis (HCC)    HLD (hyperlipidemia)    Hypertension     Past Surgical History:  Procedure Laterality Date   A/V FISTULAGRAM Left 11/05/2023   Procedure: A/V Fistulagram;  Surgeon: Lanis Fonda BRAVO, MD;  Location: [facility] INVASIVE CV LAB;  Service: Cardiovascular;  Laterality: Left;   A/V FISTULAGRAM Left 06/25/2024   Procedure: A/V Fistulagram;  Surgeon: Sheree Penne Bruckner, MD;  Location: [facility] PV LAB;  Service: Cardiovascular;  Laterality: Left;   APPENDECTOMY     ruptured appendix 13 years ago did not close wound   AV FISTULA PLACEMENT Left 01/28/2023   Procedure: LEFT ARM BRACHIO BASILIC ARTERIOVENOUS (AV) FISTULA CREATION;  Surgeon: Lanis Fonda BRAVO, MD;  Location: [facility] OR;  Service: Vascular;  Laterality: Left;   BASCILIC VEIN TRANSPOSITION Left 04/14/2023   Procedure: LEFT ARM SECOND STAGE BASILIC VEIN TRANSPOSITION;  Surgeon: Lanis Fonda BRAVO, MD;  Location: [facility] OR;  Service: Vascular;  Laterality: Left;  with Regional Block   HERNIA REPAIR     11 yrs ago    IR AV DIALY SHUNT INTRO NEEDLE/INTRACATH INITIAL W/PTA/IMG LEFT  03/02/2024   IR FLUORO GUIDE CV LINE RIGHT  01/24/2023   IR FLUORO GUIDE CV LINE RIGHT  03/03/2023   IR REMOVAL TUN CV CATH W/O FL  06/12/2023   IR US  GUIDE VASC ACCESS LEFT  03/03/2024   IR US  GUIDE VASC ACCESS RIGHT  01/24/2023   OPEN REDUCTION INTERNAL FIXATION (ORIF) DISTAL RADIAL FRACTURE Left 09/03/2019   Procedure: OPEN REDUCTION INTERNAL FIXATION (ORIF) DISTAL RADIAL FRACTURE;  Surgeon:  Sebastian Lenis, MD;  Location: [facility];  Service: Orthopedics;  Laterality: Left;   UPPER EXTREMITY INTERVENTION  11/05/2023   Procedure: UPPER EXTREMITY INTERVENTION;  Surgeon: Lanis Fonda BRAVO, MD;  Location: [facility] INVASIVE CV LAB;  Service: Cardiovascular;;   VENOUS STENT  06/25/2024   Procedure: VENOUS STENT;  Surgeon: Sheree Penne Bruckner, MD;  Location: [facility] PV LAB;  Service: Cardiovascular;;  innominate 95% anastomosis angioplasty    Allergies[1]  Prior to Admission medications  Medication Sig Start Date End Date Taking? Authorizing Provider  amiodarone  (PACERONE ) 200 MG tablet Take 1 tablet (200 mg total) by mouth 2 (two) times daily for 21 days, THEN 1 tablet (200 mg total) daily. Follow with cardiology for refills. Patient taking differently: 1 tablet (200 mg total) daily. Follow with cardiology for refills. 05/25/23 03/02/24  Perri DELENA Meliton Mickey., MD  amLODipine  (NORVASC ) 5 MG tablet Take 5 mg by mouth daily. 10/31/23   [provider]  apixaban  (ELIQUIS ) 5 MG TABS tablet Take 1 tablet (5 mg total) by mouth 2 (two) times daily. 02/03/23   Rojelio Nest, DO  glipiZIDE  (GLUCOTROL ) 5 MG tablet Take 1 tablet (5 mg total) by mouth daily before breakfast. 02/03/23   Rojelio Nest, DO  midodrine  (PROAMATINE ) 10 MG tablet Take one tablet before dialysis to help keep your blood pressure up 05/20/23   Flint Sonny POUR, PA-C    Social History  Socioeconomic History   Marital status: Married    Spouse name: Not on file   Number of children: 5   Years of education: Not on file   Highest education level: Not on file  Occupational History    Employer: UNEMPLOYED  Tobacco Use   Smoking status: Never   Smokeless tobacco: Never  Vaping Use   Vaping status: Never Used  Substance and Sexual Activity   Alcohol use: No   Drug use: No   Sexual activity: Not on file  Other Topics Concern   Not on file  Social History Narrative   Not on file   Social Drivers of  Health   Tobacco Use: Low Risk (02/28/2024)   Received from [facility]   Patient History    Smoking Tobacco Use: Never    Smokeless Tobacco Use: Never    Passive Exposure: Not on file  Financial Resource Strain: Not on file  Food Insecurity: No Food Insecurity (05/23/2023)   Hunger Vital Sign    Worried About Running Out of Food in the Last Year: Never true    Ran Out of Food in the Last Year: Never true  Transportation Needs: No Transportation Needs (05/23/2023)   PRAPARE - Administrator, Civil Service (Medical): No    Lack of Transportation (Non-Medical): No  Physical Activity: Not on file  Stress: Not on file  Social Connections: Unknown (01/16/2023)   Received from [facility]   Social Network    Social Network: Not on file  Intimate Partner Violence: Not At Risk (05/23/2023)   Humiliation, Afraid, Rape, and Kick questionnaire    Fear of Current or Ex-Partner: No    Emotionally Abused: No    Physically Abused: No    Sexually Abused: No  Depression (PHQ2-9): Not on file  Alcohol Screen: Not on file  Housing: Low Risk (05/23/2023)   Housing    Last Housing Risk Score: 0  Utilities: Not At Risk (05/23/2023)   AHC Utilities    Threatened with loss of utilities: No  Health Literacy: Not on file     Family History  Problem Relation Age of Onset   Heart attack Neg Hx     ROS: [x]  Positive   [ ]  Negative   [ ]  All sytems reviewed and are negative  Cardiovascular: []  chest pain/pressure []  palpitations []  SOB lying flat []  DOE []  pain in legs while walking []  pain in legs at rest []  pain in legs at night []  non-healing ulcers []  hx of DVT []  swelling in legs  Pulmonary: []  productive cough []  asthma/wheezing []  home O2  Neurologic: []  weakness in []  arms []  legs []  numbness in []  arms []  legs []  hx of CVA []  mini stroke [] difficulty speaking or slurred speech []  temporary loss of vision in one eye []  dizziness  Hematologic: []  hx of  cancer []  bleeding problems []  problems with blood clotting easily  Endocrine:   []  diabetes []  thyroid disease  GI []  vomiting blood []  blood in stool  GU: []  CKD/renal failure []  HD--[]  M/W/F or []  T/T/S []  burning with urination []  blood in urine  Psychiatric: []  anxiety []  depression  Musculoskeletal: []  arthritis []  joint pain  Integumentary: []  rashes []  ulcers  Constitutional: []  fever []  chills   Physical Examination  Vitals:   08/20/24 0717  BP: (!) 151/80  Pulse: 67  Resp: 14  SpO2: 95%   There is no height or weight on file  to calculate BMI.  General:  WDWN in NAD Gait: Not observed HENT: WNL, normocephalic Pulmonary: normal non-labored breathing Cardiac: regular, without  Murmurs, rubs or gallops Vascular Exam/Pulses: Left basilic vein fistula with thrill  CBC    Component Value Date/Time   WBC 5.4 10/28/2023 1411   RBC 3.88 (L) 10/28/2023 1411   HGB 11.2 (L) 11/05/2023 0702   HCT 33.0 (L) 11/05/2023 0702   PLT 205 10/28/2023 1411   MCV 97.9 10/28/2023 1411   MCH 32.2 10/28/2023 1411   MCHC 32.9 10/28/2023 1411   RDW 12.5 10/28/2023 1411   LYMPHSABS 1.7 10/28/2023 1411   MONOABS 0.4 10/28/2023 1411   EOSABS 0.1 10/28/2023 1411   BASOSABS 0.1 10/28/2023 1411    BMET    Component Value Date/Time   NA 141 11/05/2023 0702   K 4.3 11/05/2023 0702   CL 103 11/05/2023 0702   CO2 29 10/28/2023 1411   GLUCOSE 105 (H) 11/05/2023 0702   BUN 40 (H) 11/05/2023 0702   CREATININE 5.40 (H) 11/05/2023 0702   CALCIUM  8.7 (L) 10/28/2023 1411   CALCIUM  7.3 (L) 01/28/2023 0041   GFRNONAA 17 (L) 10/28/2023 1411   GFRAA 56 (L) 08/31/2019 1200    COAGS: Lab Results  Component Value Date   INR 1.3 (H) 01/22/2023   INR 1.29 10/24/2010   INR 1.17 09/17/2010     Non-Invasive Vascular Imaging:      ASSESSMENT/PLAN: This is a 74 y.o. male with ESRD that presents for possible thrombectomy of left basilic vein fistula.  Family states it  only worked for 1 hour yesterday.  Recently underwent stenting of the venous outflow by Dr. Sheree in December with angioplasty near the arterial anastomosis.  Family states that work several months after this.  The fistula has a thrill.  Discussed no need for thrombectomy.  Will pursue fistulogram with possible intervention including angioplasty and stenting.  If this ultimately fails will require TDC and new access in the future.  Lonni DOROTHA Gaskins, MD [facility]: [phone]     [1] No Known Allergies  "

## 2024-08-21 ENCOUNTER — Encounter (HOSPITAL_COMMUNITY): Payer: Self-pay | Admitting: Vascular Surgery
